# Patient Record
Sex: Male | Born: 1955
Health system: Southern US, Community
[De-identification: ages and names within clinical notes are randomized; demographics above are authoritative.]

## PROBLEM LIST (undated history)

## (undated) DIAGNOSIS — E119 Type 2 diabetes mellitus without complications: Secondary | ICD-10-CM

## (undated) DIAGNOSIS — I509 Heart failure, unspecified: Secondary | ICD-10-CM

## (undated) DIAGNOSIS — I10 Essential (primary) hypertension: Secondary | ICD-10-CM

## (undated) DIAGNOSIS — N200 Calculus of kidney: Secondary | ICD-10-CM

## (undated) HISTORY — DX: Essential (primary) hypertension: I10

## (undated) HISTORY — DX: Type 2 diabetes mellitus without complications: E11.9

## (undated) HISTORY — DX: Heart failure, unspecified: I50.9

---

## 2011-08-18 ENCOUNTER — Encounter: Payer: Self-pay | Admitting: Emergency Medicine

## 2011-08-18 ENCOUNTER — Emergency Department (HOSPITAL_COMMUNITY)
Admission: EM | Admit: 2011-08-18 | Discharge: 2011-08-18 | Disposition: A | Discharge status: Home / Self Care | Payer: Self-pay | Attending: Emergency Medicine | Admitting: Emergency Medicine

## 2011-08-18 DIAGNOSIS — Z87442 Personal history of urinary calculi: Secondary | ICD-10-CM | POA: Insufficient documentation

## 2011-08-18 DIAGNOSIS — R112 Nausea with vomiting, unspecified: Secondary | ICD-10-CM | POA: Insufficient documentation

## 2011-08-18 DIAGNOSIS — N23 Unspecified renal colic: Secondary | ICD-10-CM | POA: Insufficient documentation

## 2011-08-18 DIAGNOSIS — R10819 Abdominal tenderness, unspecified site: Secondary | ICD-10-CM | POA: Insufficient documentation

## 2011-08-18 DIAGNOSIS — R109 Unspecified abdominal pain: Secondary | ICD-10-CM | POA: Insufficient documentation

## 2011-08-18 HISTORY — DX: Calculus of kidney: N20.0

## 2011-08-18 LAB — URINALYSIS, ROUTINE W REFLEX MICROSCOPIC
Ketones, ur: 15 mg/dL — AB
Nitrite: NEGATIVE
Protein, ur: NEGATIVE mg/dL
Urobilinogen, UA: 0.2 mg/dL (ref 0.0–1.0)
pH: 5.5 (ref 5.0–8.0)

## 2011-08-18 MED ORDER — ONDANSETRON 8 MG PO TBDP: ORAL_TABLET | ORAL | Status: AC

## 2011-08-18 MED ORDER — KETOROLAC TROMETHAMINE 60 MG/2ML IM SOLN
60.0000 mg | Freq: Once | INTRAMUSCULAR | Status: AC
  Administered 2011-08-18: 60 mg via INTRAMUSCULAR
  Filled 2011-08-18: qty 2

## 2011-08-18 MED ORDER — HYDROMORPHONE HCL 4 MG PO TABS: 4.0000 mg | ORAL_TABLET | ORAL | Status: AC | PRN

## 2011-08-18 MED ORDER — MORPHINE SULFATE 10 MG/ML IJ SOLN
10.0000 mg | Freq: Once | INTRAMUSCULAR | Status: AC
  Administered 2011-08-18: 10 mg via INTRAMUSCULAR
  Filled 2011-08-18: qty 1

## 2011-08-18 MED ORDER — METOCLOPRAMIDE HCL 10 MG PO TABS: 10.0000 mg | ORAL_TABLET | Freq: Four times a day (QID) | ORAL | Status: AC | PRN

## 2011-08-18 MED ORDER — ONDANSETRON 4 MG PO TBDP
8.0000 mg | ORAL_TABLET | Freq: Once | ORAL | Status: AC
  Administered 2011-08-18: 8 mg via ORAL
  Filled 2011-08-18: qty 2

## 2011-08-18 NOTE — ED Notes (Signed)
Pt. Stated, I've had the worse pain on my lt. Side and I know its a kidney stone

## 2011-08-18 NOTE — ED Provider Notes (Signed)
History     CSN: [id number]  Arrival date & time 08/18/11  1312   First MD Initiated Contact with Patient 08/18/11 1622      Chief Complaint  Patient presents with  . Abdominal Pain    (Consider location/radiation/quality/duration/timing/severity/associated sxs/prior treatment) HPI This 56 year old male has about 12 hours of left flank and left lower quadrant abdominal pain with a colicky severe pain sensation just like prior kidney stones. He says nausea and vomiting but does not feel dehydrated. He is no fever chest pain cough or shortness of breath no testicular pain. He has no pain in his arms or legs no weakness or numbness. This feels just like his prior kidney stones. There is no treatment prior to arrival. Past Medical History  Diagnosis Date  . Kidney calculi     No past surgical history on file.  No family history on file.  History  Substance Use Topics  . Smoking status: Not on file  . Smokeless tobacco: Not on file  . Alcohol Use:       Review of Systems  Constitutional: Negative for fever.       10 Systems reviewed and are negative for acute change except as noted in the HPI.  HENT: Negative for congestion.   Eyes: Negative for discharge and redness.  Respiratory: Negative for cough and shortness of breath.   Cardiovascular: Negative for chest pain.  Gastrointestinal: Positive for nausea, vomiting and abdominal pain. Negative for diarrhea.  Genitourinary: Positive for flank pain. Negative for dysuria and hematuria.  Musculoskeletal: Negative for back pain.  Skin: Negative for rash.  Neurological: Negative for syncope, numbness and headaches.  Psychiatric/Behavioral:       No behavior change.    Allergies  Codeine and Erythromycin  Home Medications   Current Outpatient Rx  Name Route Sig Dispense Refill  . HYDROMORPHONE HCL 4 MG PO TABS Oral Take 1 tablet (4 mg total) by mouth every 4 (four) hours as needed for pain. 10 tablet 0  . METOCLOPRAMIDE  HCL 10 MG PO TABS Oral Take 1 tablet (10 mg total) by mouth every 6 (six) hours as needed (nausea/headache). 6 tablet 0  . ONDANSETRON 8 MG PO TBDP  8mg  ODT q4 hours prn nausea 4 tablet 0    BP 154/99  Pulse 76  Temp(Src) 97.9 F (36.6 C) (Oral)  Resp 18  SpO2 96%  Physical Exam  Nursing note and vitals reviewed. Constitutional:       Awake, alert, nontoxic appearance.  HENT:  Head: Atraumatic.  Eyes: Right eye exhibits no discharge. Left eye exhibits no discharge.  Neck: Neck supple.  Cardiovascular: Normal rate and regular rhythm.   No murmur heard. Pulmonary/Chest: Effort normal and breath sounds normal. No respiratory distress. He has no wheezes. He has no rales. He exhibits no tenderness.  Abdominal: Soft. Bowel sounds are normal. There is tenderness. There is no rebound.       Mild left lower quadrant tenderness without rebound and mild left CVA tenderness  Musculoskeletal: He exhibits no edema and no tenderness.       Baseline ROM, no obvious new focal weakness.  Neurological:       Mental status and motor strength appears baseline for patient and situation.  Skin: No rash noted.  Psychiatric: He has a normal mood and affect.    ED Course  Procedures (including critical care time)  Labs Reviewed  URINALYSIS, ROUTINE W REFLEX MICROSCOPIC - Abnormal; Notable for the following:  Specific Gravity, Urine 1.036 (*)    Glucose, UA 100 (*)    Hgb urine dipstick LARGE (*)    Bilirubin Urine SMALL (*)    Ketones, ur 15 (*)    Leukocytes, UA TRACE (*)    All other components within normal limits  URINE MICROSCOPIC-ADD ON   No results found.   1. Renal colic       MDM  Pt feels improved after observation and/or treatment in ED.Patient / Family / Caregiver informed of clinical course, understand medical decision-making process, and agree with plan.I doubt any other EMC precluding discharge at this time including, but not necessarily limited to the following:SBI,  AAA.        Hurman Horn, MD 08/18/11 [phone]

## 2011-08-19 ENCOUNTER — Encounter (HOSPITAL_COMMUNITY): Payer: Self-pay | Admitting: Emergency Medicine

## 2011-08-22 ENCOUNTER — Ambulatory Visit (HOSPITAL_COMMUNITY)
Admission: RE | Admit: 2011-08-22 | Discharge: 2011-08-22 | Disposition: A | Discharge status: Home / Self Care | Payer: Self-pay | Source: Ambulatory Visit | Attending: Urology | Admitting: Urology

## 2011-08-22 ENCOUNTER — Other Ambulatory Visit (HOSPITAL_COMMUNITY): Payer: Self-pay | Admitting: Urology

## 2011-08-22 DIAGNOSIS — N200 Calculus of kidney: Secondary | ICD-10-CM

## 2011-08-22 DIAGNOSIS — K8020 Calculus of gallbladder without cholecystitis without obstruction: Secondary | ICD-10-CM | POA: Insufficient documentation

## 2011-08-22 DIAGNOSIS — N1330 Unspecified hydronephrosis: Secondary | ICD-10-CM | POA: Insufficient documentation

## 2011-08-22 DIAGNOSIS — R109 Unspecified abdominal pain: Secondary | ICD-10-CM | POA: Insufficient documentation

## 2011-08-22 DIAGNOSIS — K5730 Diverticulosis of large intestine without perforation or abscess without bleeding: Secondary | ICD-10-CM | POA: Insufficient documentation

## 2011-08-22 DIAGNOSIS — R110 Nausea: Secondary | ICD-10-CM | POA: Insufficient documentation

## 2011-08-22 DIAGNOSIS — K449 Diaphragmatic hernia without obstruction or gangrene: Secondary | ICD-10-CM | POA: Insufficient documentation

## 2011-08-22 DIAGNOSIS — N201 Calculus of ureter: Secondary | ICD-10-CM | POA: Insufficient documentation

## 2011-08-22 DIAGNOSIS — R319 Hematuria, unspecified: Secondary | ICD-10-CM | POA: Insufficient documentation

## 2011-11-15 ENCOUNTER — Ambulatory Visit: Payer: Self-pay | Admitting: Emergency Medicine

## 2011-11-15 VITALS — BP 151/88 | HR 77 | Temp 98.2°F | Resp 18 | Ht 67.5 in | Wt 192.6 lb

## 2011-11-15 DIAGNOSIS — N200 Calculus of kidney: Secondary | ICD-10-CM

## 2011-11-15 DIAGNOSIS — K115 Sialolithiasis: Secondary | ICD-10-CM

## 2011-11-15 DIAGNOSIS — R300 Dysuria: Secondary | ICD-10-CM

## 2011-11-15 LAB — POCT URINALYSIS DIPSTICK
Bilirubin, UA: NEGATIVE
Glucose, UA: NEGATIVE
Ketones, UA: NEGATIVE
Spec Grav, UA: 1.02
pH, UA: 6

## 2011-11-15 LAB — POCT UA - MICROSCOPIC ONLY
Casts, Ur, LPF, POC: NEGATIVE
Crystals, Ur, HPF, POC: NEGATIVE
Mucus, UA: NEGATIVE

## 2011-11-15 LAB — COMPREHENSIVE METABOLIC PANEL
ALT: 23 U/L (ref 0–53)
Albumin: 4.4 g/dL (ref 3.5–5.2)
Alkaline Phosphatase: 80 U/L (ref 39–117)
Glucose, Bld: 118 mg/dL — ABNORMAL HIGH (ref 70–99)
Potassium: 4.1 mEq/L (ref 3.5–5.3)
Sodium: 137 mEq/L (ref 135–145)
Total Bilirubin: 1.5 mg/dL — ABNORMAL HIGH (ref 0.3–1.2)
Total Protein: 6.7 g/dL (ref 6.0–8.3)

## 2011-11-15 LAB — POCT CBC
Granulocyte percent: 61.5 %G (ref 37–80)
HCT, POC: 50.1 % (ref 43.5–53.7)
Hemoglobin: 17 g/dL (ref 14.1–18.1)
MCHC: 33.9 g/dL (ref 31.8–35.4)
MPV: 8.7 fL (ref 0–99.8)
POC Granulocyte: 4.2 (ref 2–6.9)
POC LYMPH PERCENT: 31.3 %L (ref 10–50)
POC MID %: 7.2 %M (ref 0–12)
RDW, POC: 13.5 %

## 2011-11-15 MED ORDER — DOXYCYCLINE HYCLATE 100 MG PO TABS: 100.0000 mg | ORAL_TABLET | Freq: Two times a day (BID) | ORAL | Status: AC

## 2011-11-15 NOTE — Progress Notes (Signed)
  Subjective:    Patient ID: Kyle Ramsey, male    DOB: January 12, 1956, 56 y.o.   MRN: [id number]  HPI patient presents with onset last night of severe swelling in the right side of his neck. He cerevisiae and then developed acute onset of severe swelling. This was associated with pain. It may be slightly less swollen today but still uncomfortable.    Review of Systems pertinent history reveals the patient has a history of kidney stones. He's been seen by the urologist for this.     Objective:   Physical Exam  Constitutional: He appears well-developed and well-nourished.  HENT:       The TMs are normal. The right salivary gland is significantly enlarged  and tender. Underneath the tongue appears normal. No stone was palpable  Neck: No JVD present. No tracheal deviation present. No thyromegaly present.  Cardiovascular: Normal rate and regular rhythm.   Pulmonary/Chest: No respiratory distress. He has no wheezes. He has no rales. He exhibits no tenderness.  Genitourinary: Rectum normal and prostate normal.  Lymphadenopathy:    He has no cervical adenopathy.          Assessment & Plan:   Patient here with urinary symptoms as well as a stone in the right submaxillary gland. We'll need to check a serum calcium because of the possibility of hyperparathyroidism.

## 2011-11-15 NOTE — Patient Instructions (Signed)
Salivary Stone  Your exam shows you have a stone in one of your saliva glands. These small stones form around a mucous plug in the ducts of the glands and cause the saliva in the gland to be blocked. This makes the gland swollen and painful, especially when you eat. If repeated episodes occur, the gland can become infected. Sometimes these stones can be seen on x-ray.  Treatment includes stimulating the production of saliva to push the stone out. You should suck on a lemon or sour candies several times daily. Antibiotic medicine may be needed if the gland is infected. Increasing fluids, applying warm compresses to the swollen area 3-4 times daily, and massaging the gland from back to front may encourage drainage and passage of the stone.  Surgical treatment to remove the stone is sometimes necessary, so proper medical follow up is very important. Call your doctor for an appointment as recommended. Call right away if you have a high fever, severe headache, vomiting, uncontrolled pain, or other serious symptoms.  Document Released: 09/06/2004 Document Revised: 07/19/2011 Document Reviewed: 07/30/2005  Exit[employer] Patient Information 2012 [employer].

## 2011-11-16 LAB — GC/CHLAMYDIA PROBE AMP, URINE
Chlamydia, Swab/Urine, PCR: NEGATIVE
GC Probe Amp, Urine: NEGATIVE

## 2011-11-16 LAB — PTH, INTACT AND CALCIUM: Calcium, Total (PTH): 9.3 mg/dL (ref 8.4–10.5)

## 2011-11-17 ENCOUNTER — Other Ambulatory Visit: Payer: Self-pay | Admitting: Emergency Medicine

## 2011-11-17 DIAGNOSIS — K115 Sialolithiasis: Secondary | ICD-10-CM

## 2011-11-18 ENCOUNTER — Telehealth: Payer: Self-pay

## 2011-11-18 NOTE — Telephone Encounter (Signed)
Dr.Daub: Pt would like to let you know that his salvia gland stone came out this yesterday.

## 2011-11-19 NOTE — Telephone Encounter (Signed)
Call that is great no further treatment necessary

## 2011-11-19 NOTE — Telephone Encounter (Signed)
LMOM for pt with Dr Ellis Parents message

## 2012-02-18 ENCOUNTER — Telehealth: Payer: Self-pay

## 2012-02-18 ENCOUNTER — Other Ambulatory Visit: Payer: Self-pay | Admitting: Emergency Medicine

## 2012-02-18 NOTE — Telephone Encounter (Signed)
The patient called to request another Rx for doxycycline for urinary tract infection.  The patient stated that he recently passed two kidney stones and is very uncomfortable and would like Rx sent to [place] [facility].  Please call patient at [phone].

## 2012-02-18 NOTE — Telephone Encounter (Signed)
Needs office visit.

## 2012-02-18 NOTE — Telephone Encounter (Signed)
Patient would need office visit first before meds can be given--correct?

## 2012-02-19 NOTE — Telephone Encounter (Signed)
PT STATES HE CANNOT AFFORD TO COME IN AND HE WILL HAVE TO FIGURE OUT ANOTHER OPTION.

## 2012-03-17 ENCOUNTER — Encounter: Payer: Self-pay | Admitting: Emergency Medicine

## 2019-06-14 ENCOUNTER — Ambulatory Visit (HOSPITAL_COMMUNITY)
Admission: EM | Admit: 2019-06-14 | Discharge: 2019-06-14 | Disposition: A | Discharge status: Home / Self Care | Payer: Self-pay | Attending: Internal Medicine | Admitting: Internal Medicine

## 2019-06-14 ENCOUNTER — Other Ambulatory Visit: Payer: Self-pay

## 2019-06-14 ENCOUNTER — Encounter (HOSPITAL_COMMUNITY): Payer: Self-pay

## 2019-06-14 DIAGNOSIS — J0100 Acute maxillary sinusitis, unspecified: Secondary | ICD-10-CM

## 2019-06-14 MED ORDER — FLUTICASONE PROPIONATE 50 MCG/ACT NA SUSP: 1.0000 | Freq: Every day | NASAL | 0 refills | Status: DC

## 2019-06-14 MED ORDER — KETOROLAC TROMETHAMINE 30 MG/ML IJ SOLN
30.0000 mg | Freq: Once | INTRAMUSCULAR | Status: AC
  Administered 2019-06-14: 30 mg via INTRAMUSCULAR

## 2019-06-14 MED ORDER — IBUPROFEN 600 MG PO TABS: 600.0000 mg | ORAL_TABLET | Freq: Four times a day (QID) | ORAL | 0 refills | Status: DC | PRN

## 2019-06-14 MED ORDER — KETOROLAC TROMETHAMINE 30 MG/ML IJ SOLN
30.0000 mg | Freq: Once | INTRAMUSCULAR | Status: DC
Start: 2019-06-14 — End: 2019-06-14

## 2019-06-14 MED ORDER — KETOROLAC TROMETHAMINE 30 MG/ML IJ SOLN
INTRAMUSCULAR | Status: AC
  Filled 2019-06-14: qty 1

## 2019-06-14 NOTE — ED Triage Notes (Signed)
Pt states he has sinus pain on the right side of his face. X 3 days.

## 2019-06-14 NOTE — ED Provider Notes (Addendum)
[place]    CSN: [id number] Arrival date & time: 06/14/19  1543      History   Chief Complaint Chief Complaint  Patient presents with  . Facial Pain    HPI Kyle Ramsey is a 63 y.o. male with a history of tobacco use comes to urgent care with 3-day history of right-sided facial pain.  Symptoms started 3 days ago and has been persistent.  Pain is constant.  It is associated with periorbital headache on the right side.  Relieved with Tylenol.  Patient denies any nasal discharge.  Denies cough or sputum production.  No ear pain or ringing.  Patient has seasonal allergies but currently not taking any allergy medications.  No nausea/vomiting/fever/chills.  HPI  Past Medical History:  Diagnosis Date  . Kidney calculi     There are no active problems to display for this patient.   History reviewed. No pertinent surgical history.     Home Medications    Prior to Admission medications   Medication Sig Start Date End Date Taking? Authorizing Provider  fluticasone (FLONASE) 50 MCG/ACT nasal spray Place 1 spray into both nostrils daily. 06/14/19   LampteyMyrene Galas, MD  ibuprofen (ADVIL) 600 MG tablet Take 1 tablet (600 mg total) by mouth every 6 (six) hours as needed. 06/14/19   LampteyMyrene Galas, MD    Family History No family history on file.  Social History Social History   Tobacco Use  . Smoking status: Current Every Day Smoker    Packs/day: 1.00  Substance Use Topics  . Alcohol use: Yes  . Drug use: Not on file     Allergies   Codeine and Erythromycin   Review of Systems Review of Systems  Constitutional: Negative for activity change, chills, fatigue and fever.  HENT: Positive for sinus pressure and sinus pain. Negative for congestion, drooling, ear discharge, ear pain, facial swelling, hearing loss, mouth sores, postnasal drip, sore throat and tinnitus.   Eyes: Negative.   Respiratory: Negative for apnea, cough, shortness of breath and  wheezing.   Cardiovascular: Negative.   Gastrointestinal: Negative.   Neurological: Negative.  Negative for dizziness, facial asymmetry, light-headedness and headaches.     Physical Exam Triage Vital Signs ED Triage Vitals  Enc Vitals Group     BP 06/14/19 1605 (!) 194/103     Pulse Rate 06/14/19 1605 (!) 103     Resp 06/14/19 1605 18     Temp 06/14/19 1605 98.4 F (36.9 C)     Temp Source 06/14/19 1605 Oral     SpO2 06/14/19 1605 99 %     Weight 06/14/19 1602 165 lb (74.8 kg)     Height --      Head Circumference --      Peak Flow --      Pain Score 06/14/19 1602 8     Pain Loc --      Pain Edu? --      Excl. in [place]? --    No data found.  Updated Vital Signs BP (!) 194/103 (BP Location: Left Arm)   Pulse (!) 103   Temp 98.4 F (36.9 C) (Oral)   Resp 18   Wt 74.8 kg   SpO2 99%   BMI 25.46 kg/m   Visual Acuity Right Eye Distance:   Left Eye Distance:   Bilateral Distance:    Right Eye Near:   Left Eye Near:    Bilateral Near:     Physical Exam  Constitutional:      General: He is not in acute distress.    Appearance: Normal appearance. He is not ill-appearing or toxic-appearing.  HENT:     Right Ear: Tympanic membrane normal.     Left Ear: Tympanic membrane normal.     Nose: Nose normal. No congestion or rhinorrhea.     Mouth/Throat:     Mouth: Mucous membranes are moist.     Pharynx: Oropharynx is clear. No oropharyngeal exudate or posterior oropharyngeal erythema.  Eyes:     General:        Right eye: No discharge.        Left eye: No discharge.     Conjunctiva/sclera: Conjunctivae normal.  Neck:     Musculoskeletal: Normal range of motion and neck supple.  Cardiovascular:     Rate and Rhythm: Normal rate and regular rhythm.     Pulses: Normal pulses.  Pulmonary:     Effort: Pulmonary effort is normal. No respiratory distress.     Breath sounds: Normal breath sounds. No rhonchi or rales.  Abdominal:     General: Bowel sounds are normal.      Palpations: Abdomen is soft.  Skin:    Capillary Refill: Capillary refill takes less than 2 seconds.  Neurological:     General: No focal deficit present.     Mental Status: He is alert and oriented to person, place, and time.      UC Treatments / Results  Labs (all labs ordered are listed, but only abnormal results are displayed) Labs Reviewed - No data to display  EKG   Radiology No results found.  Procedures Procedures (including critical care time)  Medications Ordered in UC Medications  ketorolac (TORADOL) 30 MG/ML injection 30 mg (30 mg Intramuscular Given 06/14/19 1621)  ketorolac (TORADOL) 30 MG/ML injection (has no administration in time range)    Initial Impression / Assessment and Plan / UC Course  I have reviewed the triage vital signs and the nursing notes.  Pertinent labs & imaging results that were available during my care of the patient were reviewed by me and considered in my medical decision making (see chart for details).     1.  Acute sinusitis: Patient is encouraged to resume over-the-counter allergy medications Fluticasone nasal spray Saline nasal spray as needed Toradol 30 mg IM x1 for severe pain-pain is currently 8 out of 10 Ibuprofen 600 mg every 6 hours as needed for pain/fever. Patient requested oral antibiotics.  I explained to the patient that his symptoms have been ongoing for the past few days.  Typically viral sinusitis improve by day 5-7.  If patient's symptoms worsen after a week, he may benefit from antibiotics.  At this time there is no indication for antibiotics.  Patient was not happy with the discussion but he was agreeable. Final Clinical Impressions(s) / UC Diagnoses   Final diagnoses:  Acute non-recurrent maxillary sinusitis   Discharge Instructions   None    ED Prescriptions    Medication Sig Dispense Auth. Provider   fluticasone (FLONASE) 50 MCG/ACT nasal spray Place 1 spray into both nostrils daily. 16 g Chase Picket, MD   ibuprofen (ADVIL) 600 MG tablet Take 1 tablet (600 mg total) by mouth every 6 (six) hours as needed. 30 tablet Jessilyn Catino, Myrene Galas, MD     PDMP not reviewed this encounter.   Chase Picket, MD 06/14/19 1629    Chase Picket, MD 06/14/19 [phone]

## 2019-06-15 ENCOUNTER — Encounter (HOSPITAL_COMMUNITY): Payer: Self-pay

## 2019-06-15 ENCOUNTER — Ambulatory Visit (HOSPITAL_COMMUNITY)
Admission: EM | Admit: 2019-06-15 | Discharge: 2019-06-15 | Disposition: A | Discharge status: Home / Self Care | Payer: Self-pay | Attending: Emergency Medicine | Admitting: Emergency Medicine

## 2019-06-15 DIAGNOSIS — J0100 Acute maxillary sinusitis, unspecified: Secondary | ICD-10-CM

## 2019-06-15 DIAGNOSIS — R030 Elevated blood-pressure reading, without diagnosis of hypertension: Secondary | ICD-10-CM

## 2019-06-15 MED ORDER — AMOXICILLIN-POT CLAVULANATE 875-125 MG PO TABS: 1.0000 | ORAL_TABLET | Freq: Two times a day (BID) | ORAL | 0 refills | Status: DC

## 2019-06-15 NOTE — Discharge Instructions (Addendum)
Continue Flonase, ibuprofen as needed.  The Flonase will help with the inflammation.  Start Mucinex to keep the mucous thin so that you can wash and blow it out.  Return to the ER if you get worse, have a fever >100.4, or for any concerns. You may take 600 mg of motrin with 1 gram of tylenol up to 3-4 times a day as needed for pain. This is an effective combination for pain.  Most sinus infections are viral and do not need antibiotics unless you have a high fever, have had this for 10 days, or you get better and then get sick again.  use a NeilMed sinus rinse with distilled water as often as you want to to reduce nasal congestion. Follow the directions on the box.   I would give Mucinex, Flonase, saline nasal irrigation a few days to work, however I am giving you prescription of Augmentin which works very well for sinus infections in case you do not respond to this.  Decrease your salt intake. diet and exercise will lower your blood pressure significantly. It is important to keep your blood pressure under good control, as having a elevated blood pressure for prolonged periods of time significantly increases your risk of stroke, heart attacks, kidney damage, eye damage, and other problems. Measure your blood pressure once a day, preferably at the same time every day. Keep a log of this and bring it to your next doctor's appointment.  Bring your blood pressure cuff as well.  Return here in a 2 weeks for blood pressure recheck if your blood pressure is consistently above 140/90 and you are unable to find a primary care physician.  Return immediately to the ER if you start having chest pain, headache, problems seeing, problems talking, problems walking, if you feel like you're about to pass out, if you do pass out, if you have a seizure, or for any other concerns.  Below is a list of primary care practices who are taking new patients for you to follow-up with.  [facility] at [facility] [address] [place], [place] [address] [phone]  [place], [place] [address] [phone]  [facility] [phone] [place] [address]  [facility]: [place]  [phone]  [place] and [facility]: [place]   [phone]  [facility]: [address] [place]  [phone]  [place] [facility] : [address] [phone]  [facility]: [address] [place] [phone]  [facility]: [place] [phone]  Dr. Blanchie Serve [place]  [phone]  Dr. Benito Mccreedy, [facility]. [place],  [address]  [phone]  Go to www.goodrx.com to look up your medications. This will give you a list of where you can find your prescriptions at the most affordable prices. Or ask the pharmacist what the cash price is, or if they have any other discount programs available to help make your medication more affordable. This can be less expensive than what you would pay with insurance.    Go to www.goodrx.com to look up  your medications. This will give you a list of where you can find your prescriptions at the most affordable prices. Or you can ask the pharmacist what the cash price is. This is frequently cheaper than going through insurance.

## 2019-06-15 NOTE — ED Triage Notes (Signed)
Patient presents to Urgent Care with complaints of continued sinus pressure since 4 days ago. Patient reports he was given ibuprofen yesterday and he is not satisfied. Pt is convinced he needs an oral antibiotic today, demands " I do not have a cold, it is not viral".

## 2019-06-15 NOTE — ED Provider Notes (Signed)
HPI  SUBJECTIVE:  Kyle Ramsey is a 63 y.o. male who presents with 5 days of right sided maxillary sinus pain and pressure, facial swelling, upper dental pain.  Reports eye pain, periorbital right-sided headache.  He reports clear rhinorrhea mixed with blood.  No fevers, allergies, purulent nasal congestion, antibiotics in the past month.  He took ibuprofen 600 mg prior to arrival.  He states this is identical to previous sinusitis which he gets frequently.   Denies sore throat, postnasal drip, cough.  He is not using any decongestions.  He tried Flonase, ibuprofen 600 mg.  The ibuprofen helps.  No aggravating factors. He is requesting a prescription of antibiotics.  He has a past medical history of frequent sinusitis, allergies, hypertension "when he gets sick" only.  No history of diabetes.  PMD: None.  Patient was seen here yesterday for right-sided facial pain, periorbital headache.  Thought to have a viral sinusitis given Toradol, sent home with ibuprofen and Flonase.  There were no clear indications for antibiotics.  Past Medical History:  Diagnosis Date  . Kidney calculi     History reviewed. No pertinent surgical history.  History reviewed. No pertinent family history.  Social History   Tobacco Use  . Smoking status: Current Every Day Smoker    Packs/day: 1.00  . Smokeless tobacco: Never Used  Substance Use Topics  . Alcohol use: Yes  . Drug use: Not on file    No current facility-administered medications for this encounter.   Current Outpatient Medications:  .  amoxicillin-clavulanate (AUGMENTIN) 875-125 MG tablet, Take 1 tablet by mouth 2 (two) times daily. X 7 days, Disp: 14 tablet, Rfl: 0 .  fluticasone (FLONASE) 50 MCG/ACT nasal spray, Place 1 spray into both nostrils daily., Disp: 16 g, Rfl: 0 .  ibuprofen (ADVIL) 600 MG tablet, Take 1 tablet (600 mg total) by mouth every 6 (six) hours as needed., Disp: 30 tablet, Rfl: 0  Allergies  Allergen Reactions  . Codeine  Nausea And Vomiting  . Erythromycin Other (See Comments)    Gall bladder problem     ROS  As noted in HPI.   Physical Exam  BP (!) 191/99 (BP Location: Right Arm)   Pulse 93   Temp 98.9 F (37.2 C) (Oral)   Resp 16   SpO2 97%   Constitutional: Well developed, well nourished, no acute distress Eyes:  EOMI, conjunctiva normal bilaterally HENT: Normocephalic, atraumatic,mucus membranes moist.  Purulent right-sided nasal congestion with erythematous, swollen turbinates.  Positive right-sided maxillary sinus tenderness.  No left maxillary sinus tenderness.  No frontal sinus tenderness.  No obvious postnasal drip. Respiratory: Normal inspiratory effort Cardiovascular: Normal rate GI: nondistended skin: No rash, skin intact Musculoskeletal: no deformities Neurologic: Alert & oriented x 3, no focal neuro deficits Psychiatric: Speech and behavior appropriate   ED Course   Medications - No data to display  No orders of the defined types were placed in this encounter.   No results found for this or any previous visit (from the past 24 hour(s)). No results found.  ED Clinical Impression  1. Acute non-recurrent maxillary sinusitis   2. Elevated blood pressure reading      ED Assessment/Plan  Previous records reviewed.  As noted in HPI.  1.  Right maxillary sinusitis.  discussed with patient that he has no clear indications for antibiotics, however will send home with a wait-and-see prescription of Augmentin.  Encouraged him to try saline nasal irrigation, Mucinex, continue Flonase and ibuprofen for several  days prior to starting the Augmentin.  He is agreeable with this.  2.  Elevated blood pressure.   Patient denies any symptoms. Pt denies any CNS type sx such as severe HA, visual changes, focal paresis, or new onset seizure activity. Pt denies any CV sx such as CP, dyspnea, palpitations, pedal edema, tearing pain radiating to back or abd. Pt denied any renal sx such as  anuria or hematuria. Pt denies recent use of OTC medications such as nasal decongestants.  will have patient buy a blood pressure cuff and keep a log of his blood pressure and follow-up with a primary care physician of his choice.  Providing primary care list.  Emphasized importance of having a primary care provider.  May come back here in 2 weeks if his blood pressure remains consistently elevated.  Consider starting him on medication at that time.  Discussed MDM, treatment plan, and plan for follow-up with patient. Discussed sn/sx that should prompt return to the ED. patient agrees with plan.   Meds ordered this encounter  Medications  . amoxicillin-clavulanate (AUGMENTIN) 875-125 MG tablet    Sig: Take 1 tablet by mouth 2 (two) times daily. X 7 days    Dispense:  14 tablet    Refill:  0    *This clinic note was created using [employer]. Therefore, there may be occasional mistakes despite careful proofreading.   ?    Melynda Ripple, MD 06/16/19 [phone]

## 2019-11-19 ENCOUNTER — Ambulatory Visit: Payer: Self-pay | Attending: Internal Medicine

## 2019-11-19 DIAGNOSIS — Z23 Encounter for immunization: Secondary | ICD-10-CM

## 2019-11-19 NOTE — Progress Notes (Signed)
   Covid-19 Vaccination Clinic  Name:  Kyle Ramsey    MRN: [id number] DOB: 1955/12/22  11/19/2019  Kyle Ramsey was observed post Covid-19 immunization for 15 minutes without incident. He was provided with Vaccine Information Sheet and instruction to access the V-Safe system.   Kyle Ramsey was instructed to call 911 with any severe reactions post vaccine: Marland Kitchen Difficulty breathing  . Swelling of face and throat  . A fast heartbeat  . A bad rash all over body  . Dizziness and weakness   Immunizations Administered    Name Date Dose VIS Date Route   Pfizer COVID-19 Vaccine 11/19/2019 10:55 AM 0.3 mL 07/24/2019 Intramuscular   Manufacturer: [place]   Lot: [id number]   [place]: [id number]

## 2019-12-14 ENCOUNTER — Ambulatory Visit: Payer: Self-pay

## 2019-12-16 ENCOUNTER — Ambulatory Visit: Payer: Self-pay

## 2019-12-23 ENCOUNTER — Ambulatory Visit: Payer: Self-pay

## 2019-12-26 ENCOUNTER — Ambulatory Visit: Payer: Self-pay | Attending: Internal Medicine

## 2019-12-26 DIAGNOSIS — Z23 Encounter for immunization: Secondary | ICD-10-CM

## 2019-12-26 NOTE — Progress Notes (Signed)
   Covid-19 Vaccination Clinic  Name:  Kyle Ramsey    MRN: [id number] DOB: [birth year]  12/26/2019  Mr. Kyle Ramsey was observed post Covid-19 immunization for 15 minutes without incident. He was provided with Vaccine Information Sheet and instruction to access the V-Safe system.   Mr. Kyle Ramsey was instructed to call 911 with any severe reactions post vaccine: Marland Kitchen Difficulty breathing  . Swelling of face and throat  . A fast heartbeat  . A bad rash all over body  . Dizziness and weakness   Immunizations Administered    Name Date Dose VIS Date Route   Pfizer COVID-19 Vaccine 12/26/2019 11:39 AM 0.3 mL 10/07/2018 Intramuscular   Manufacturer: [place]   Lot: [id number]   [place]: [id number]

## 2020-02-25 ENCOUNTER — Encounter: Payer: Medicare Other | Attending: Internal Medicine | Admitting: Dietician

## 2020-02-25 ENCOUNTER — Other Ambulatory Visit: Payer: Self-pay

## 2020-02-25 ENCOUNTER — Encounter: Payer: Self-pay | Admitting: Dietician

## 2020-02-25 DIAGNOSIS — E1142 Type 2 diabetes mellitus with diabetic polyneuropathy: Secondary | ICD-10-CM | POA: Insufficient documentation

## 2020-02-25 DIAGNOSIS — E119 Type 2 diabetes mellitus without complications: Secondary | ICD-10-CM | POA: Diagnosis not present

## 2020-02-25 DIAGNOSIS — E1169 Type 2 diabetes mellitus with other specified complication: Secondary | ICD-10-CM | POA: Insufficient documentation

## 2020-02-25 NOTE — Progress Notes (Signed)
Diabetes Self-Management Education  Visit Type: First/Initial  Appt. Start Time: 2:50pm   Appt. End Time: 3:30pm  02/25/2020  Mr. Kyle Ramsey, identified by name and date of birth, is a 64 y.o. male with a diagnosis of Diabetes: Type 2.   ASSESSMENT  Patient states he is having negative side effects from taking metformin, including not feeling well, leg pain, and low blood sugars. States he takes half the does or will skip doses if he does not feel well.   Typical meal pattern is 3 meals per day, usually does not snack. States he does not have much of an appetite other than in the morning, but eats to prevent from losing more weight (states he lost ~30 lbs over the past 4 months.) States he currently tries to avoid bread and potatoes, and started avoiding these even before finding out he had diabetes because he suspected he already had it. Does not drink sweetened beverages and has cut out beer. States he used to drink beer to avoid kidney stones, and that he currently drinks water or unsweetened tea with lemon juice to avoid kidney stones.    Diabetes Self-Management Education - 02/25/20 1456      Visit Information   Visit Type First/Initial      Initial Visit   Diabetes Type Type 2    Are you currently following a meal plan? Yes    Are you taking your medications as prescribed? No   pt states he has adverse side effects from taking Metformin and it makes his blood sugar drop too low, so he takes half or skips doses   Date Diagnosed 01/13/2020      Psychosocial Assessment   Self-care barriers Hard of hearing    Patient Concerns Glycemic Control    Special Needs None    Preferred Learning Style No preference indicated    Learning Readiness Ready    What is the last grade level you completed in school? 66AY      Complications   Last HgB A1C per patient/outside source 11.8 %   01/13/2020   How often do you check your blood sugar? 3-4 times/day    Fasting Blood glucose range (mg/dL)  130-179    Postprandial Blood glucose range (mg/dL) 130-179;180-200      Dietary Intake   Breakfast Raisin Bran + lactaid milk    Snack (morning) -    Lunch chicken salad   or beef brisket + green beans + applesauce   Snack (afternoon) -    Dinner fish + vegetables    Snack (evening) sugar-free cookies    Beverage(s) water with lemon, unsweet tea with lemon      Patient Education   Previous Diabetes Education No    Disease state  Definition of diabetes, type 1 and 2, and the diagnosis of diabetes;Factors that contribute to the development of diabetes    Nutrition management  Role of diet in the treatment of diabetes and the relationship between the three main macronutrients and blood glucose level;Reviewed blood glucose goals for pre and post meals and how to evaluate the patients' food intake on their blood glucose level.;Effects of alcohol on blood glucose and safety factors with consumption of alcohol.;Meal options for control of blood glucose level and chronic complications.    Monitoring Taught/evaluated SMBG meter.;Purpose and frequency of SMBG.;Identified appropriate SMBG and/or A1C goals.    Acute complications Taught treatment of hypoglycemia - the 15 rule.;Discussed and identified patients' treatment of hyperglycemia.  Psychosocial adjustment Role of stress on diabetes      Individualized Goals (developed by patient)   Nutrition General guidelines for healthy choices and portions discussed    Monitoring  test my blood glucose as discussed   fasting and 2 hours after meal     Outcomes   Expected Outcomes Demonstrated interest in learning. Expect positive outcomes    Future DMSE PRN           Individualized Plan for Diabetes Self-Management Training:  Learning Objective:  Patient will have a greater understanding of diabetes self-management. Patient education plan is to attend individual and/or group sessions per assessed needs and concerns.   Plan:  Patient  Instructions  It was nice meeting you today!   Remember the main goals discussed today:   Eat at least 3 times per day  For meals, try to include vegetables, carbohydrates (about 3 servings), and protein  For snacks, try to include a serving of carbohydrates and balance it with protein    Expected Outcomes:  Demonstrated interest in learning. Expect positive outcomes  Education material provided: ADA - How to Thrive: A Guide for Your Journey with Diabetes, MyPlate, Meal Ideas, Balanced Snacks  If problems or questions, patient to contact team via:  Phone and Email  Future DSME appointment: PRN

## 2020-02-25 NOTE — Patient Instructions (Signed)
It was nice meeting you today!   Remember the main goals discussed today:   Eat at least 3 times per day  For meals, try to include vegetables, carbohydrates (about 3 servings), and protein  For snacks, try to include a serving of carbohydrates and balance it with protein

## 2020-03-01 DIAGNOSIS — E1165 Type 2 diabetes mellitus with hyperglycemia: Secondary | ICD-10-CM | POA: Diagnosis not present

## 2020-03-01 DIAGNOSIS — G4700 Insomnia, unspecified: Secondary | ICD-10-CM | POA: Diagnosis not present

## 2020-03-01 DIAGNOSIS — R000 Tachycardia, unspecified: Secondary | ICD-10-CM | POA: Diagnosis not present

## 2020-03-01 DIAGNOSIS — I10 Essential (primary) hypertension: Secondary | ICD-10-CM | POA: Diagnosis not present

## 2020-03-01 DIAGNOSIS — R209 Unspecified disturbances of skin sensation: Secondary | ICD-10-CM | POA: Diagnosis not present

## 2020-03-01 DIAGNOSIS — R197 Diarrhea, unspecified: Secondary | ICD-10-CM | POA: Diagnosis not present

## 2020-04-05 ENCOUNTER — Other Ambulatory Visit: Payer: Self-pay | Admitting: Surgery

## 2020-04-06 ENCOUNTER — Other Ambulatory Visit (HOSPITAL_COMMUNITY): Payer: Self-pay | Admitting: Surgery

## 2020-04-06 ENCOUNTER — Other Ambulatory Visit: Payer: Self-pay | Admitting: Surgery

## 2020-04-06 DIAGNOSIS — K4020 Bilateral inguinal hernia, without obstruction or gangrene, not specified as recurrent: Secondary | ICD-10-CM

## 2020-04-08 ENCOUNTER — Ambulatory Visit (HOSPITAL_COMMUNITY)
Admission: RE | Admit: 2020-04-08 | Discharge: 2020-04-08 | Disposition: A | Discharge status: Home / Self Care | Payer: Medicare Other | Source: Ambulatory Visit | Attending: Surgery | Admitting: Surgery

## 2020-04-08 ENCOUNTER — Encounter (HOSPITAL_COMMUNITY): Payer: Self-pay

## 2020-04-08 ENCOUNTER — Other Ambulatory Visit: Payer: Self-pay

## 2020-04-08 DIAGNOSIS — K4020 Bilateral inguinal hernia, without obstruction or gangrene, not specified as recurrent: Secondary | ICD-10-CM | POA: Diagnosis present

## 2020-04-08 LAB — POCT I-STAT CREATININE: Creatinine, Ser: 1 mg/dL (ref 0.61–1.24)

## 2020-04-08 IMAGING — CT CT ABD-PELV W/ CM
2 of 5 series · 16 of 46 positions shown, 18 images · IV contrast (OMNIPAQUE)
Comparison: Noncontrast CT on [DATE]

CLINICAL DATA: 40 lb weight loss.  Anorexia.

EXAM:
CT ABDOMEN AND PELVIS WITH CONTRAST
TECHNIQUE: Multidetector CT imaging of the abdomen and pelvis was performed
using the standard protocol following bolus administration of
intravenous contrast.
CONTRAST:  100mL OMNIPAQUE IOHEXOL 300 MG/ML  SOLN

[Series 2: axial st · axial · 0.78mm/px · z∈[-520,-100]mm · 13 of 98 slices shown, 15 images]
[im 7/98  soft-tissue]
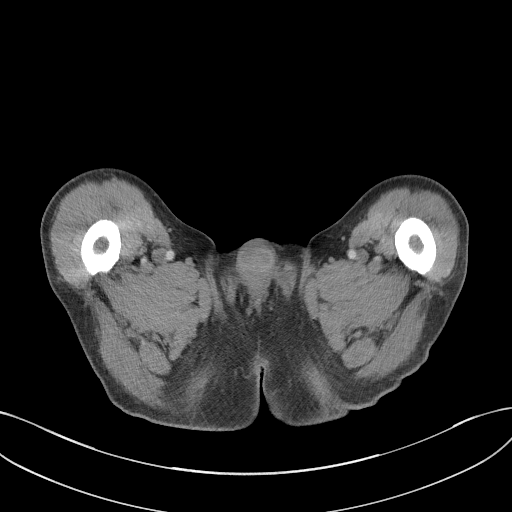
[im 7/98  bone]
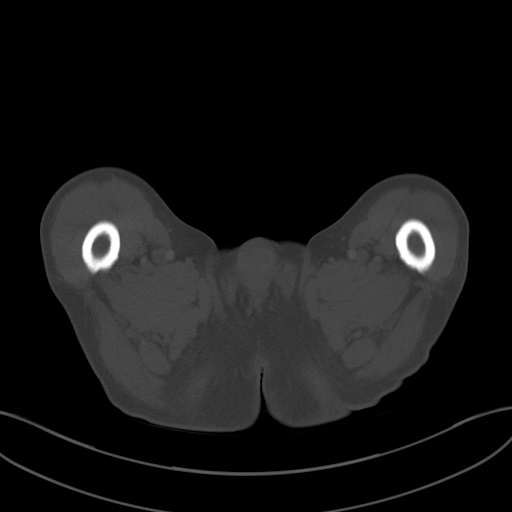
[im 13/98  soft-tissue]
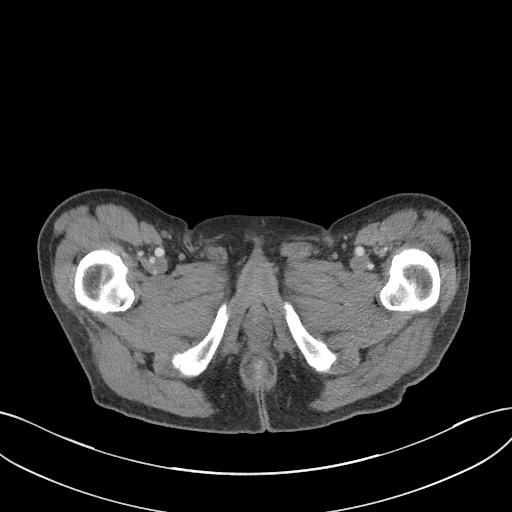
[im 20/98  soft-tissue]
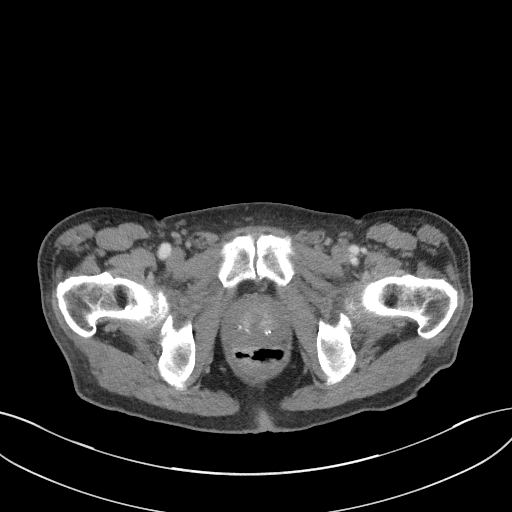
[im 26/98  soft-tissue]
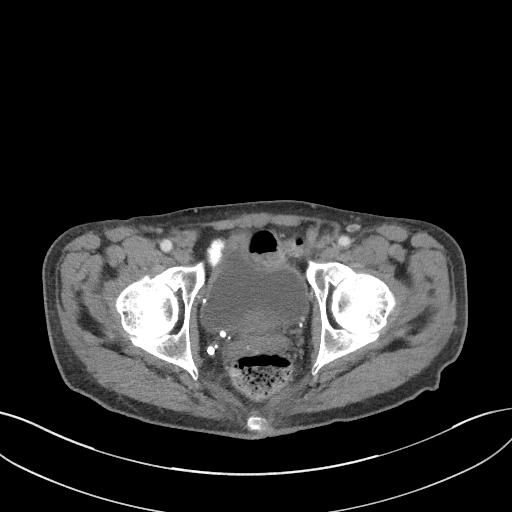
[im 33/98  soft-tissue]
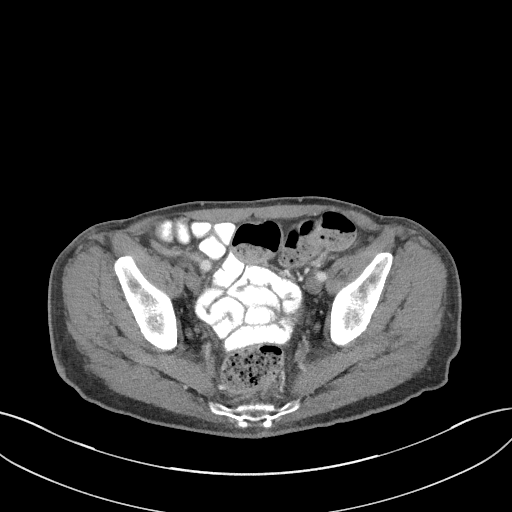
[im 39/98  soft-tissue]
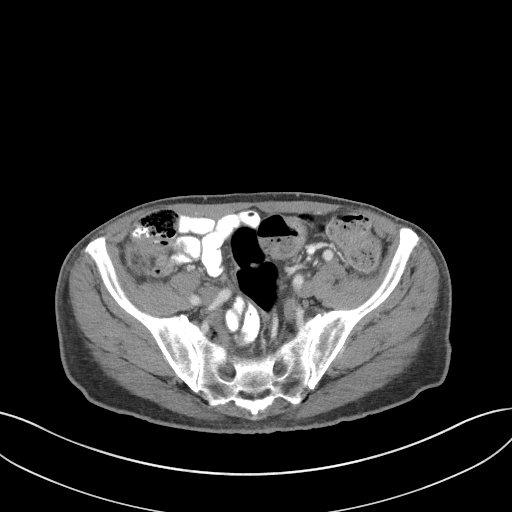
[im 52/98  soft-tissue]
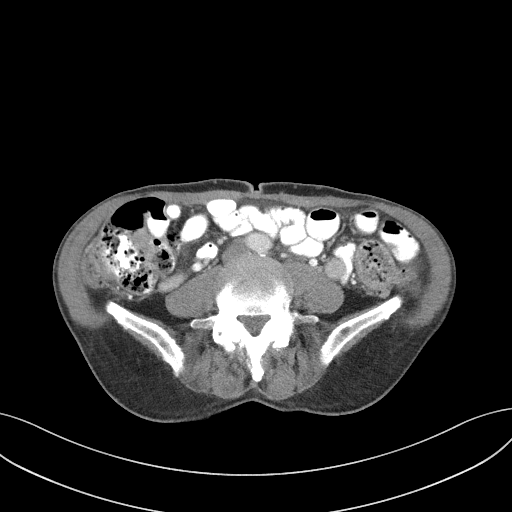
[im 59/98  soft-tissue]
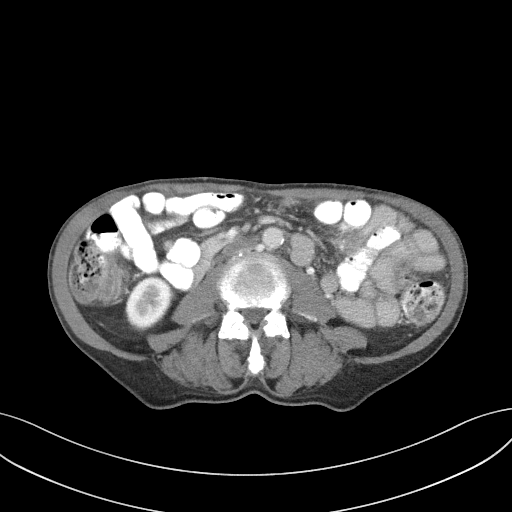
[im 65/98  soft-tissue]
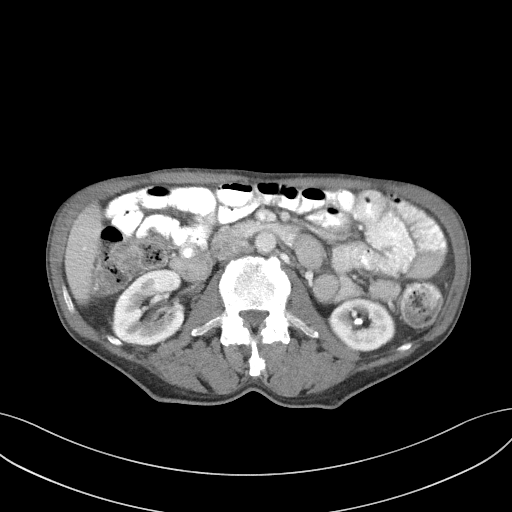
[im 65/98  bone]
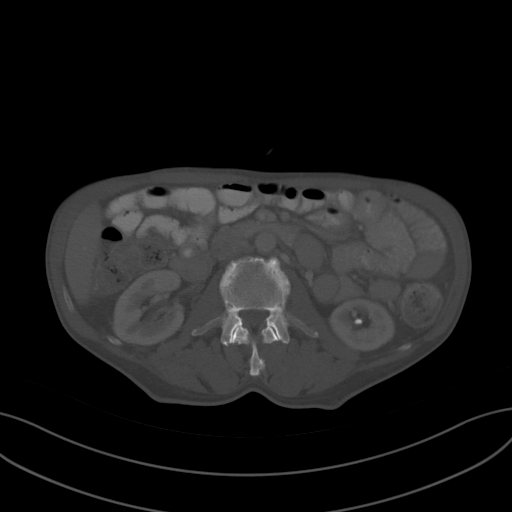
[im 72/98  soft-tissue]
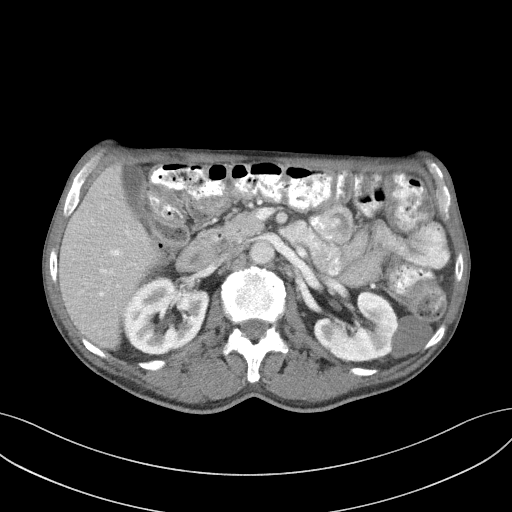
[im 78/98  soft-tissue]
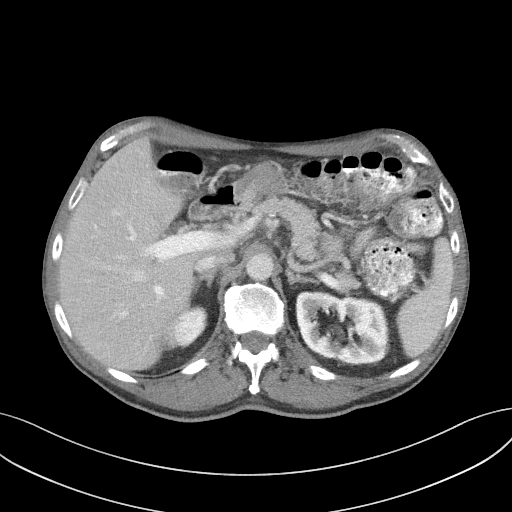
[im 85/98  soft-tissue]
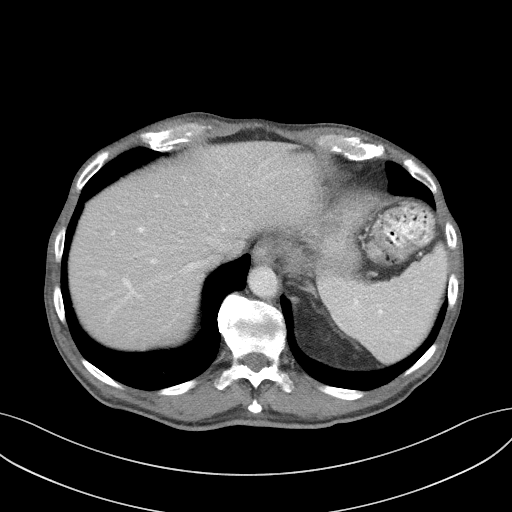
[im 91/98  soft-tissue]
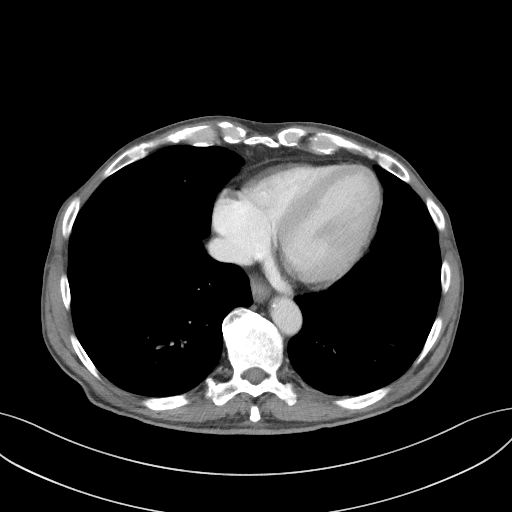

[Series 6: coronal st · coronal · 0.73mm/px · 3 of 102 slices shown]
[im 34/102  soft-tissue]
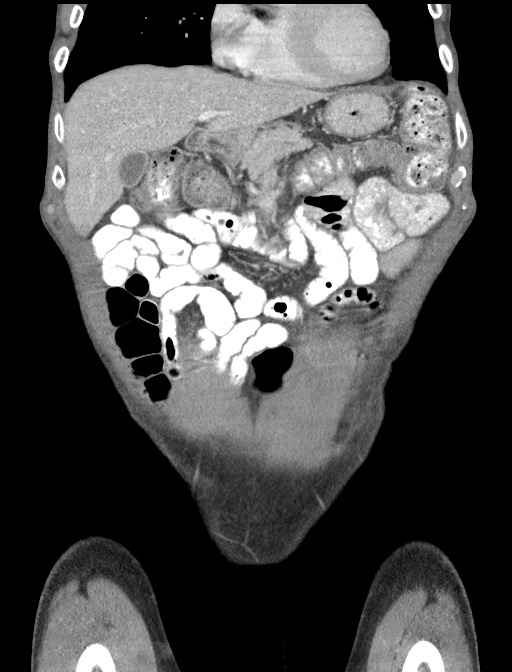
[im 45/102  soft-tissue]
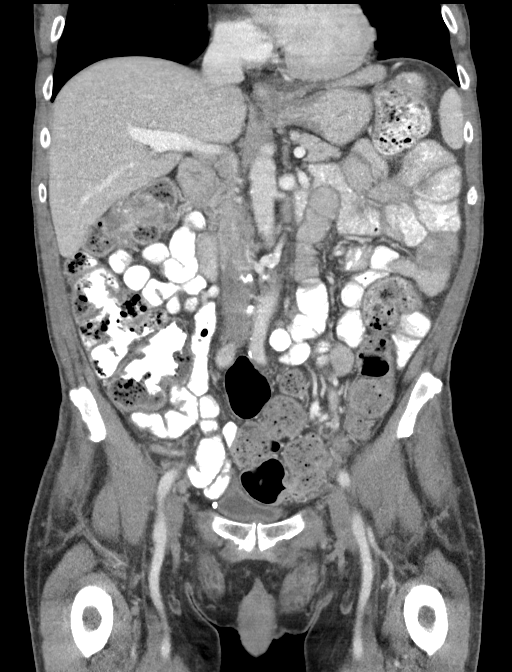
[im 57/102  soft-tissue]
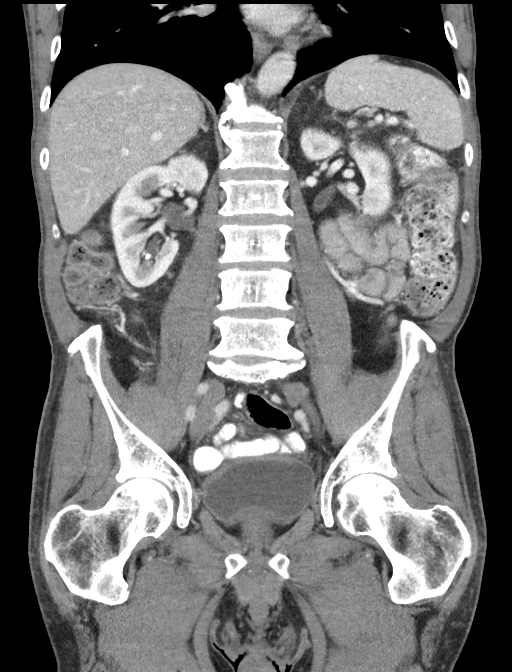

[16 of 46 positions shown; findings below may reference images not displayed]

FINDINGS: Lower Chest: No acute findings.

Hepatobiliary: No hepatic masses identified. Small gallstones are
again noted, however there is no evidence of cholecystitis or
biliary ductal dilatation.

Pancreas:  No mass or inflammatory changes.

Spleen: Within normal limits in size and appearance.

Adrenals/Urinary Tract: No masses identified. A few renal cysts are
again noted. Calculi are also seen in the lower poles of both
kidneys measuring 5-6 mm. No evidence of ureteral calculi or
hydronephrosis. Unremarkable unopacified urinary bladder.

Stomach/Bowel: A short segment small bowel intussusception is seen
involving jejunum in the left upper quadrant. No evidence of
obstruction, inflammatory process or abnormal fluid collections.
Normal appendix visualized.

Vascular/Lymphatic: No pathologically enlarged lymph nodes. No
abdominal aortic aneurysm. Aortic atherosclerosis noted.

Reproductive:  Mildly enlarged prostate.

Other:  None.

Musculoskeletal:  No suspicious bone lesions identified.
IMPRESSION: Short segment small bowel intussusception in left upper quadrant. No
evidence of obstruction or other complication. These are typically
idiopathic and self-limited.

Bilateral nephrolithiasis. No evidence of ureteral calculi or
hydronephrosis.

Cholelithiasis. No radiographic evidence of cholecystitis.

Mildly enlarged prostate.

Aortic Atherosclerosis ([8N]-[8N]).

## 2020-04-08 MED ORDER — IOHEXOL 300 MG/ML  SOLN
100.0000 mL | Freq: Once | INTRAMUSCULAR | Status: AC | PRN
  Administered 2020-04-08: 100 mL via INTRAVENOUS

## 2020-04-08 MED ORDER — SODIUM CHLORIDE (PF) 0.9 % IJ SOLN
INTRAMUSCULAR | Status: AC
  Filled 2020-04-08: qty 50

## 2020-04-12 ENCOUNTER — Encounter: Payer: Self-pay | Admitting: Neurology

## 2020-04-27 LAB — HM COLONOSCOPY

## 2020-05-23 DIAGNOSIS — R197 Diarrhea, unspecified: Secondary | ICD-10-CM | POA: Diagnosis not present

## 2020-05-23 DIAGNOSIS — R21 Rash and other nonspecific skin eruption: Secondary | ICD-10-CM | POA: Diagnosis not present

## 2020-05-23 DIAGNOSIS — D128 Benign neoplasm of rectum: Secondary | ICD-10-CM | POA: Diagnosis not present

## 2020-05-23 DIAGNOSIS — K2930 Chronic superficial gastritis without bleeding: Secondary | ICD-10-CM | POA: Diagnosis not present

## 2020-05-23 DIAGNOSIS — R209 Unspecified disturbances of skin sensation: Secondary | ICD-10-CM | POA: Diagnosis not present

## 2020-05-26 DIAGNOSIS — K5289 Other specified noninfective gastroenteritis and colitis: Secondary | ICD-10-CM | POA: Diagnosis not present

## 2020-06-08 DIAGNOSIS — Z23 Encounter for immunization: Secondary | ICD-10-CM | POA: Diagnosis not present

## 2020-06-22 DIAGNOSIS — E119 Type 2 diabetes mellitus without complications: Secondary | ICD-10-CM | POA: Diagnosis not present

## 2020-06-22 DIAGNOSIS — K4020 Bilateral inguinal hernia, without obstruction or gangrene, not specified as recurrent: Secondary | ICD-10-CM | POA: Diagnosis not present

## 2020-06-22 DIAGNOSIS — K4090 Unilateral inguinal hernia, without obstruction or gangrene, not specified as recurrent: Secondary | ICD-10-CM | POA: Diagnosis not present

## 2020-06-27 DIAGNOSIS — K4020 Bilateral inguinal hernia, without obstruction or gangrene, not specified as recurrent: Secondary | ICD-10-CM | POA: Insufficient documentation

## 2020-06-30 ENCOUNTER — Other Ambulatory Visit: Payer: Self-pay | Admitting: Neurology

## 2020-06-30 ENCOUNTER — Other Ambulatory Visit (INDEPENDENT_AMBULATORY_CARE_PROVIDER_SITE_OTHER): Payer: Medicare Other

## 2020-06-30 ENCOUNTER — Telehealth: Payer: Self-pay | Admitting: Neurology

## 2020-06-30 ENCOUNTER — Encounter: Payer: Self-pay | Admitting: Neurology

## 2020-06-30 ENCOUNTER — Other Ambulatory Visit: Payer: Self-pay

## 2020-06-30 ENCOUNTER — Other Ambulatory Visit: Payer: Medicare Other

## 2020-06-30 ENCOUNTER — Ambulatory Visit: Payer: Medicare Other | Admitting: Neurology

## 2020-06-30 VITALS — BP 163/97 | HR 94 | Ht 67.0 in | Wt 133.0 lb

## 2020-06-30 DIAGNOSIS — G959 Disease of spinal cord, unspecified: Secondary | ICD-10-CM | POA: Diagnosis not present

## 2020-06-30 DIAGNOSIS — Z77018 Contact with and (suspected) exposure to other hazardous metals: Secondary | ICD-10-CM

## 2020-06-30 LAB — FOLATE: Folate: 8.9 ng/mL (ref >5.9)

## 2020-06-30 MED ORDER — GABAPENTIN 300 MG PO CAPS: 300.0000 mg | ORAL_CAPSULE | Freq: Every day | ORAL | 3 refills | Status: DC

## 2020-06-30 NOTE — Progress Notes (Signed)
[facility] Clinic Note - Initial Visit   Date: 06/30/20  Kyle Ramsey MRN: [id number] DOB: 07/17/1956   Dear Dr. Inda Merlin:  Thank you for your kind referral of Kyle Ramsey for consultation of bilateral leg weakness Kyle Ramsey. Although his history is well known to you, please allow [place] to reiterate it for the purpose of our medical record. The patient was accompanied to the clinic by son who also provides collateral information.     History of Present Illness: Kyle Ramsey is a 64 y.o. male with diabetes Ramsey, Kyle Ramsey, Kyle Ramsey.  Patient established care with a PCP in May 2021 after turning 64 Kyle getting medicare.  Prior to this, he did not have any regular medical evaluation.  He was diagnosed with diabetes Ramsey with HbA1c 11, which has steadily improved with medication.  He tells me that after he was started on antiglycemic medication, his left leg became weak where he was unable to raise it, such as when squatting or climbing stairs.  This slowly started to also involve the right leg.  Around the same time, he began having burning, sharp Ramsey in thighs, lower legs, Kyle over the past month, it has extended into his abdomen.  He takes gabapentin 100-300mg  every 4 hours (day Kyle night), which he self-adjusted.  His primary complaint is bilateral leg Ramsey.  He also has 40lb weight loss.  EGD Kyle colonoscopy has been normal. He drinks 4-5 beers nightly x 50 years.  Out-side paper records, electronic medical record, Kyle images have been reviewed where available Kyle summarized as:  Labs 04/11/2020:  HbA1c 7.4, vitamin B12 359, TSh 2.15, CEA 9*  Past Medical History:  Diagnosis Date  . Kidney calculi     No past surgical history on file.   Medications:  Outpatient Encounter Medications as of 06/30/2020  Medication Sig  . fluticasone (FLONASE) 50 MCG/ACT nasal spray Place 1  spray into both nostrils daily.  Marland Kitchen gabapentin (NEURONTIN) 100 MG capsule Take 100 mg by mouth. Take 1 to 3 capsules by mouth at bedtime for nerve Ramsey  . gabapentin (NEURONTIN) 300 MG capsule Take 300 mg by mouth. Take 1 capsule by mouth 3-4 times a daily  . ibuprofen (ADVIL) 600 MG tablet Take 1 tablet (600 mg total) by mouth every 6 (six) hours as needed.  Marland Kitchen amoxicillin-clavulanate (AUGMENTIN) 875-125 MG tablet Take 1 tablet by mouth 2 (two) times daily. X 7 days (Patient not taking: Reported on 06/30/2020)  . gabapentin (NEURONTIN) 300 MG capsule Take 1 capsule (300 mg total) by mouth 5 (five) times daily.  Marland Kitchen glimepiride (AMARYL) 2 MG tablet Take 2 mg by mouth every morning. (Patient not taking: Reported on 06/30/2020)   No facility-administered encounter medications on file as of 06/30/2020.    Allergies:  Allergies  Allergen Reactions  . Codeine Nausea Kyle Vomiting  . Erythromycin Other (See Comments)    Gall bladder problem    Family History: No family history on file.  Social History: Social History   Tobacco Ramsey  . Smoking status: Current Every Day Smoker    Packs/day: 1.00  . Smokeless tobacco: Never Used  Vaping Ramsey  . Vaping Ramsey: Never used  Substance Ramsey Topics  . Kyle Ramsey: Yes  . Drug Ramsey: Not on file   Social History   Social History Narrative  . Not on file    Vital Signs:  BP (!) 163/97  Pulse 94   Ht 5\' 7"  (1.702 m)   Wt 133 lb (60.3 kg)   SpO2 95%   BMI 20.83 kg/m   Neurological Exam: MENTAL STATUS including orientation to time, place, person, recent Kyle remote memory, attention span Kyle concentration, language, Kyle fund of knowledge is normal.  Speech is not dysarthric.  CRANIAL NERVES: II:  No visual field defects.  Unremarkable fundi.   III-IV-VI: Pupils equal round Kyle reactive to light.  Normal conjugate, extra-ocular eye movements in all directions of gaze, except restricted upgaze bilaterally.  No nystagmus.  No ptosis.   V:  Normal  facial sensation.    VII:  Normal facial symmetry Kyle movements.   VIII:  Normal hearing Kyle vestibular function.   IX-X:  Normal palatal movement.   XI:  Normal shoulder shrug Kyle head rotation.   XII:  Normal tongue strength Kyle range of motion, no deviation or fasciculation.  MOTOR:  Marked generalized loss of muscle bulk throughout. No fasciculations or abnormal movements.  No pronator drift.   Upper Extremity:  Right  Left  Deltoid  5/5   5/5   Biceps  5/5   5/5   Triceps  5/5   5/5   Infraspinatus 5/5  5/5  Medial pectoralis 5/5  5/5  Wrist extensors  5/5   5/5   Wrist flexors  5/5   5/5   Finger extensors  5/5   5/5   Finger flexors  5/5   5/5   Dorsal interossei  5/5   5/5   Abductor pollicis  5/5   5/5   Tone (Ashworth scale)  0  0   Lower Extremity:  Right  Left  Hip flexors  5/5   5/5   Hip extensors  5/5   5/5   Adductor 5/5  5/5  Abductor 5/5  5/5  Knee flexors  5/5   5/5   Knee extensors  5/5   5/5   Dorsiflexors  5/5   5/5   Plantarflexors  5/5   5/5   Toe extensors  5/5   5/5   Toe flexors  5/5   5/5   Tone (Ashworth scale)  0+  0+   MSRs:  Right        Left                  brachioradialis 2+  2+  biceps 2+  2+  triceps 2+  2+  patellar 3+  3+  ankle jerk 0  0  Hoffman no  no  plantar response down  down  Crossed adductors bilaterally  SENSORY:  Vibration reduced at the knee ~30%, absent at the ankles.  Temperature Kyle pin prick reduced in the lower legs bilaterally from the knee down. There is no sensory level.  Romberg's sign present.   COORDINATION/GAIT: Normal finger-to- nose-finger.  Intact rapid alternating movements bilaterally.  Gait mildly wide-based, slow, short steps, stooped.    IMPRESSION: 1. Myelopathy causing bilateral leg weakness Kyle Ramsey.  Need to evaluation for structural cord pathology involving the thoracic Kyle lumbar spine.  With his unintentional weigh loss, malignancy needs to be excluded.   2. Peripheral neuropathy  affecting the feet secondary to diabetes Ramsey Kyle Kyle  PLAN/RECOMMENDATIONS:  Check MRI thoracic Kyle lumbar spine wo contrast Check vitamin B1, copper, folate, MMA Lengthy discussion regarding Ramsey management options.  I do not think it is reasonable to take gabapentin 300mg  every 4 hours, including at nighttime, which  he has self-adjusted.  Therefore, I recommend he start with gabapentin 300mg  at 7am, 11am, 3pm, Kyle 400mg  at bedtime.  He may titrate bedtime dose slowly to 600mg , as long as it is tolerable.  Schedule provided.  Consider nortriptyline going forward  Further recommendations pending results.  Total time spent reviewing records, interview, history/exam, counseling documentation, Kyle coordination of care on day of encounter:  60 min      Thank you for allowing me to participate in patient's care.  If I can answer any additional questions, I would be pleased to do so.    Sincerely,    Lovelace Cerveny K. Posey Pronto, DO

## 2020-06-30 NOTE — Patient Instructions (Signed)
MRI thoracic spine and lumbar spine  Check labs  Start gabapentin as follows:  Day 1-2:  300mg  at 7am, 11am, 3pm, and 400mg  at bedtime Day 3-4:  300mg  at 7am, 11am, 3pm, and 500mg  at bedtime Thereafter:  300mg  at 7am, 11am, 3pm, and 600mg  bedtime

## 2020-06-30 NOTE — Telephone Encounter (Signed)
Please call pharmacy and let them know I am changing his dose of gabapentin, therefore new Rx was sent.

## 2020-06-30 NOTE — Telephone Encounter (Signed)
Patient  called in stating the pharmacy called him and let him know he couldn't get anymore gabapentin until 07/08/20. He was not sure why and what he should do?

## 2020-07-01 NOTE — Telephone Encounter (Signed)
Called [facility] in [place] and informed Pharmacist that new Gabapentin rx was sent in with new dose. Pharmacist aware and took out old orders of Gabapentin so there is no confusion.

## 2020-07-01 NOTE — Telephone Encounter (Signed)
Called patient and informed him that new rx was sent and pharmacist is aware. Before hanging up patient asked how long it takes for lab results to get back to [place]? I informed patient that it depends on how busy the lab is and that it could take a day, or two, or longer it just all depends. Informed patient that he would get a call back once we receive his results.

## 2020-07-03 LAB — COPPER, SERUM: Copper: 85 ug/dL (ref 70–175)

## 2020-07-03 LAB — VITAMIN B1: Vitamin B1 (Thiamine): 8 nmol/L (ref 8–30)

## 2020-07-03 LAB — METHYLMALONIC ACID, SERUM: Methylmalonic Acid, Quant: 306 nmol/L (ref 87–318)

## 2020-07-04 ENCOUNTER — Telehealth: Payer: Self-pay

## 2020-07-04 NOTE — Telephone Encounter (Signed)
Called patient and informed him of normal labs.

## 2020-07-04 NOTE — Telephone Encounter (Signed)
-----   Message from Alda Berthold, DO sent at 07/04/2020 12:22 PM EST ----- Please notify patient lab are within normal limits.  Thank you.

## 2020-07-12 ENCOUNTER — Telehealth: Payer: Self-pay | Admitting: Neurology

## 2020-07-12 MED ORDER — GABAPENTIN 100 MG PO CAPS
ORAL_CAPSULE | ORAL | 1 refills | Status: DC
Start: 2020-07-12 — End: 2020-07-15

## 2020-07-12 NOTE — Telephone Encounter (Signed)
Prescription for gabapentin 100mg  sent as requested.

## 2020-07-13 ENCOUNTER — Other Ambulatory Visit: Payer: Self-pay | Admitting: Neurology

## 2020-07-14 ENCOUNTER — Other Ambulatory Visit: Payer: Self-pay

## 2020-07-14 ENCOUNTER — Telehealth: Payer: Self-pay | Admitting: Neurology

## 2020-07-14 MED ORDER — GABAPENTIN 300 MG PO CAPS
300.0000 mg | ORAL_CAPSULE | Freq: Every day | ORAL | 1 refills | Status: DC
Start: 2020-07-14 — End: 2020-08-29

## 2020-07-14 NOTE — Telephone Encounter (Signed)
[facility] [place]  Script at  [facility] was DC

## 2020-07-14 NOTE — Telephone Encounter (Signed)
Please explain we are looking at his spinal cord - from mid-back to where it ends in the lower lumbar region.

## 2020-07-14 NOTE — Telephone Encounter (Signed)
Pt called no answer left a voice mail letting him know that that [facility] [place]

## 2020-07-14 NOTE — Telephone Encounter (Signed)
1. Patient's pharmacy wants all his prescriptions going forward [facility] [place].  2. Gabapentin 100 MG: patient needs new [facility] [place]. The previous one cannot be retrieved [facility].

## 2020-07-14 NOTE — Telephone Encounter (Signed)
Patient left message with accessnurse stating he has MRI scheduled this week and wants to make sure the order can include his very low back to buttocks.

## 2020-07-14 NOTE — Telephone Encounter (Signed)
Patient called in and wanted to make sure we get the [facility] [place].

## 2020-07-15 ENCOUNTER — Other Ambulatory Visit: Payer: Self-pay | Admitting: Neurology

## 2020-07-15 ENCOUNTER — Other Ambulatory Visit: Payer: Self-pay

## 2020-07-15 ENCOUNTER — Ambulatory Visit
Admission: RE | Admit: 2020-07-15 | Discharge: 2020-07-15 | Disposition: A | Discharge status: Home / Self Care | Payer: Medicare Other | Source: Ambulatory Visit | Attending: Neurology | Admitting: Neurology

## 2020-07-15 DIAGNOSIS — Z135 Encounter for screening for eye and ear disorders: Secondary | ICD-10-CM | POA: Diagnosis not present

## 2020-07-15 DIAGNOSIS — Z01818 Encounter for other preprocedural examination: Secondary | ICD-10-CM | POA: Diagnosis not present

## 2020-07-15 DIAGNOSIS — Z77018 Contact with and (suspected) exposure to other hazardous metals: Secondary | ICD-10-CM

## 2020-07-15 IMAGING — CR DG ORBITS FOR FOREIGN BODY
2 series · 2 of 2 positions shown · non-contrast
Comparison: None.

CLINICAL DATA: Metal working/exposure; clearance prior to MRI

EXAM:
ORBITS FOR FOREIGN BODY - 2 VIEW

[w orbit pa (1 of 2)]
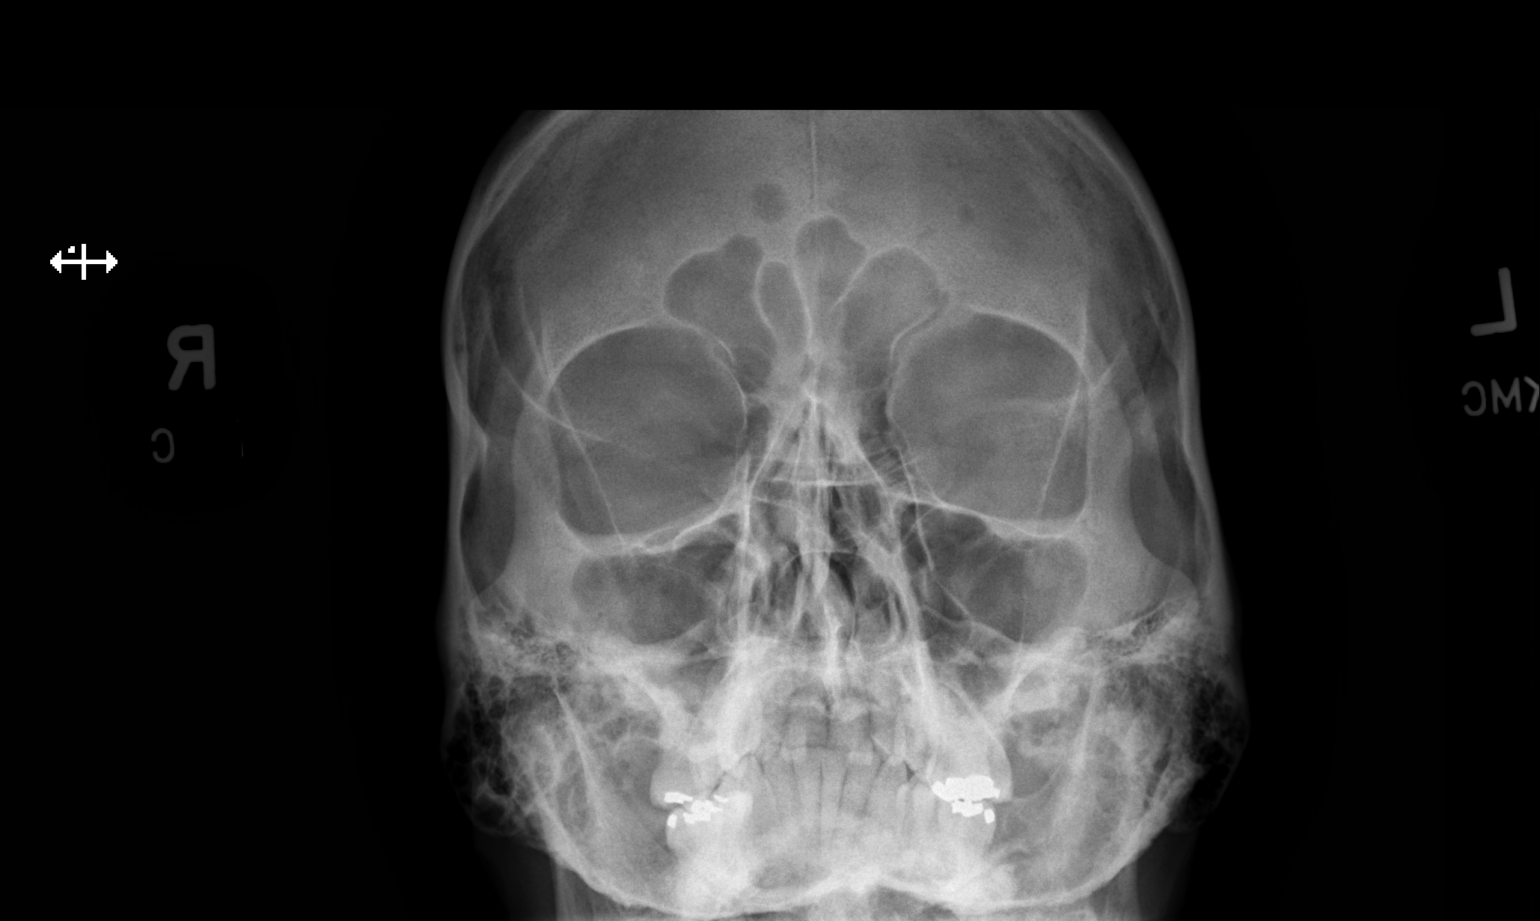

[w orbit pa (2 of 2)]
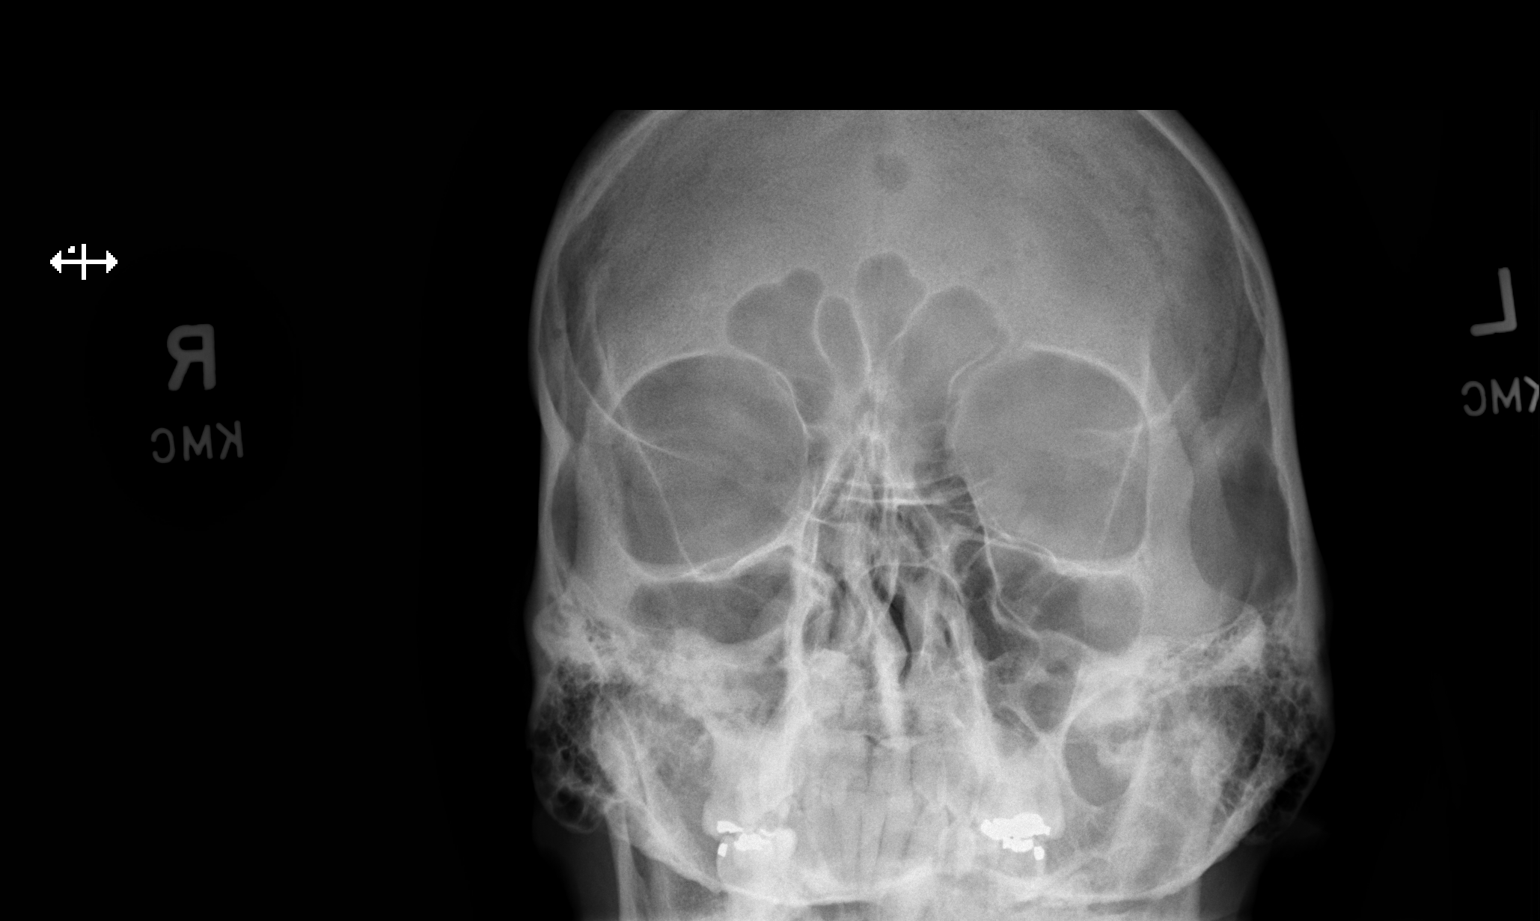

[2 of 2 positions shown; findings below may reference images not displayed]

FINDINGS: There is no evidence of metallic foreign body within the orbits. No
significant bone abnormality identified.
IMPRESSION: No evidence of metallic foreign body within the orbits.

## 2020-07-15 MED ORDER — GABAPENTIN 100 MG PO CAPS
ORAL_CAPSULE | ORAL | 1 refills | Status: DC
Start: 2020-07-15 — End: 2021-02-20

## 2020-07-15 NOTE — Telephone Encounter (Signed)
Pt called and informed that Dr Posey Pronto is looking at his spinal cord - from mid-back to where it ends in the lower lumbar region, pt verbalized understanding,

## 2020-07-15 NOTE — Telephone Encounter (Signed)
Pt states that he needs the Gabapentin 100mg  [facility] [place]. He states that we called in gabapentin 300mg    Please call patient about this

## 2020-07-15 NOTE — Telephone Encounter (Signed)
Script called in

## 2020-07-17 ENCOUNTER — Other Ambulatory Visit: Payer: Medicare Other

## 2020-07-17 ENCOUNTER — Ambulatory Visit
Admission: RE | Admit: 2020-07-17 | Discharge: 2020-07-17 | Disposition: A | Discharge status: Home / Self Care | Payer: Medicare Other | Source: Ambulatory Visit | Attending: Neurology | Admitting: Neurology

## 2020-07-17 DIAGNOSIS — G959 Disease of spinal cord, unspecified: Secondary | ICD-10-CM

## 2020-07-17 DIAGNOSIS — M5137 Other intervertebral disc degeneration, lumbosacral region: Secondary | ICD-10-CM | POA: Diagnosis not present

## 2020-07-17 DIAGNOSIS — M5134 Other intervertebral disc degeneration, thoracic region: Secondary | ICD-10-CM | POA: Diagnosis not present

## 2020-07-17 DIAGNOSIS — M5127 Other intervertebral disc displacement, lumbosacral region: Secondary | ICD-10-CM | POA: Diagnosis not present

## 2020-07-17 DIAGNOSIS — M47817 Spondylosis without myelopathy or radiculopathy, lumbosacral region: Secondary | ICD-10-CM | POA: Diagnosis not present

## 2020-07-17 IMAGING — MR MR LUMBAR SPINE W/O CM
4 of 5 series · 22 of 48 positions shown · non-contrast
Comparison: None available.

CLINICAL DATA: Initial evaluation for bilateral leg pain for 6
months, myelopathy.

EXAM:
MRI THORACIC AND LUMBAR SPINE WITHOUT CONTRAST
TECHNIQUE: Multiplanar and multiecho pulse sequences of the thoracic and lumbar
spine were obtained without intravenous contrast.

[Series 3: T2 post-contrast · sagittal · 4.0mm · 0.53mm/px · 6 of 16 slices shown]
[im 1/16]
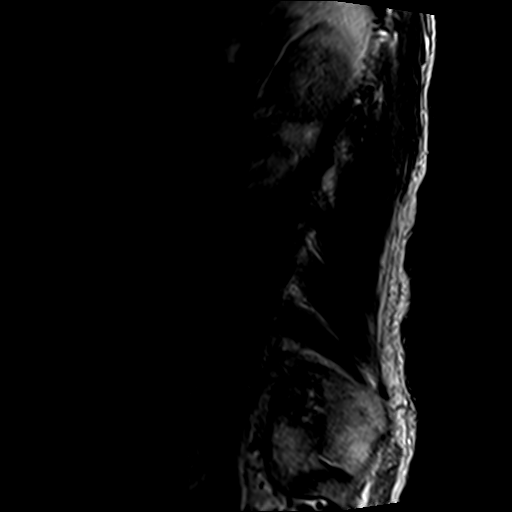
[im 4/16]
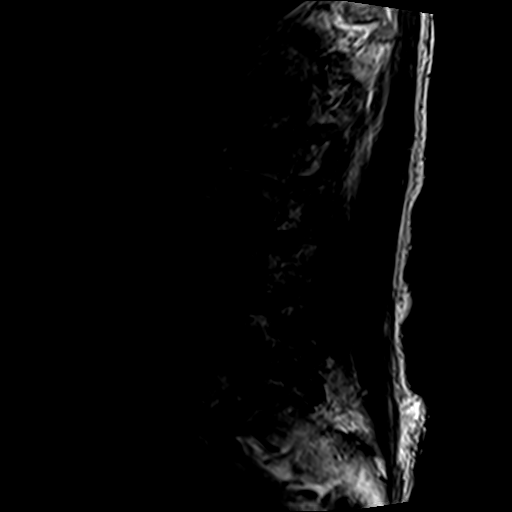
[im 7/16]
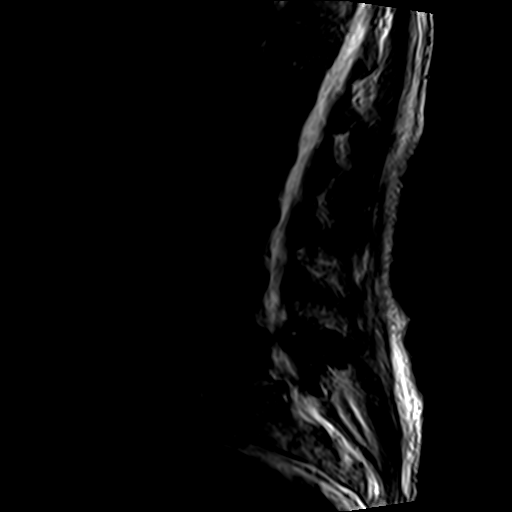
[im 10/16]
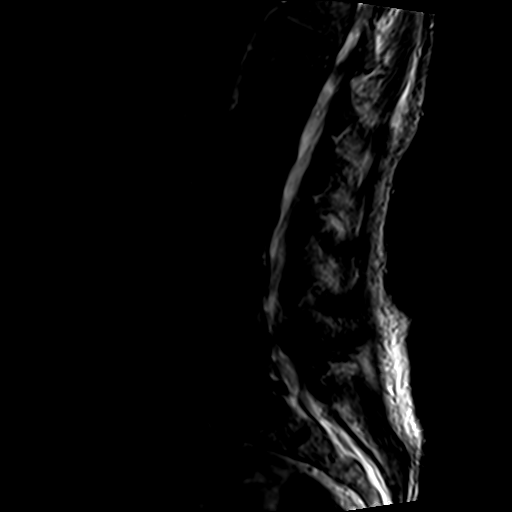
[im 13/16]
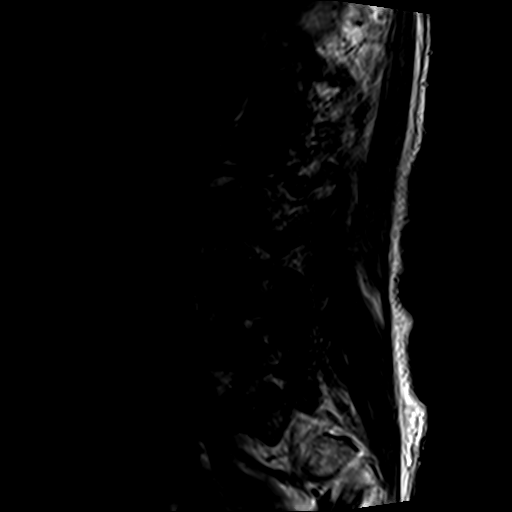
[im 16/16]
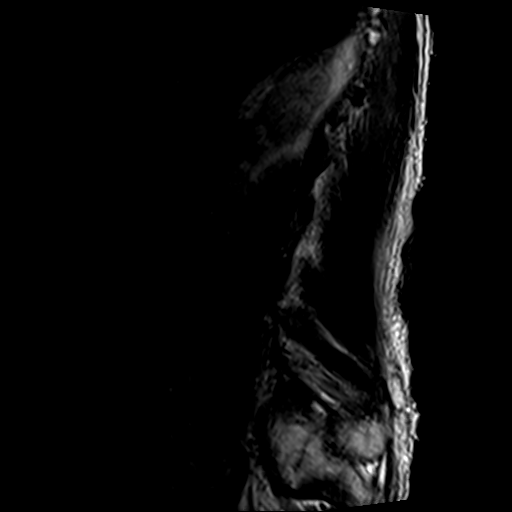

[Series 5: T1 · sagittal · 4.0mm · 0.53mm/px · 4 of 16 slices shown (1 of 2)]
[im 1/16]
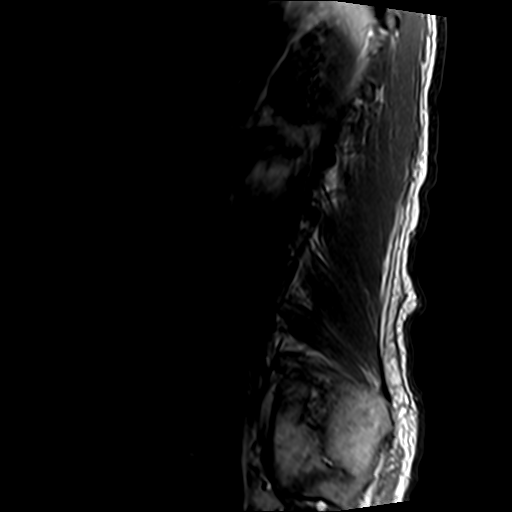
[im 4/16]
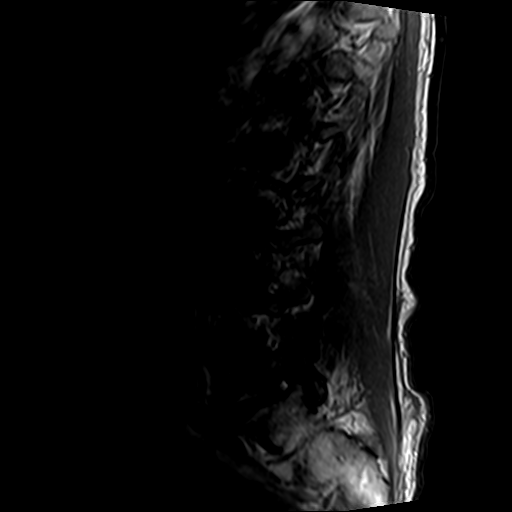
[im 10/16]
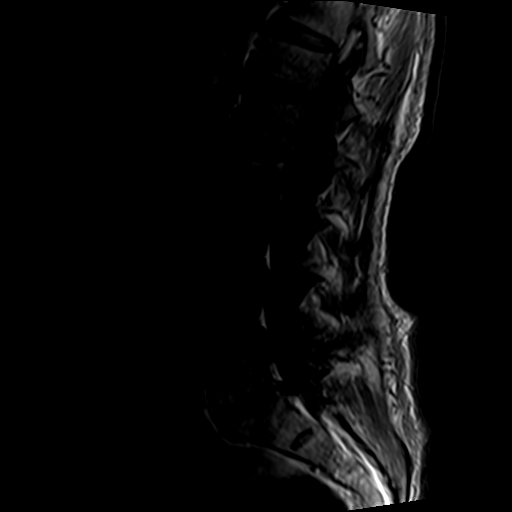
[im 16/16]
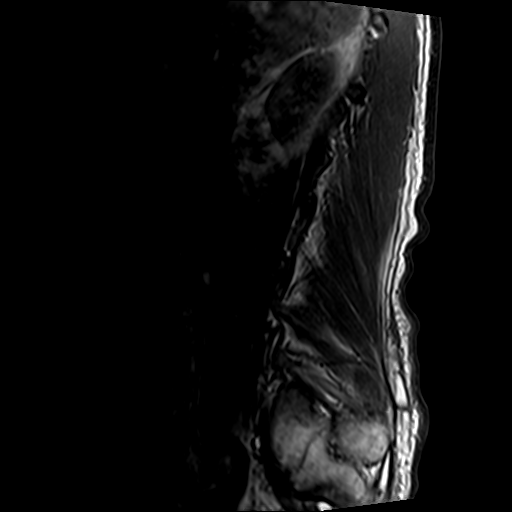

[Series 6: T2 · axial · 4.0mm · 0.70mm/px · z∈[-65,+144]mm · 9 of 37 slices shown]
[im 1/37]
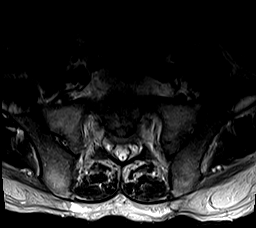
[im 6/37]
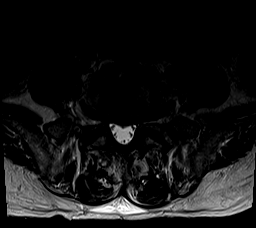
[im 11/37]
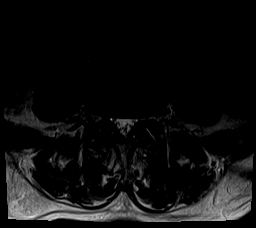
[im 16/37]
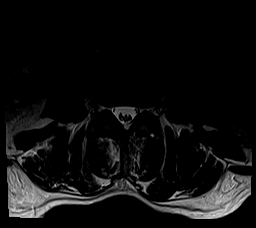
[im 19/37]
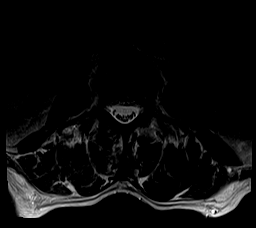
[im 21/37]
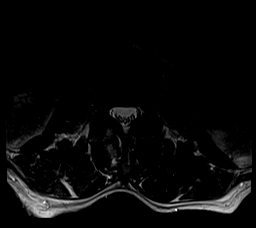
[im 26/37]
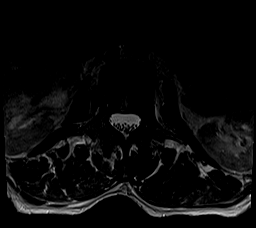
[im 31/37]
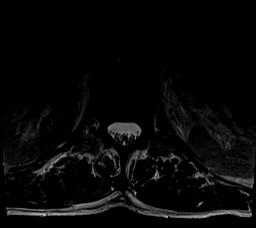
[im 37/37]
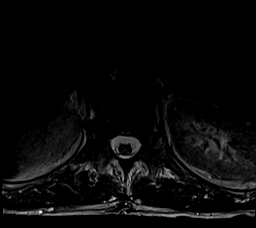

[Series 7: T1 · axial · 4.0mm · 0.35mm/px · z∈[-40,+113]mm · 3 of 37 slices shown (2 of 2)]
[im 6/37]
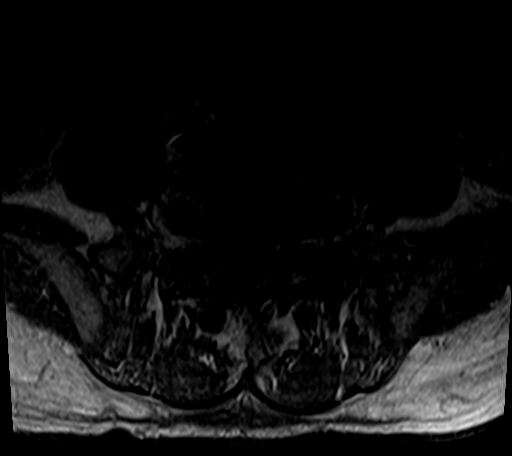
[im 19/37]
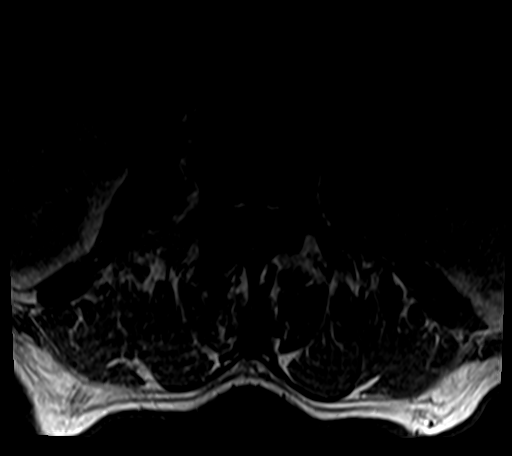
[im 31/37]
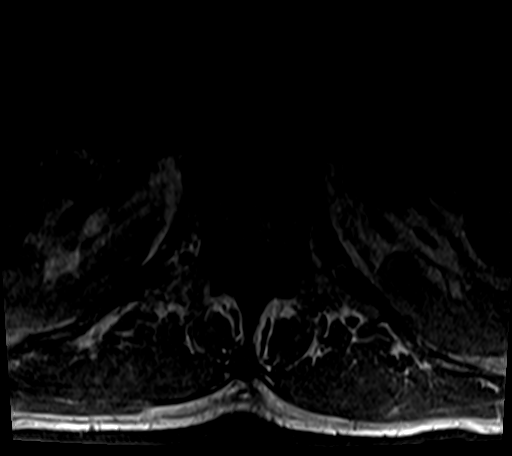

[22 of 48 positions shown; findings below may reference images not displayed]

FINDINGS: MRI THORACIC SPINE FINDINGS

Alignment: Exaggeration of the normal thoracic kyphosis. No
listhesis or static subluxation.

Vertebrae: Vertebral body height maintained without acute or chronic
fracture. Bone marrow signal intensity within normal limits. 13 mm
T2/stir hyperintense lesion within the T6 vertebral body favored to
reflect an atypical hemangioma. No other discrete or worrisome
osseous lesions. Minimal reactive endplate change noted about the
C5-6 and C6-7 interspaces. No other abnormal marrow edema. Partial
ankylosis of the T7 through T9 vertebral bodies noted anteriorly due
to underlying thoracic kyphosis.

Cord: Normal signal and morphology. No cord signal changes to
suggest myelopathy identified.

Paraspinal and other soft tissues: Paraspinous soft tissues within
normal limits. Few small simple cyst partially visualize within the
left kidney. Visualized visceral structures otherwise unremarkable.

Disc levels:

T1-2:  Unremarkable.

T2-3: Unremarkable.

T3-4:  Unremarkable.

T4-5:  Unremarkable.

T5-6: Disc desiccation with mild intervertebral disc space
narrowing. Associated minor reactive endplate change. No canal or
foraminal stenosis.

T6-7: Degenerative intervertebral disc space narrowing with disc
desiccation and mild reactive endplate change. No spinal stenosis.
Foramina remain patent.

T7-8: Degenerative intervertebral disc space narrowing with disc
desiccation with mild reactive endplate change. No spinal stenosis.
Foramina remain patent.

T8-9: Degenerative intervertebral disc space narrowing with disc
desiccation and mild reactive endplate change. No spinal stenosis.
Foramina remain patent.

T9-10: Degenerative intervertebral disc space narrowing with disc
desiccation and mild reactive endplate change. No spinal stenosis.
Foramina remain patent.

T10-11: Disc desiccation without significant disc bulge. No canal or
foraminal stenosis.

T11-12: Disc desiccation without significant disc bulge. No canal or
foraminal stenosis.

T12-L1: Disc desiccation without significant disc bulge. No canal or
foraminal stenosis.

MRI LUMBAR SPINE FINDINGS

Segmentation:  Examination degraded by motion artifact.

Standard segmentation. Lowest well-formed disc space labeled the
L5-S1 level.

Alignment: Trace 2 mm anterolisthesis of L4 on L5, chronic and facet
mediated. Alignment otherwise normal with preservation of the normal
lumbar lordosis.

Vertebrae: Vertebral body height maintained without acute or chronic
fracture. Bone marrow signal intensity within normal limits. 9 mm
T2/stir hyperintense lesion within the L1 vertebral body likely
reflects an atypical hemangioma. No other discrete or worrisome
osseous lesions. Mild reactive endplate changes about the L5-S1
interspace. Mild reactive marrow edema about the L4-5 and L5-S1
facets due to facet arthritis, which could contribute to underlying
back pain.

Conus medullaris and cauda equina: Conus extends to the L1 level.
Conus and cauda equina appear normal.

Paraspinal and other soft tissues: Paraspinous soft tissues grossly
within normal limits. Few scattered small simple cyst noted within
the kidneys bilaterally. Visualized visceral structures otherwise
unremarkable.

Disc levels:

L1-2: Minimal annular disc bulge with facet hypertrophy. No canal or
foraminal stenosis.

L2-3: Disc desiccation with mild annular disc bulge. Mild bilateral
facet hypertrophy. No spinal stenosis. Foramina remain patent.

L3-4: Disc desiccation with mild annular disc bulge. Mild bilateral
facet hypertrophy. No spinal stenosis. Mild bilateral L3 foraminal
narrowing without frank impingement.

L4-5: Trace anterolisthesis. Mild disc bulge with disc desiccation.
Moderate to advanced bilateral facet degeneration with associated
small joint effusions. Resultant mild canal with bilateral
subarticular stenosis, with mild to moderate bilateral L4 foraminal
narrowing.

L5-S1: Degenerative intervertebral disc space narrowing with diffuse
disc bulge and disc desiccation. Associated discogenic reactive
endplate change with marginal endplate osteophytic spurring. Broad
posterior disc osteophyte mildly flattens and indents the ventral
thecal sac. Slight caudad angulation of disc material noted. Mild
bilateral facet hypertrophy. No significant canal or lateral recess
stenosis. Moderate left worse than right L5 foraminal stenosis.
IMPRESSION: MR THORACIC SPINE IMPRESSION:

1. Normal MRI appearance of the thoracic spinal cord. No cord signal
changes to suggest myelopathy.
2. Exaggeration of the normal thoracic kyphosis with associated mild
multilevel degenerative disc desiccation and reactive endplate
changes, most pronounced at T5-6 through T9-10. No significant canal
or foraminal stenosis or evidence for neural impingement.

MR LUMBAR SPINE IMPRESSION:

1. Normal MRI appearance of the conus medullaris and nerve roots of
the cauda equina.
2. Degenerative disc disease with reactive endplate change at L5-S1
with resultant moderate left worse than right L5 foraminal stenosis.
3. Disc bulge with facet hypertrophy at L4-5 with resultant mild
canal and bilateral subarticular stenosis, with mild to moderate
bilateral L4 foraminal narrowing.
4. Mild reactive marrow edema about the L4-5 and L5-S1 facets due to
facet arthritis, which could contribute to underlying back pain.

## 2020-07-17 IMAGING — MR MR THORACIC SPINE W/O CM
4 of 5 series · 14 of 48 positions shown · non-contrast
Comparison: None available.

CLINICAL DATA: Initial evaluation for bilateral leg pain for 6
months, myelopathy.

EXAM:
MRI THORACIC AND LUMBAR SPINE WITHOUT CONTRAST
TECHNIQUE: Multiplanar and multiecho pulse sequences of the thoracic and lumbar
spine were obtained without intravenous contrast.

[Series 4: STIR · sagittal · 3.0mm · 1.09mm/px · 3 of 19 slices shown]
[im 3/19]
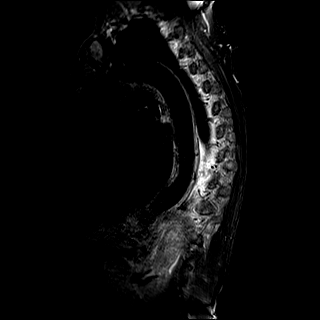
[im 11/19]
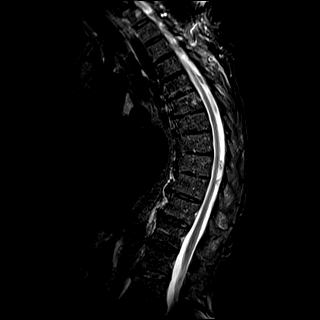
[im 16/19]
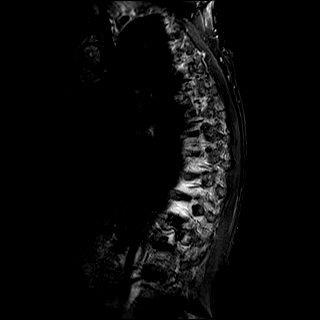

[Series 5: T2 post-contrast · sagittal · 3.0mm · 0.55mm/px · 5 of 19 slices shown]
[im 1/19]
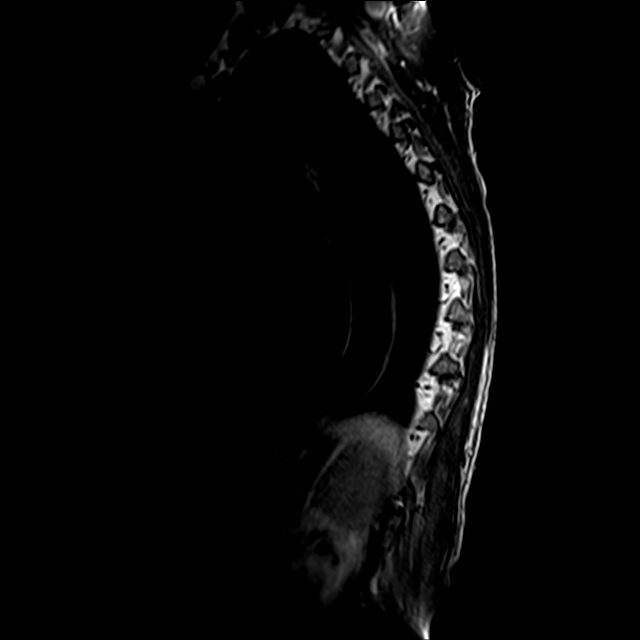
[im 4/19]
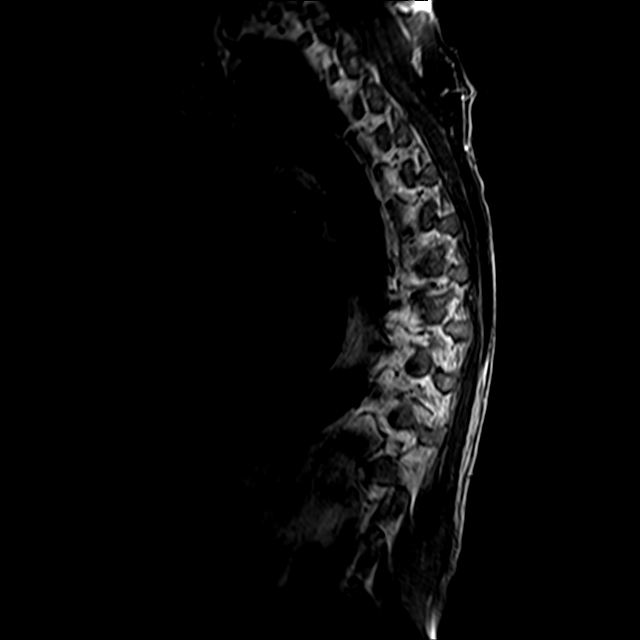
[im 7/19]
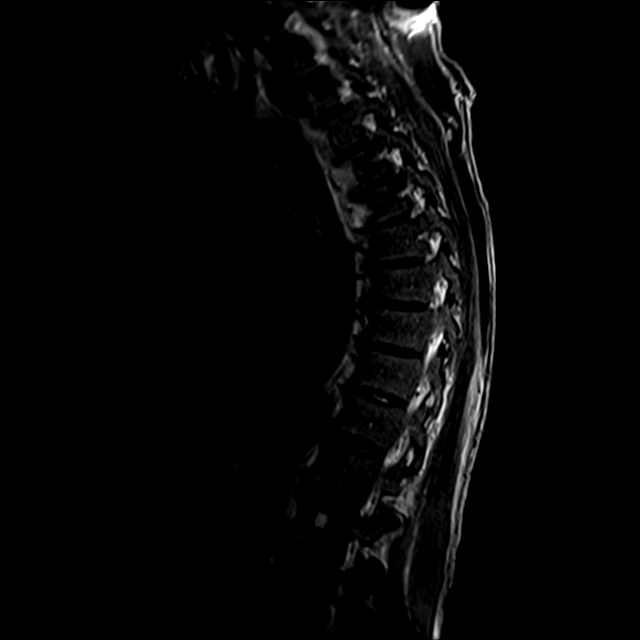
[im 10/19]
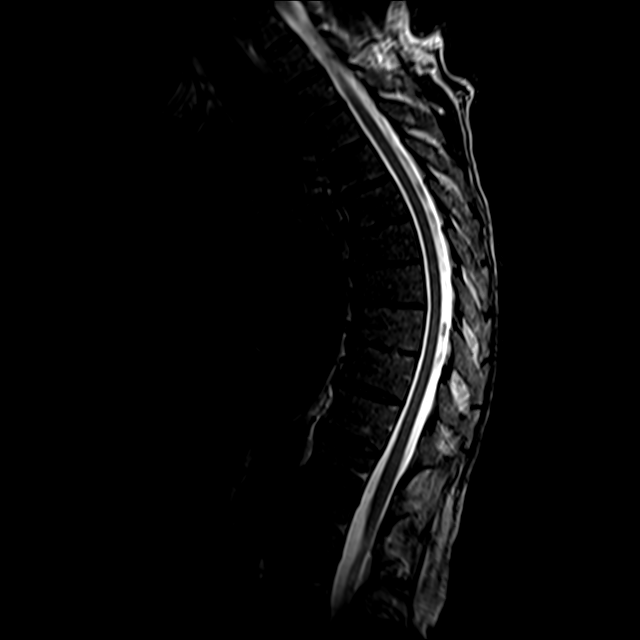
[im 16/19]
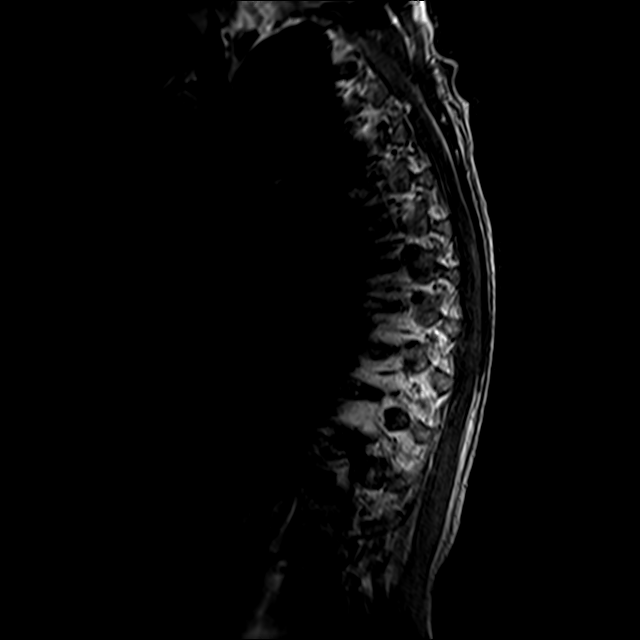

[Series 6: T1 · sagittal · 3.0mm · 0.55mm/px · 3 of 19 slices shown]
[im 4/19]
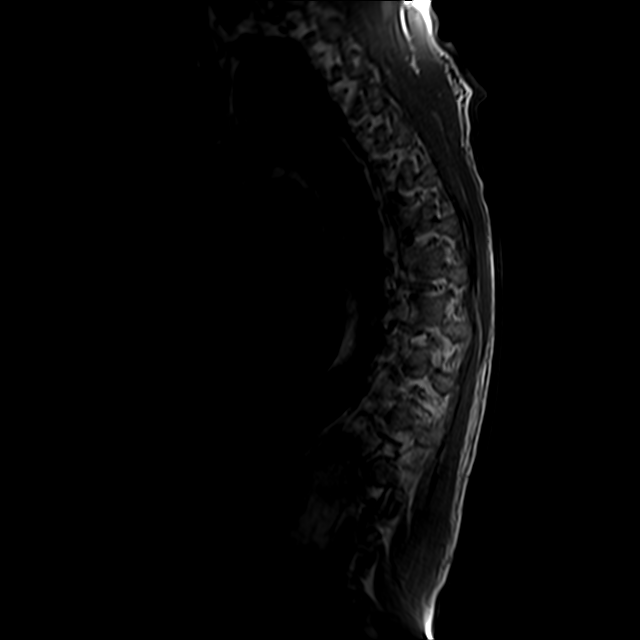
[im 10/19]
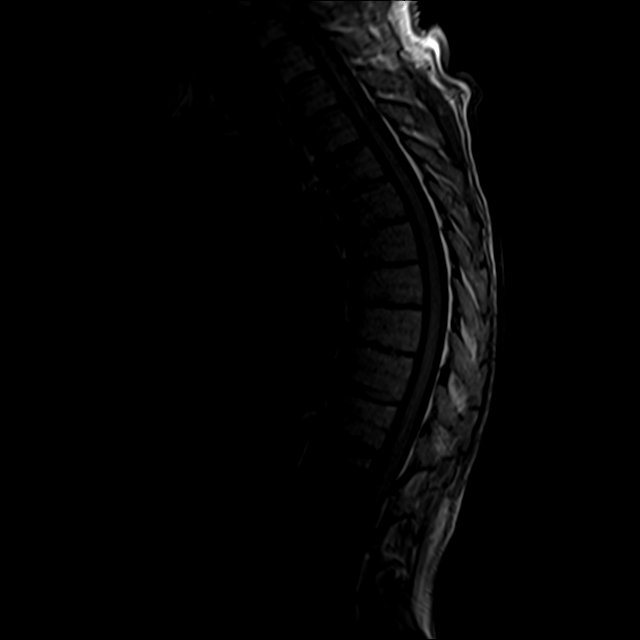
[im 16/19]
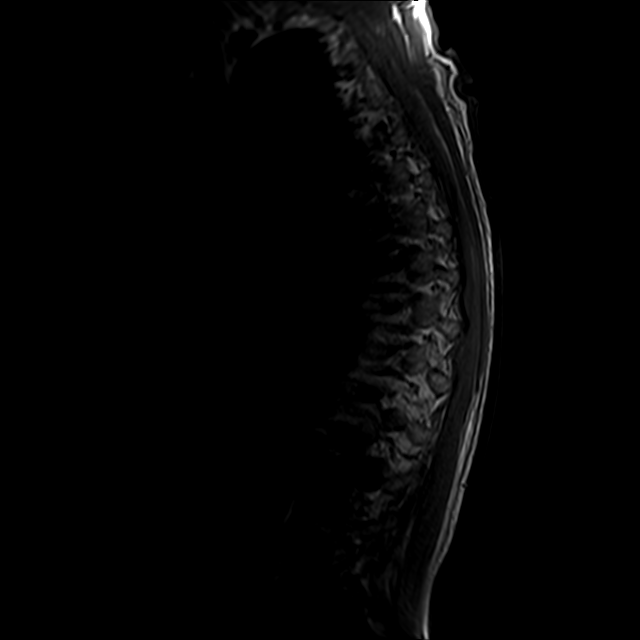

[Series 7: T2 · axial · 4.0mm · 0.39mm/px · z∈[-202,-87]mm · 3 of 36 slices shown]
[im 6/36]
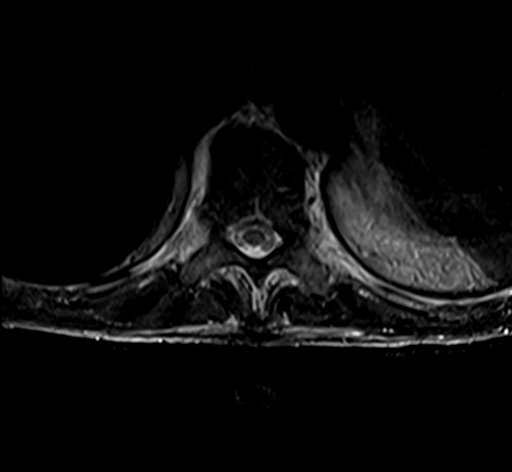
[im 18/36]
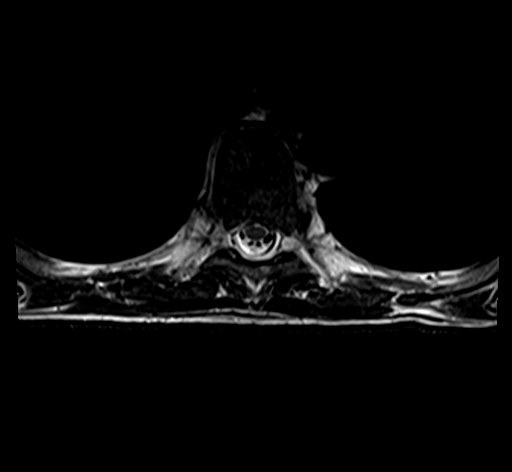
[im 30/36]
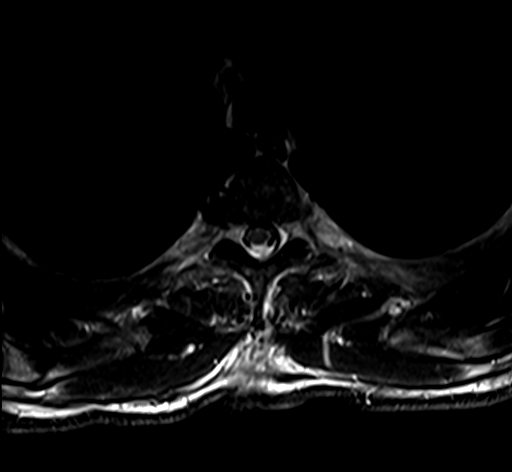

[14 of 48 positions shown; findings below may reference images not displayed]

FINDINGS: MRI THORACIC SPINE FINDINGS

Alignment: Exaggeration of the normal thoracic kyphosis. No
listhesis or static subluxation.

Vertebrae: Vertebral body height maintained without acute or chronic
fracture. Bone marrow signal intensity within normal limits. 13 mm
T2/stir hyperintense lesion within the T6 vertebral body favored to
reflect an atypical hemangioma. No other discrete or worrisome
osseous lesions. Minimal reactive endplate change noted about the
C5-6 and C6-7 interspaces. No other abnormal marrow edema. Partial
ankylosis of the T7 through T9 vertebral bodies noted anteriorly due
to underlying thoracic kyphosis.

Cord: Normal signal and morphology. No cord signal changes to
suggest myelopathy identified.

Paraspinal and other soft tissues: Paraspinous soft tissues within
normal limits. Few small simple cyst partially visualize within the
left kidney. Visualized visceral structures otherwise unremarkable.

Disc levels:

T1-2:  Unremarkable.

T2-3: Unremarkable.

T3-4:  Unremarkable.

T4-5:  Unremarkable.

T5-6: Disc desiccation with mild intervertebral disc space
narrowing. Associated minor reactive endplate change. No canal or
foraminal stenosis.

T6-7: Degenerative intervertebral disc space narrowing with disc
desiccation and mild reactive endplate change. No spinal stenosis.
Foramina remain patent.

T7-8: Degenerative intervertebral disc space narrowing with disc
desiccation with mild reactive endplate change. No spinal stenosis.
Foramina remain patent.

T8-9: Degenerative intervertebral disc space narrowing with disc
desiccation and mild reactive endplate change. No spinal stenosis.
Foramina remain patent.

T9-10: Degenerative intervertebral disc space narrowing with disc
desiccation and mild reactive endplate change. No spinal stenosis.
Foramina remain patent.

T10-11: Disc desiccation without significant disc bulge. No canal or
foraminal stenosis.

T11-12: Disc desiccation without significant disc bulge. No canal or
foraminal stenosis.

T12-L1: Disc desiccation without significant disc bulge. No canal or
foraminal stenosis.

MRI LUMBAR SPINE FINDINGS

Segmentation:  Examination degraded by motion artifact.

Standard segmentation. Lowest well-formed disc space labeled the
L5-S1 level.

Alignment: Trace 2 mm anterolisthesis of L4 on L5, chronic and facet
mediated. Alignment otherwise normal with preservation of the normal
lumbar lordosis.

Vertebrae: Vertebral body height maintained without acute or chronic
fracture. Bone marrow signal intensity within normal limits. 9 mm
T2/stir hyperintense lesion within the L1 vertebral body likely
reflects an atypical hemangioma. No other discrete or worrisome
osseous lesions. Mild reactive endplate changes about the L5-S1
interspace. Mild reactive marrow edema about the L4-5 and L5-S1
facets due to facet arthritis, which could contribute to underlying
back pain.

Conus medullaris and cauda equina: Conus extends to the L1 level.
Conus and cauda equina appear normal.

Paraspinal and other soft tissues: Paraspinous soft tissues grossly
within normal limits. Few scattered small simple cyst noted within
the kidneys bilaterally. Visualized visceral structures otherwise
unremarkable.

Disc levels:

L1-2: Minimal annular disc bulge with facet hypertrophy. No canal or
foraminal stenosis.

L2-3: Disc desiccation with mild annular disc bulge. Mild bilateral
facet hypertrophy. No spinal stenosis. Foramina remain patent.

L3-4: Disc desiccation with mild annular disc bulge. Mild bilateral
facet hypertrophy. No spinal stenosis. Mild bilateral L3 foraminal
narrowing without frank impingement.

L4-5: Trace anterolisthesis. Mild disc bulge with disc desiccation.
Moderate to advanced bilateral facet degeneration with associated
small joint effusions. Resultant mild canal with bilateral
subarticular stenosis, with mild to moderate bilateral L4 foraminal
narrowing.

L5-S1: Degenerative intervertebral disc space narrowing with diffuse
disc bulge and disc desiccation. Associated discogenic reactive
endplate change with marginal endplate osteophytic spurring. Broad
posterior disc osteophyte mildly flattens and indents the ventral
thecal sac. Slight caudad angulation of disc material noted. Mild
bilateral facet hypertrophy. No significant canal or lateral recess
stenosis. Moderate left worse than right L5 foraminal stenosis.
IMPRESSION: MR THORACIC SPINE IMPRESSION:

1. Normal MRI appearance of the thoracic spinal cord. No cord signal
changes to suggest myelopathy.
2. Exaggeration of the normal thoracic kyphosis with associated mild
multilevel degenerative disc desiccation and reactive endplate
changes, most pronounced at T5-6 through T9-10. No significant canal
or foraminal stenosis or evidence for neural impingement.

MR LUMBAR SPINE IMPRESSION:

1. Normal MRI appearance of the conus medullaris and nerve roots of
the cauda equina.
2. Degenerative disc disease with reactive endplate change at L5-S1
with resultant moderate left worse than right L5 foraminal stenosis.
3. Disc bulge with facet hypertrophy at L4-5 with resultant mild
canal and bilateral subarticular stenosis, with mild to moderate
bilateral L4 foraminal narrowing.
4. Mild reactive marrow edema about the L4-5 and L5-S1 facets due to
facet arthritis, which could contribute to underlying back pain.

## 2020-07-18 ENCOUNTER — Ambulatory Visit: Payer: Medicare Other | Admitting: Neurology

## 2020-07-19 ENCOUNTER — Telehealth: Payer: Self-pay

## 2020-07-19 DIAGNOSIS — G959 Disease of spinal cord, unspecified: Secondary | ICD-10-CM

## 2020-07-19 DIAGNOSIS — G1220 Motor neuron disease, unspecified: Secondary | ICD-10-CM

## 2020-07-19 NOTE — Telephone Encounter (Signed)
Called patient and informed him of results. Patient had a few questions and concerns before proceeding with the Cervical MRI.   Patient states that in-between his shoulder blades has been hurting a lot and is "hurting a lot after laying on that hard mri table for one hour." Patient wanted to know if the cervical MRI will cover in-between his shoulder blades? I advised patient that the cervical spine will look at his neck region. Informed patient that his thoracic MRI would have shown images between his shoulder blades. Patient informed me "I know I have neck damage and I have a vertebrae that pops out in between my shoulder blades." Patient states he knows he has issues with this vertebrae because he used to see a chiropractor that would always pop it back into place for him.  Patient would like to know if there was anything seen in between his shoulder blades. Advised patient I would send Dr. Posey Pronto a message and call him once I hear back.

## 2020-07-19 NOTE — Telephone Encounter (Signed)
-----   Message from Alda Berthold, DO sent at 07/19/2020  9:31 AM EST ----- Please let pt know that his MRI from his mid-back to the lumbar region surprisingly looks good - mild age-related changes, but no area of nerve compression to explain his pain and gait difficulty.  We need to check his upper neck region next, if agreeable, please order MRI cervical spine. Thanks.

## 2020-07-19 NOTE — Telephone Encounter (Signed)
I did not see anything causing pain in between his shoulder blades.  Since he has known problems with his neck, it is best to check MRI cervical spine wo contrast.

## 2020-07-20 ENCOUNTER — Telehealth: Payer: Self-pay | Admitting: Family Medicine

## 2020-07-20 NOTE — Telephone Encounter (Signed)
Patient scheduled new patient appointment on 09/08/20 with Dr.Cody.

## 2020-07-20 NOTE — Telephone Encounter (Signed)
I would like to take on new patients but I am closed at this point given my current patient volume.

## 2020-07-20 NOTE — Telephone Encounter (Signed)
Called patient and informed him of Dr. Serita Grit recommendations. Patient said to move forward with Cervical MRI. Patient is aware that PA will be done and once done [facility] will contact for scheduling.

## 2020-07-20 NOTE — Telephone Encounter (Signed)
Patient is requesting to see Dr.Duncan as a new patient.  I let patient know several times that Dr.Duncan isn't seeing new patients. Patient asked for me to send a message to Dr.Duncan to see if he would see him. Patient is a friend of [employer].  Patient said Dr.Duncan comes highly recommended. Please advise.

## 2020-07-26 ENCOUNTER — Other Ambulatory Visit: Payer: Self-pay | Admitting: Physician Assistant

## 2020-07-26 DIAGNOSIS — R1084 Generalized abdominal pain: Secondary | ICD-10-CM

## 2020-07-26 DIAGNOSIS — R634 Abnormal weight loss: Secondary | ICD-10-CM

## 2020-07-26 DIAGNOSIS — R194 Change in bowel habit: Secondary | ICD-10-CM | POA: Diagnosis not present

## 2020-08-01 DIAGNOSIS — L298 Other pruritus: Secondary | ICD-10-CM | POA: Diagnosis not present

## 2020-08-03 ENCOUNTER — Telehealth: Payer: Self-pay

## 2020-08-03 MED ORDER — GABAPENTIN 300 MG PO CAPS
300.0000 mg | ORAL_CAPSULE | Freq: Four times a day (QID) | ORAL | 1 refills | Status: DC
Start: 2020-08-03 — End: 2020-10-14

## 2020-08-03 NOTE — Telephone Encounter (Signed)
Please find out exactly how much gabapentin he is taking.  My last note says gabapentin 300mg  7am, 11am, 3pm, and 400mg  at bedtime. If this is what he is taking and he is tolerating it, we can increase to 400mg  four times daily (7a, 11a, 3p, and bedtime).

## 2020-08-03 NOTE — Telephone Encounter (Signed)
Called and spoke to patient and asked how he is taking his Gabapentin. Patient stated that he takes up to 600mg  4 times a day depending how he is feeling. Patient said he is experiencing a lot of pain. Put patient on hold and spoke with Dr. Posey Pronto informing her on patients pain and gabapentin dosage he has been taking. Per Dr. Posey Pronto she has informed me that patient can take his gabapentin 600 mg at 7 am, 600 mg at 11 am, 600 mg at 3 pm and 900 mg at bedtime.   Informed patient of new dosage per Dr. Posey Pronto. Patient was advised that another prescription would be sent to Pharmacy so he does not run out. Patient had no questions or concerns.   Called patients son Gwyndolyn Saxon at 2:38pm and informed him of patients new Gabapentin dosage and instructions per Dr. Posey Pronto. Patients son advised me that patient has an appointment on 08/08/20 with a new PCP to address his blood sugar concerns.

## 2020-08-03 NOTE — Telephone Encounter (Signed)
Patients son called the access nurse with concerns about his fathers pain. Spoke to Access nurse who connected me to patients son to be informed of concerns.  Patients son stated that his father has been complaining of pain that has been worsening and traveling up from his legs to his back, torso, across his shoulder blades, and stomach. Patients son stated that his father describes the pain as Stinging and burning. Patient stated to son that pain has been going on for a month and a half.   Patients son would like advice on this and also wants to know if his father can increase his gabapentin. Advised patients son that I would send Dr. Posey Pronto a message and give him a call back.

## 2020-08-11 ENCOUNTER — Other Ambulatory Visit: Payer: Self-pay

## 2020-08-11 ENCOUNTER — Ambulatory Visit
Admission: RE | Admit: 2020-08-11 | Discharge: 2020-08-11 | Disposition: A | Discharge status: Home / Self Care | Payer: Medicare Other | Source: Ambulatory Visit | Attending: Neurology | Admitting: Neurology

## 2020-08-11 DIAGNOSIS — G1220 Motor neuron disease, unspecified: Secondary | ICD-10-CM

## 2020-08-11 DIAGNOSIS — M4802 Spinal stenosis, cervical region: Secondary | ICD-10-CM | POA: Diagnosis not present

## 2020-08-11 DIAGNOSIS — M50221 Other cervical disc displacement at C4-C5 level: Secondary | ICD-10-CM | POA: Diagnosis not present

## 2020-08-11 DIAGNOSIS — M4803 Spinal stenosis, cervicothoracic region: Secondary | ICD-10-CM | POA: Diagnosis not present

## 2020-08-11 DIAGNOSIS — G959 Disease of spinal cord, unspecified: Secondary | ICD-10-CM

## 2020-08-11 IMAGING — MR MR CERVICAL SPINE W/O CM
4 of 6 series · 26 of 48 positions shown · non-contrast
Comparison: No pertinent prior exams available for comparison.

CLINICAL DATA: Myelopathy. Motor neuron disease. Disease of spinal
cord. Motor neuron disease; neck pain. Additional history provided
by scanning technologist: Patient reports history of "nerve damage,"
bilateral leg pain, neuropathy, upper back pain and neck pain for 8
months.

EXAM:
MRI CERVICAL SPINE WITHOUT CONTRAST
TECHNIQUE: Multiplanar, multisequence MR imaging of the cervical spine was
performed. No intravenous contrast was administered.

[Series 2: T2 · sagittal · 3.0mm · 0.82mm/px · 4 of 13 slices shown (1 of 2)]
[im 1/13]
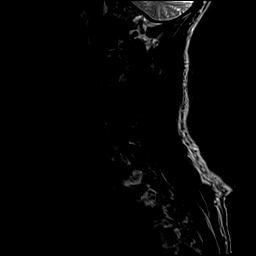
[im 5/13]
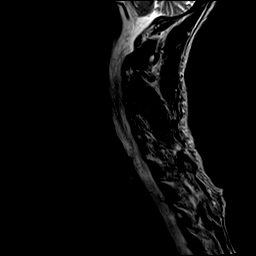
[im 9/13]
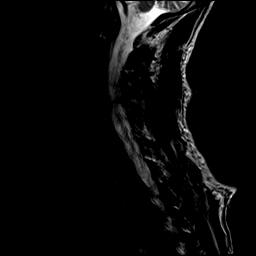
[im 13/13]
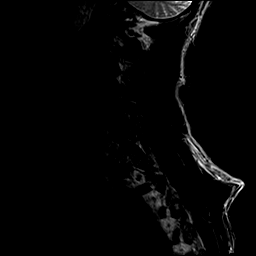

[Series 3: T1 · sagittal · 3.0mm · 0.41mm/px · 4 of 13 slices shown (1 of 2)]
[im 1/13]
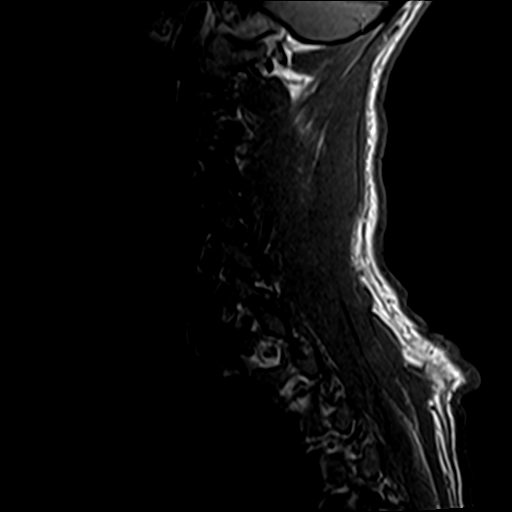
[im 5/13]
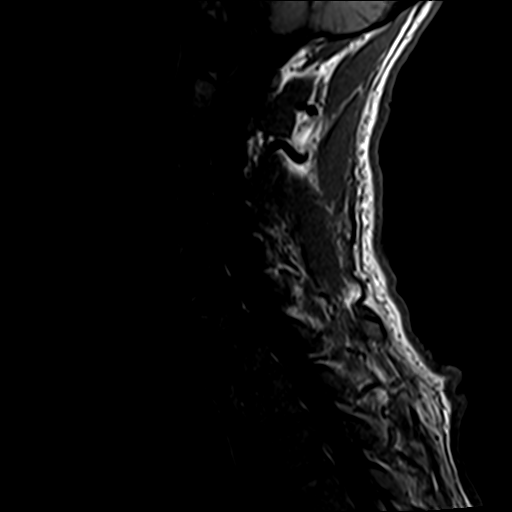
[im 9/13]
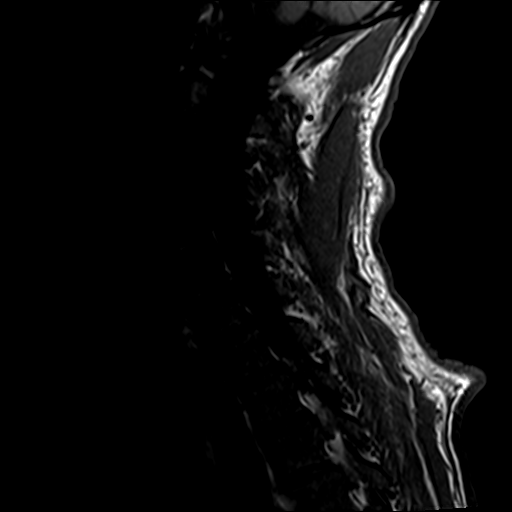
[im 13/13]
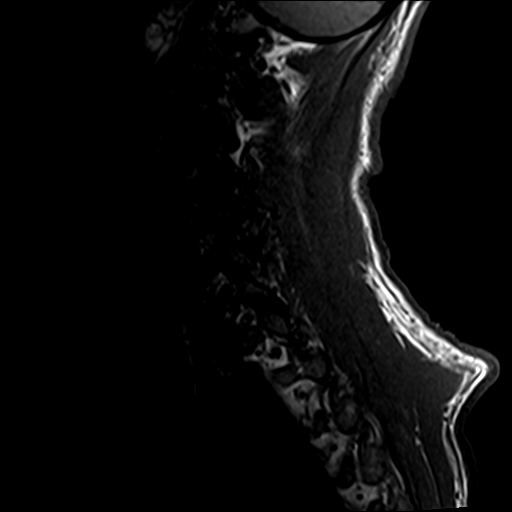

[Series 5: T2 · axial · 3.0mm · 0.70mm/px · z∈[-19,+115]mm · 9 of 37 slices shown (2 of 2)]
[im 1/37]
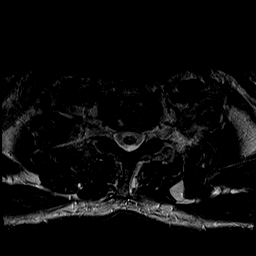
[im 7/37]
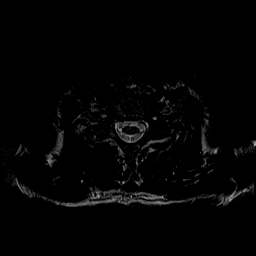
[im 10/37]
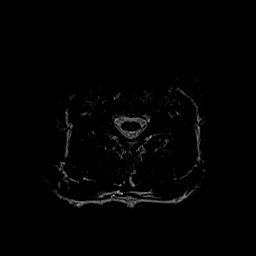
[im 17/37]
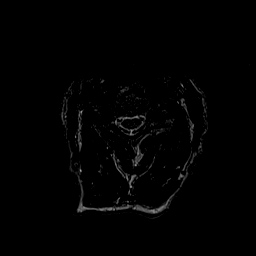
[im 20/37]
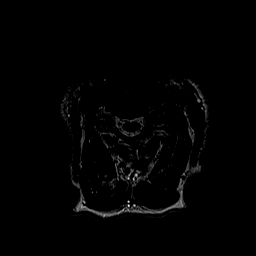
[im 27/37]
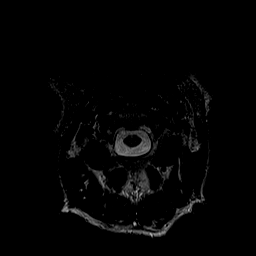
[im 30/37]
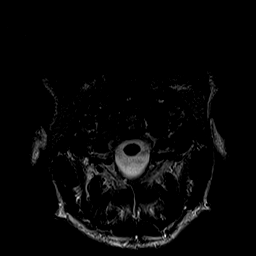
[im 33/37]
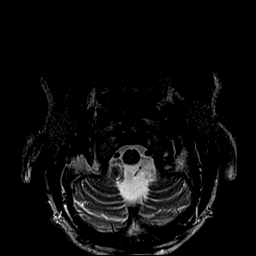
[im 37/37]
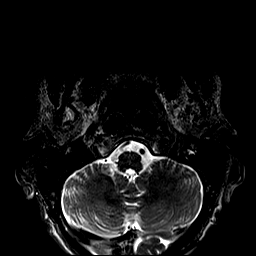

[Series 6: T1 · axial · 3.0mm · 0.35mm/px · z∈[-19,+115]mm · 9 of 38 slices shown (2 of 2)]
[im 1/38]
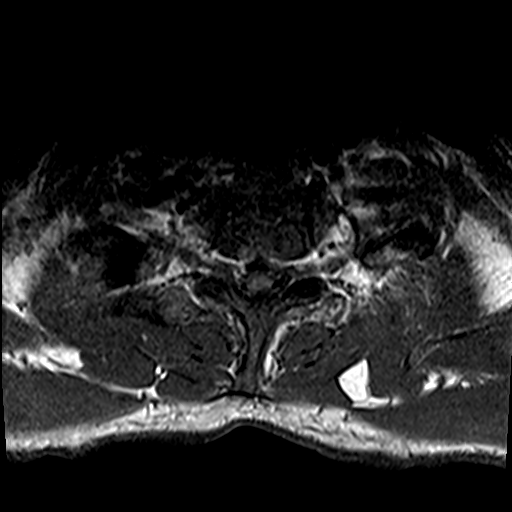
[im 7/38]
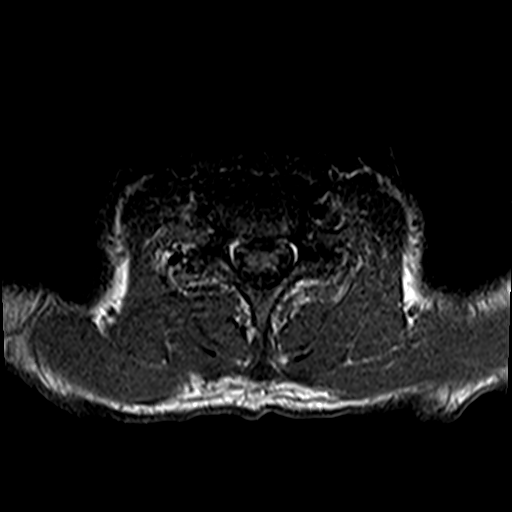
[im 11/38]
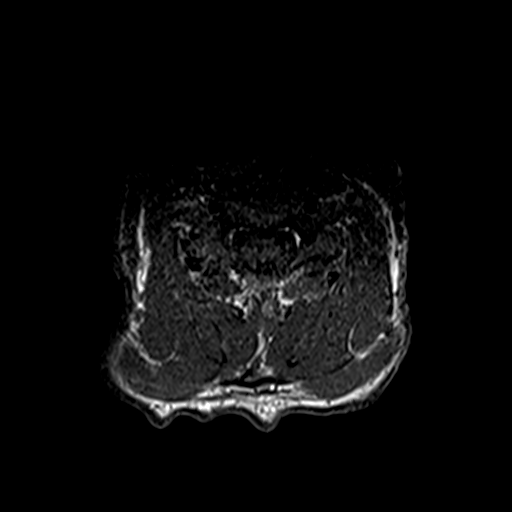
[im 17/38]
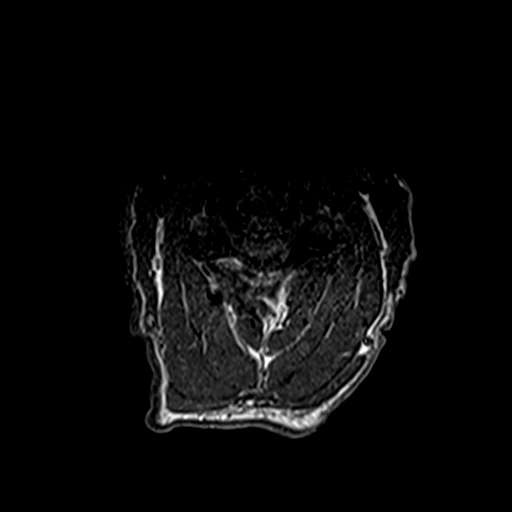
[im 21/38]
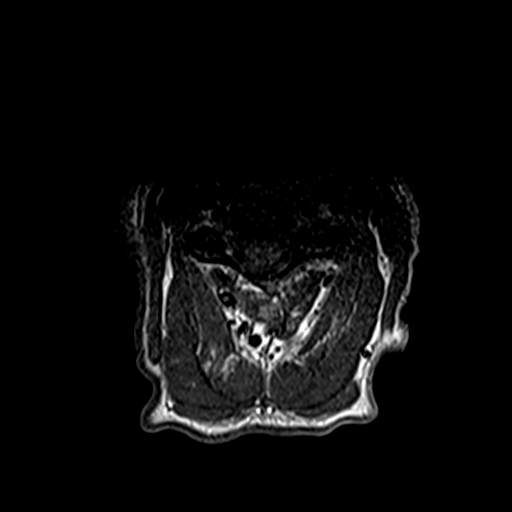
[im 27/38]
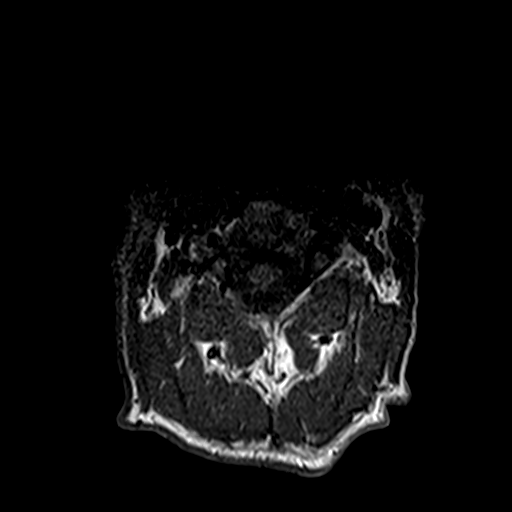
[im 31/38]
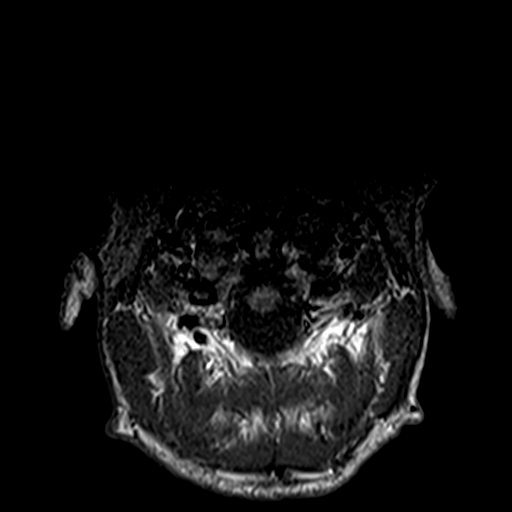
[im 34/38]
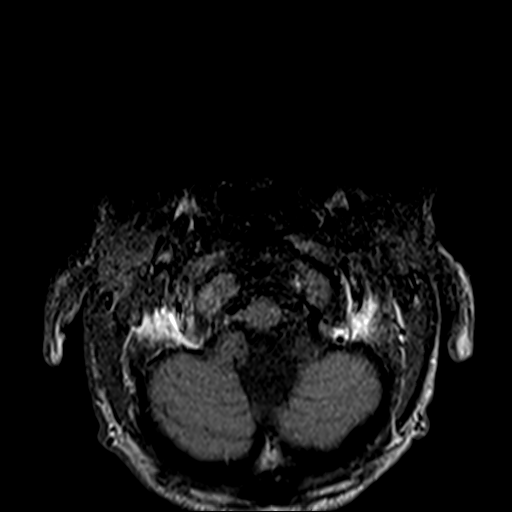
[im 38/38]
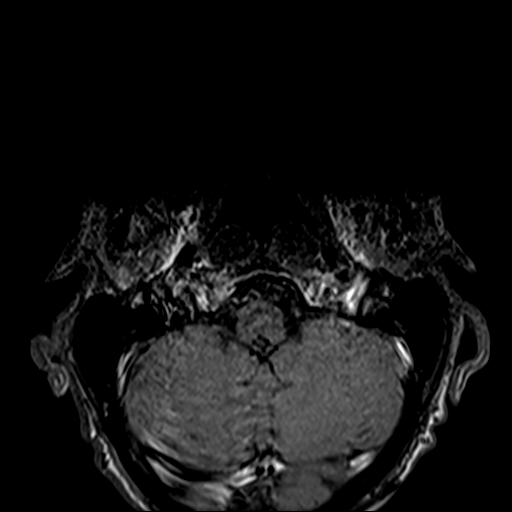

[26 of 48 positions shown; findings below may reference images not displayed]

FINDINGS: Multiple sequences are motion degraded, limiting evaluation. Most
notably, there is moderate motion degradation of the sagittal T2
weighted sequence and moderate motion degradation of the axial T1
weighted sequence.

Alignment: No significant spondylolisthesis.

Vertebrae: Vertebral body height is maintained. Mild degenerative
endplate marrow edema at C4-C5, C5-C6, C6-C7 and C7-T1.

Cord: Within the limitations of motion degradation, no spinal cord
signal abnormality is identified.

Posterior Fossa, vertebral arteries, paraspinal tissues: No
abnormality identified within included portions of the posterior
fossa. Flow voids preserved within the imaged cervical vertebral
arteries. There is nonspecific edema signal within the interspinous
spaces spanning the C4-C5 through T3-T4 levels.

Disc levels:

Multilevel disc degeneration. Most notably, there is moderate to
moderately advanced disc degeneration at C6-C7 and moderate disc
degeneration at C7-T1.

C2-C3: No significant disc herniation or spinal canal stenosis.
Facet hypertrophy (predominantly on the left). Mild left neural
foraminal narrowing.

C3-C4: No significant disc herniation or spinal canal stenosis.
Facet hypertrophy (greater on the left). Bilateral neural foraminal
narrowing (mild right, moderate left).

C4-C5: Shallow disc bulge. Right greater than left disc osteophyte
ridge/uncinate hypertrophy. Facet hypertrophy. Minimal partial
effacement of the ventral thecal sac with mild relative spinal canal
narrowing. Bilateral neural foraminal narrowing (severe right,
moderate left).

C5-C6: No significant disc herniation or spinal canal stenosis.
Facet hypertrophy (greater on the left). Mild/moderate left neural
foraminal narrowing.

C6-C7: Shallow disc bulge. Uncovertebral and facet hypertrophy. No
significant spinal canal stenosis. Bilateral neural foraminal
narrowing (mild/moderate right, moderate left).

C7-T1: Disc bulge. Uncovertebral and facet hypertrophy. No
significant spinal canal stenosis. Bilateral neural foraminal
narrowing (moderate right, moderate/severe left).
IMPRESSION: Examination limited by motion degradation.

Cervical spondylosis as described with findings most notably as
follows.

At C4-C5, a shallow disc bulge contributes to mild relative spinal
canal narrowing. No significant spinal canal stenosis at the
remaining levels.

Multilevel neural foraminal narrowing greatest on the left at C3-C4
(moderate), bilaterally at C4-C5 (severe right, moderate left), on
the left at C6-C7 (moderate) and bilaterally at C7-T1 (moderate
right, moderate/severe left).

Disc degeneration is greatest at C6-C7 (moderate to moderately
severe) and C7-T1 (moderate). Mild multilevel degenerative endplate
edema.

Nonspecific edema signal within the interspinous spaces at the C4-C5
through T3-T4 levels.

## 2020-08-18 ENCOUNTER — Ambulatory Visit
Admission: RE | Admit: 2020-08-18 | Discharge: 2020-08-18 | Disposition: A | Discharge status: Home / Self Care | Payer: Medicare Other | Source: Ambulatory Visit | Attending: Physician Assistant | Admitting: Physician Assistant

## 2020-08-18 ENCOUNTER — Other Ambulatory Visit: Payer: Self-pay

## 2020-08-18 DIAGNOSIS — R634 Abnormal weight loss: Secondary | ICD-10-CM | POA: Diagnosis not present

## 2020-08-18 DIAGNOSIS — R1084 Generalized abdominal pain: Secondary | ICD-10-CM

## 2020-08-18 DIAGNOSIS — K4020 Bilateral inguinal hernia, without obstruction or gangrene, not specified as recurrent: Secondary | ICD-10-CM | POA: Diagnosis not present

## 2020-08-18 DIAGNOSIS — N200 Calculus of kidney: Secondary | ICD-10-CM | POA: Diagnosis not present

## 2020-08-18 IMAGING — CT CT ABD-PELV W/ CM
1 of 4 series · 11 of 32 positions shown, 17 images · IV contrast (APPLIED)
Comparison: [DATE] and [DATE] CTs

CLINICAL DATA: 64-year-old male with unexplained weight loss and
abdominal pain.

EXAM:
CT ABDOMEN AND PELVIS WITH CONTRAST
TECHNIQUE: Multidetector CT imaging of the abdomen and pelvis was performed
using the standard protocol following bolus administration of
intravenous contrast.
CONTRAST:  100mL [D5] IOPAMIDOL ([D5]) INJECTION 61%

[Series 2: abd/pelvis w/cm · axial · 0.72mm/px · z∈[-418,-54]mm · 11 of 89 slices shown, 17 images]
[im 8/89  soft-tissue]
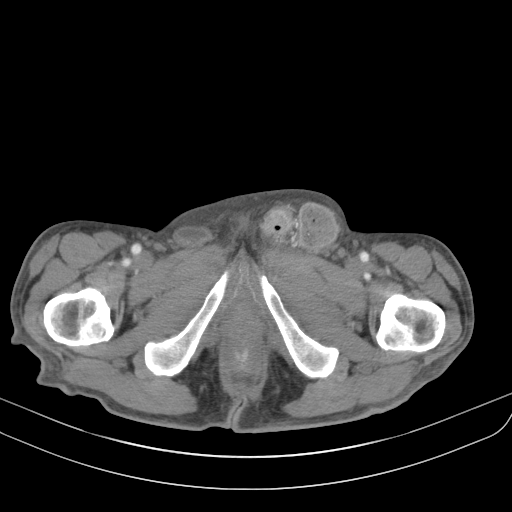
[im 8/89  bone]
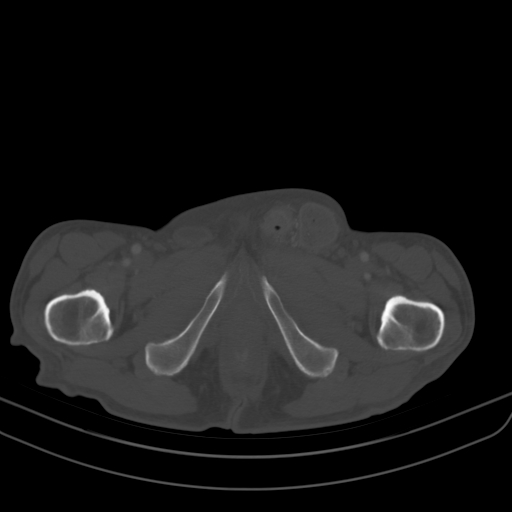
[im 15/89  soft-tissue]
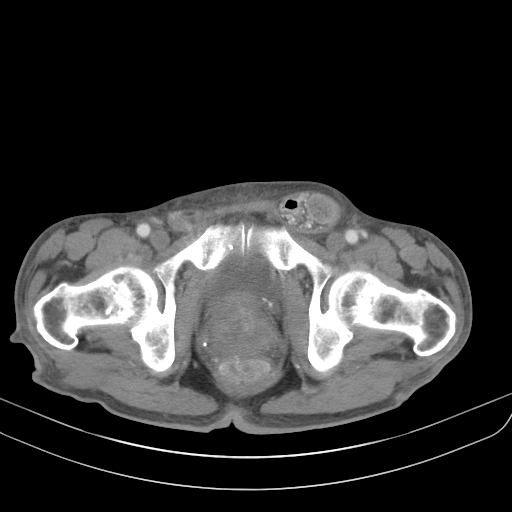
[im 23/89  soft-tissue]
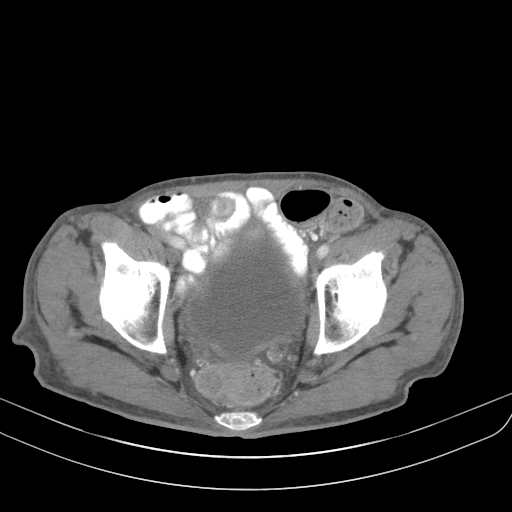
[im 30/89  soft-tissue]
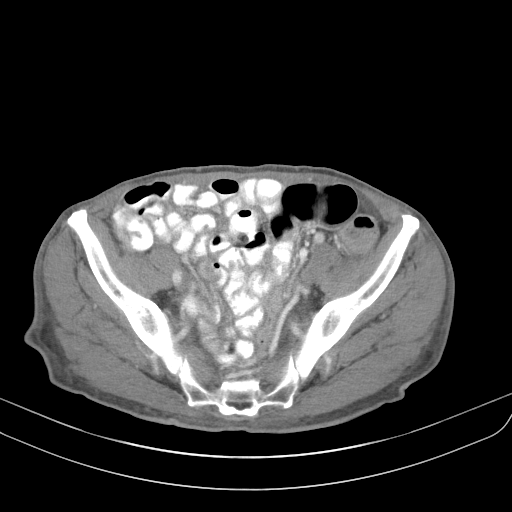
[im 37/89  soft-tissue]
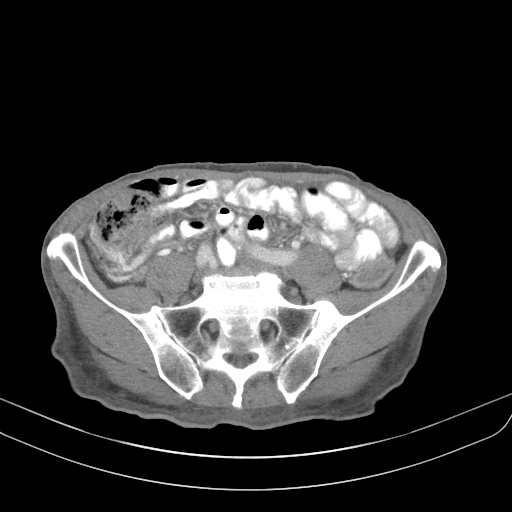
[im 45/89  soft-tissue]
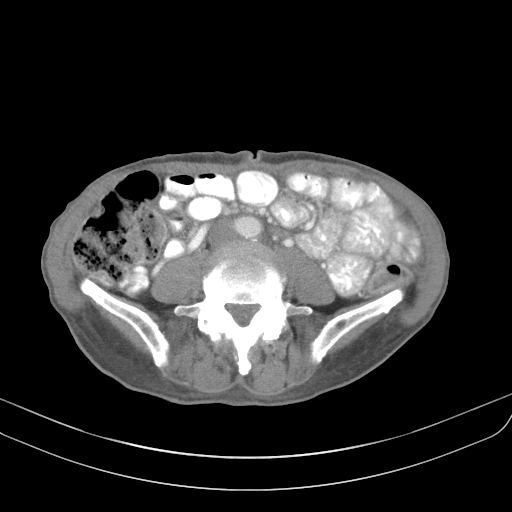
[im 52/89  soft-tissue]
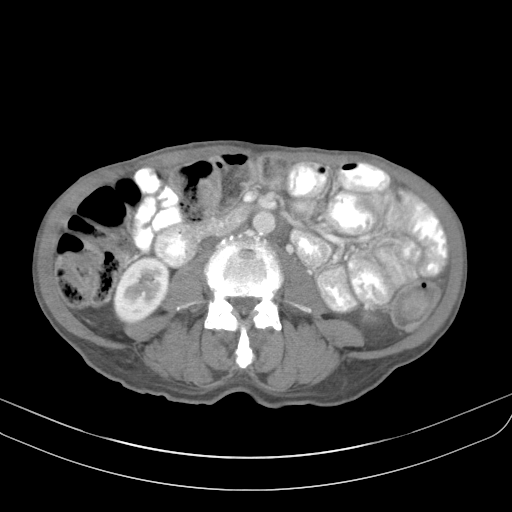
[im 59/89  soft-tissue]
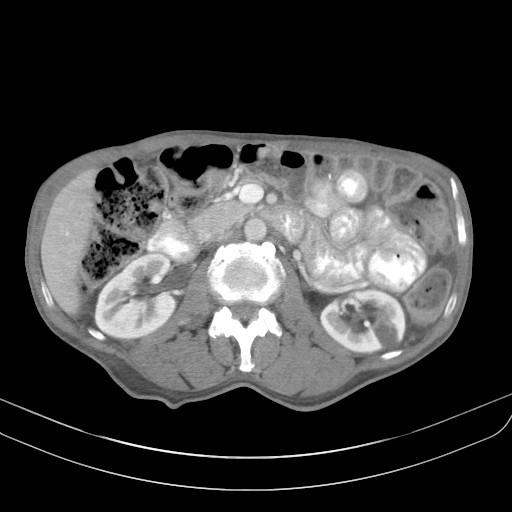
[im 59/89  lung]
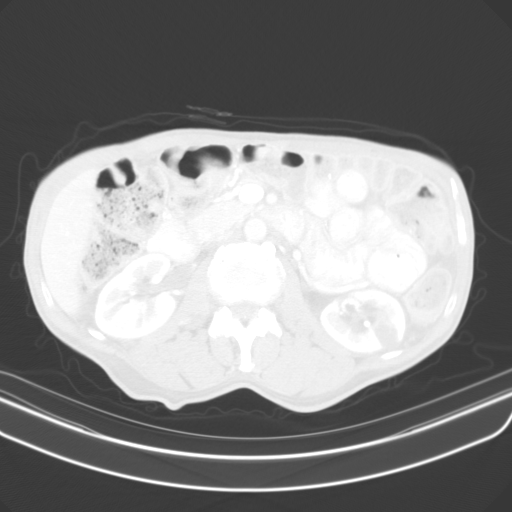
[im 67/89  soft-tissue]
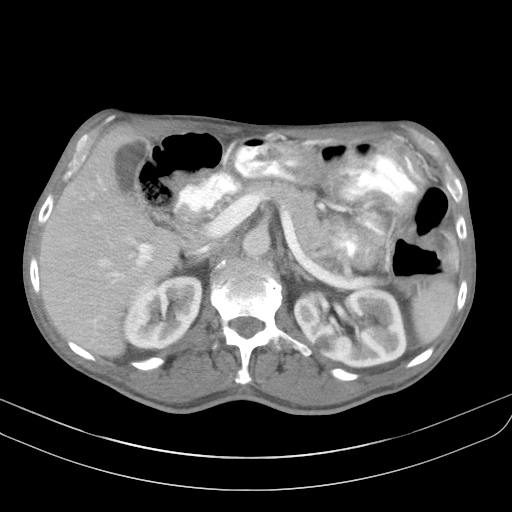
[im 67/89  lung]
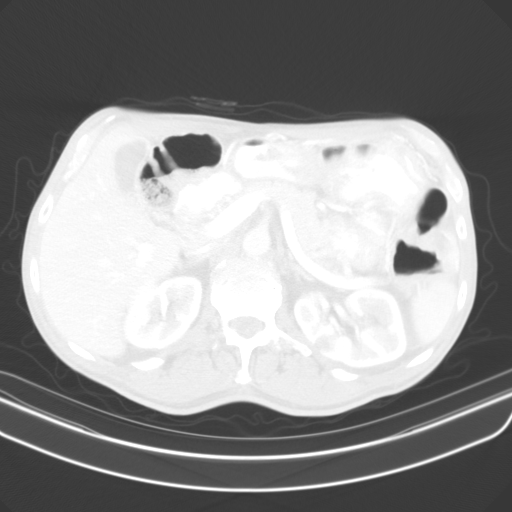
[im 67/89  bone]
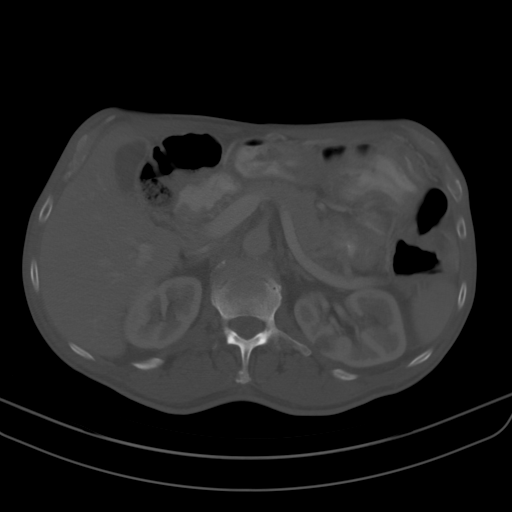
[im 74/89  soft-tissue]
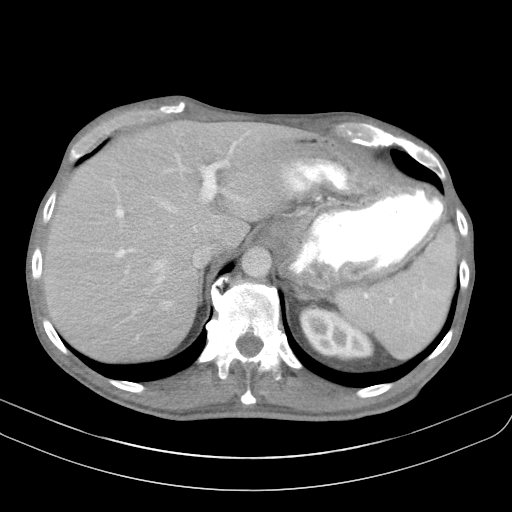
[im 74/89  lung]
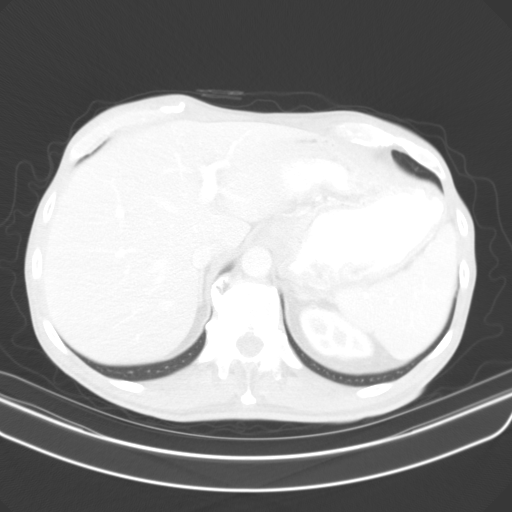
[im 81/89  soft-tissue]
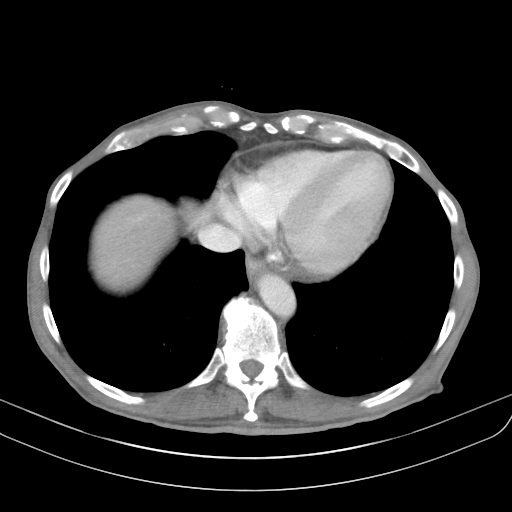
[im 81/89  lung]
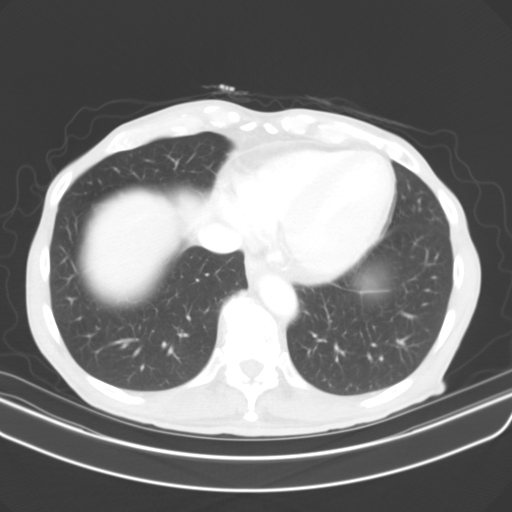

[11 of 32 positions shown; findings below may reference images not displayed]

FINDINGS: Lower chest: No acute abnormality.

Hepatobiliary: The liver is unremarkable. Cholelithiasis identified
without definite CT evidence of acute cholecystitis. No biliary
dilatation identified.

Pancreas: Unremarkable

Spleen: Unremarkable

Adrenals/Urinary Tract: Nonobstructing renal calculi are identified
within both LOWER kidneys, a 6 mm calculus on the RIGHT and a 5 mm
calculus on the LEFT. Bilateral renal cysts are present. There is no
evidence of hydronephrosis or obstructing urinary calculi. The
adrenal glands and bladder are unremarkable.

Stomach/Bowel: There is equivocal mild circumferential wall
thickening of the duodenum and jejunum. There is no evidence of
bowel obstruction. A large LEFT inguinal hernia containing colon is
identified. There is a moderate amount of stool within the colon and
rectum

The appendix is normal.

Vascular/Lymphatic: Aortic atherosclerosis. No enlarged abdominal or
pelvic lymph nodes.

Reproductive: Prostate enlargement is again noted.

Other: Bilateral inguinal hernias are present with the LEFT inguinal
hernia containing colon. No ascites, focal collection or
pneumoperitoneum identified.

Musculoskeletal: No acute or suspicious bony abnormalities are
identified. Degenerative changes within the lumbar spine are again
identified.
IMPRESSION: 1. Equivocal mild circumferential wall thickening of the duodenum
and jejunum - possibly representing an enteritis or small bowel
process. No evidence of bowel obstruction or pneumoperitoneum.
2. Bilateral inguinal hernias, large on the LEFT which contains a
portion colon. No evidence of bowel obstruction.
3. Nonobstructing bilateral renal calculi.
4. Cholelithiasis without definite CT evidence of acute
cholecystitis.
5. Prostate enlargement.
6. Aortic Atherosclerosis ([D5]-[D5]).

## 2020-08-18 MED ORDER — IOPAMIDOL (ISOVUE-300) INJECTION 61%
100.0000 mL | Freq: Once | INTRAVENOUS | Status: AC | PRN
  Administered 2020-08-18: 100 mL via INTRAVENOUS

## 2020-08-24 DIAGNOSIS — K4020 Bilateral inguinal hernia, without obstruction or gangrene, not specified as recurrent: Secondary | ICD-10-CM | POA: Diagnosis not present

## 2020-08-24 DIAGNOSIS — R198 Other specified symptoms and signs involving the digestive system and abdomen: Secondary | ICD-10-CM | POA: Diagnosis not present

## 2020-08-24 DIAGNOSIS — Z7984 Long term (current) use of oral hypoglycemic drugs: Secondary | ICD-10-CM | POA: Diagnosis not present

## 2020-08-24 DIAGNOSIS — R933 Abnormal findings on diagnostic imaging of other parts of digestive tract: Secondary | ICD-10-CM | POA: Diagnosis not present

## 2020-08-24 DIAGNOSIS — E119 Type 2 diabetes mellitus without complications: Secondary | ICD-10-CM | POA: Diagnosis not present

## 2020-08-24 DIAGNOSIS — R1012 Left upper quadrant pain: Secondary | ICD-10-CM | POA: Diagnosis not present

## 2020-08-24 DIAGNOSIS — K8020 Calculus of gallbladder without cholecystitis without obstruction: Secondary | ICD-10-CM | POA: Diagnosis not present

## 2020-08-24 DIAGNOSIS — R634 Abnormal weight loss: Secondary | ICD-10-CM | POA: Diagnosis not present

## 2020-08-24 DIAGNOSIS — K52832 Lymphocytic colitis: Secondary | ICD-10-CM | POA: Diagnosis not present

## 2020-08-24 DIAGNOSIS — K561 Intussusception: Secondary | ICD-10-CM | POA: Diagnosis not present

## 2020-08-24 DIAGNOSIS — N200 Calculus of kidney: Secondary | ICD-10-CM | POA: Diagnosis not present

## 2020-08-26 ENCOUNTER — Telehealth: Payer: Self-pay | Admitting: Neurology

## 2020-08-26 DIAGNOSIS — R292 Abnormal reflex: Secondary | ICD-10-CM

## 2020-08-26 DIAGNOSIS — R29898 Other symptoms and signs involving the musculoskeletal system: Secondary | ICD-10-CM

## 2020-08-26 DIAGNOSIS — R202 Paresthesia of skin: Secondary | ICD-10-CM

## 2020-08-26 DIAGNOSIS — G959 Disease of spinal cord, unspecified: Secondary | ICD-10-CM

## 2020-08-26 NOTE — Telephone Encounter (Signed)
Patient called to ask if Dr Posey Pronto has gotten the results from the imaging he had done on 08/18/20? He hasn't heard from the office yet. Please call.

## 2020-08-26 NOTE — Telephone Encounter (Signed)
I called and discussed results of imaging with patient.  MRI cervical spine shows degenerative changes with multilevel foraminal stenosis, but no severe canal stenosis to explain his myelopathic findings. MRI cervical, thoracic, and lumbar spine does not demonstrate significant spinal canal stenosis at any of these levels.  Therefore, I will look higher and get MRI brain wwo contrast.  If this is negative, next step is EMG.

## 2020-08-29 ENCOUNTER — Other Ambulatory Visit: Payer: Self-pay | Admitting: Neurology

## 2020-08-29 ENCOUNTER — Other Ambulatory Visit: Payer: Self-pay

## 2020-08-29 DIAGNOSIS — G1220 Motor neuron disease, unspecified: Secondary | ICD-10-CM

## 2020-08-29 DIAGNOSIS — G959 Disease of spinal cord, unspecified: Secondary | ICD-10-CM

## 2020-08-29 DIAGNOSIS — R29898 Other symptoms and signs involving the musculoskeletal system: Secondary | ICD-10-CM

## 2020-08-29 DIAGNOSIS — R202 Paresthesia of skin: Secondary | ICD-10-CM

## 2020-09-08 ENCOUNTER — Encounter: Payer: Self-pay | Admitting: Family Medicine

## 2020-09-08 ENCOUNTER — Ambulatory Visit (INDEPENDENT_AMBULATORY_CARE_PROVIDER_SITE_OTHER): Payer: Medicare Other | Admitting: Family Medicine

## 2020-09-08 ENCOUNTER — Other Ambulatory Visit: Payer: Self-pay

## 2020-09-08 VITALS — BP 156/80 | HR 81 | Temp 98.7°F | Ht 67.0 in | Wt 131.8 lb

## 2020-09-08 DIAGNOSIS — E119 Type 2 diabetes mellitus without complications: Secondary | ICD-10-CM | POA: Diagnosis not present

## 2020-09-08 DIAGNOSIS — I10 Essential (primary) hypertension: Secondary | ICD-10-CM

## 2020-09-08 DIAGNOSIS — I700 Atherosclerosis of aorta: Secondary | ICD-10-CM | POA: Diagnosis not present

## 2020-09-08 DIAGNOSIS — G629 Polyneuropathy, unspecified: Secondary | ICD-10-CM | POA: Diagnosis not present

## 2020-09-08 DIAGNOSIS — Z720 Tobacco use: Secondary | ICD-10-CM

## 2020-09-08 DIAGNOSIS — I498 Other specified cardiac arrhythmias: Secondary | ICD-10-CM | POA: Insufficient documentation

## 2020-09-08 DIAGNOSIS — E1169 Type 2 diabetes mellitus with other specified complication: Secondary | ICD-10-CM | POA: Diagnosis not present

## 2020-09-08 DIAGNOSIS — K52832 Lymphocytic colitis: Secondary | ICD-10-CM | POA: Diagnosis not present

## 2020-09-08 DIAGNOSIS — H269 Unspecified cataract: Secondary | ICD-10-CM | POA: Insufficient documentation

## 2020-09-08 DIAGNOSIS — G729 Myopathy, unspecified: Secondary | ICD-10-CM

## 2020-09-08 LAB — COMPREHENSIVE METABOLIC PANEL
ALT: 10 U/L (ref 0–53)
AST: 12 U/L (ref 0–37)
Albumin: 4 g/dL (ref 3.5–5.2)
Alkaline Phosphatase: 47 U/L (ref 39–117)
BUN: 14 mg/dL (ref 6–23)
CO2: 39 mEq/L — ABNORMAL HIGH (ref 19–32)
Calcium: 9.3 mg/dL (ref 8.4–10.5)
Chloride: 101 mEq/L (ref 96–112)
Creatinine, Ser: 0.9 mg/dL (ref 0.40–1.50)
GFR: 89.52 mL/min (ref >60.00)
Glucose, Bld: 100 mg/dL — ABNORMAL HIGH (ref 70–99)
Potassium: 3.1 mEq/L — ABNORMAL LOW (ref 3.5–5.1)
Sodium: 143 mEq/L (ref 135–145)
Total Bilirubin: 1.2 mg/dL (ref 0.2–1.2)
Total Protein: 6 g/dL (ref 6.0–8.3)

## 2020-09-08 LAB — CBC
HCT: 38.2 % — ABNORMAL LOW (ref 39.0–52.0)
Hemoglobin: 13.5 g/dL (ref 13.0–17.0)
MCHC: 35.3 g/dL (ref 30.0–36.0)
MCV: 93.7 fl (ref 78.0–100.0)
Platelets: 200 10*3/uL (ref 150.0–400.0)
RBC: 4.08 Mil/uL — ABNORMAL LOW (ref 4.22–5.81)
RDW: 13.2 % (ref 11.5–15.5)
WBC: 8.3 10*3/uL (ref 4.0–10.5)

## 2020-09-08 LAB — LIPID PANEL
Cholesterol: 171 mg/dL (ref 0–200)
HDL: 45.5 mg/dL (ref >39.00)
LDL Cholesterol: 111 mg/dL — ABNORMAL HIGH (ref 0–99)
NonHDL: 125.24
Total CHOL/HDL Ratio: 4
Triglycerides: 73 mg/dL (ref 0.0–149.0)
VLDL: 14.6 mg/dL (ref 0.0–40.0)

## 2020-09-08 LAB — POCT GLYCOSYLATED HEMOGLOBIN (HGB A1C): Hemoglobin A1C: 7.3 % — AB (ref 4.0–5.6)

## 2020-09-08 NOTE — Progress Notes (Signed)
Subjective:     Kyle Ramsey is a 65 y.o. male presenting for Establish Care     HPI  #Diabetes - failed: metformin (GI upset), glimepiride, glipizide (low sugar) - not sure if he tried Januvia - was having some balance issues prior to going to the doctor - did cut out basically all food initially with drastic weight loss - after starting medication and getting chronic diarrhea  Weight loss has stabilized  Typical day Breakfast: 2 eggs, small amount of hashbrowns, tomatoes, whole wheat toast [employer] steak biscuit (1/2 side) No sugar, sweets, soda, sweet tea  Lunch: occasional cheese burger with 1/2 bun  Typically 2 meals a day - low carb tv dinner  Checking sugar at home 150-180 Sugar 1 hour after 250-300    #HTN - not willing to try medication  Review of Systems   Social History   Tobacco Use  Smoking Status Current Every Day Smoker  . Packs/day: 0.50  . Years: 40.00  . Pack years: 20.00  . Types: Cigarettes  Smokeless Tobacco Never Used  Tobacco Comment   1 ppd 40 years, cut back to 0.5 ppd        Objective:    BP Readings from Last 3 Encounters:  09/08/20 (!) 156/80  06/30/20 (!) 163/97  06/15/19 (!) 191/99   Wt Readings from Last 3 Encounters:  09/08/20 131 lb 12 oz (59.8 kg)  06/30/20 133 lb (60.3 kg)  06/14/19 165 lb (74.8 kg)    BP (!) 156/80   Pulse 81   Temp 98.7 F (37.1 C) (Temporal)   Ht 5\' 7"  (1.702 m)   Wt 131 lb 12 oz (59.8 kg)   SpO2 100%   BMI 20.63 kg/m    Physical Exam Constitutional:      Appearance: Normal appearance. He is not ill-appearing or diaphoretic.  HENT:     Right Ear: External ear normal.     Left Ear: External ear normal.  Eyes:     General: No scleral icterus.    Extraocular Movements: Extraocular movements intact.     Conjunctiva/sclera: Conjunctivae normal.  Cardiovascular:     Rate and Rhythm: Normal rate and regular rhythm.     Heart sounds: No murmur heard.   Pulmonary:      Effort: Pulmonary effort is normal. No respiratory distress.     Breath sounds: Normal breath sounds. No wheezing.  Musculoskeletal:     Cervical back: Neck supple.  Skin:    General: Skin is warm and dry.  Neurological:     Mental Status: He is alert. Mental status is at baseline.  Psychiatric:        Mood and Affect: Mood normal.           Assessment & Plan:   Problem List Items Addressed This Visit      Cardiovascular and Mediastinum   Essential hypertension    Declined treatment at this time. Understands increased risk for MI/stroke. Will readdress in the future      Relevant Orders   Comprehensive metabolic panel   CBC   Aortic atherosclerosis ([place])    Noted after visit. Will discuss statin at next OV. Labs today        Digestive   Lymphocytic colitis    Continues to have diarrhea. Will go back and forth due to treatment with lomotil. Request records. Cont GI support      Relevant Orders   CBC     Endocrine   Type  2 diabetes mellitus with other specified complication ([place]) - Primary    Overall improved - seems it was 11 at diagnosis but he has had significant weight loss. Discussed Januvia but got an alert this would be >$200 for 3 month supply. Given hx of intolerance referral to CCM to help medication selection and samples if able to decrease cost before finding the right medication. Cont diet.       Relevant Orders   AMB Referral to [facility] Coordinaton     Nervous and Auditory   Neuropathy    On gabapentin which is helping. Following with neurology        Other   Cataract    Following with Dr. Katy Fitch      Myopathy    Following with Dr. Posey Pronto. Gabapentin being titrated. MRI spine with DDD but otherwise no source. Is planning brain MRI. Appreciate neurology support      Tobacco abuse    Encouraged continuing to cut back. Candidate for CT screening.           Return in about 3 months (around 12/07/2020) for Diabetes.  Lesleigh Noe, MD  This visit occurred during the SARS-CoV-2 public health emergency.  Safety protocols were in place, including screening questions prior to the visit, additional usage of staff PPE, and extensive cleaning of exam room while observing appropriate contact time as indicated for disinfecting solutions.

## 2020-09-08 NOTE — Assessment & Plan Note (Signed)
Following with Dr. Posey Pronto. Gabapentin being titrated. MRI spine with DDD but otherwise no source. Is planning brain MRI. Appreciate neurology support

## 2020-09-08 NOTE — Assessment & Plan Note (Addendum)
Continues to have diarrhea. Will go back and forth due to treatment with lomotil. Request records. Cont GI support

## 2020-09-08 NOTE — Assessment & Plan Note (Signed)
On gabapentin which is helping. Following with neurology

## 2020-09-08 NOTE — Assessment & Plan Note (Signed)
Following with Dr. Katy Fitch

## 2020-09-08 NOTE — Patient Instructions (Addendum)
Lab Results  Component Value Date   HGBA1C 7.3 (A) 09/08/2020   Referral to our pharmacist

## 2020-09-08 NOTE — Assessment & Plan Note (Signed)
Declined treatment at this time. Understands increased risk for MI/stroke. Will readdress in the future

## 2020-09-08 NOTE — Assessment & Plan Note (Signed)
Encouraged continuing to cut back. Candidate for CT screening.

## 2020-09-08 NOTE — Assessment & Plan Note (Signed)
Noted after visit. Will discuss statin at next OV. Labs today

## 2020-09-08 NOTE — Assessment & Plan Note (Signed)
Overall improved - seems it was 11 at diagnosis but he has had significant weight loss. Discussed Januvia but got an alert this would be >$200 for 3 month supply. Given hx of intolerance referral to CCM to help medication selection and samples if able to decrease cost before finding the right medication. Cont diet.

## 2020-09-09 ENCOUNTER — Telehealth: Payer: Self-pay | Admitting: Family Medicine

## 2020-09-09 NOTE — Chronic Care Management (AMB) (Signed)
  Chronic Care Management   Note  09/09/2020 Name: Kyle Ramsey MRN: [id number] DOB: Dec 13, 1955  Kyle Ramsey is a 66 y.o. year old male who is a primary care patient of Lesleigh Noe, MD. I reached out to [name] by phone today in response to a referral sent by [name] PCP, Lesleigh Noe, MD.   [name] was given information about Chronic Care Management services today including:  1. CCM service includes personalized support from designated clinical staff supervised by his physician, including individualized plan of care and coordination with other care providers 2. 24/7 contact phone numbers for assistance for urgent and routine care needs. 3. Service will only be billed when office clinical staff spend 20 minutes or more in a month to coordinate care. 4. Only one practitioner may furnish and bill the service in a calendar month. 5. The patient may stop CCM services at any time (effective at the end of the month) by phone call to the office staff.   Gwyndolyn Saxon verbally agreed to assistance and services provided by embedded care coordination/care management team today.  Follow up plan:   [place]

## 2020-09-13 DIAGNOSIS — R634 Abnormal weight loss: Secondary | ICD-10-CM | POA: Diagnosis not present

## 2020-09-13 DIAGNOSIS — K4020 Bilateral inguinal hernia, without obstruction or gangrene, not specified as recurrent: Secondary | ICD-10-CM | POA: Diagnosis not present

## 2020-09-13 HISTORY — PX: HERNIA REPAIR: SHX51

## 2020-09-14 ENCOUNTER — Ambulatory Visit
Admission: RE | Admit: 2020-09-14 | Discharge: 2020-09-14 | Disposition: A | Discharge status: Home / Self Care | Payer: Medicare Other | Source: Ambulatory Visit | Attending: Neurology | Admitting: Neurology

## 2020-09-14 ENCOUNTER — Telehealth: Payer: Self-pay

## 2020-09-14 ENCOUNTER — Other Ambulatory Visit: Payer: Self-pay

## 2020-09-14 DIAGNOSIS — H748X2 Other specified disorders of left middle ear and mastoid: Secondary | ICD-10-CM | POA: Diagnosis not present

## 2020-09-14 DIAGNOSIS — R202 Paresthesia of skin: Secondary | ICD-10-CM | POA: Diagnosis not present

## 2020-09-14 DIAGNOSIS — G1220 Motor neuron disease, unspecified: Secondary | ICD-10-CM

## 2020-09-14 DIAGNOSIS — G959 Disease of spinal cord, unspecified: Secondary | ICD-10-CM

## 2020-09-14 DIAGNOSIS — J3489 Other specified disorders of nose and nasal sinuses: Secondary | ICD-10-CM | POA: Diagnosis not present

## 2020-09-14 DIAGNOSIS — R29898 Other symptoms and signs involving the musculoskeletal system: Secondary | ICD-10-CM

## 2020-09-14 DIAGNOSIS — R531 Weakness: Secondary | ICD-10-CM | POA: Diagnosis not present

## 2020-09-14 IMAGING — MR MR HEAD WO/W CM
12 series · 48 of 48 positions shown · IV contrast (multihance)
Comparison: None.

CLINICAL DATA: Moderate neuron disease. Leg weakness, bilateral.
Paresthesia. Myelopathy.

EXAM:
MRI HEAD WITHOUT AND WITH CONTRAST
TECHNIQUE: Multiplanar, multiecho pulse sequences of the brain and surrounding
structures were obtained without and with intravenous contrast.
CONTRAST:  15mL MULTIHANCE GADOBENATE DIMEGLUMINE 529 MG/ML IV SOLN

[Series 2: T1 · sagittal · 5.0mm · 0.45mm/px · 2 of 22 slices shown]
[im 1/22]
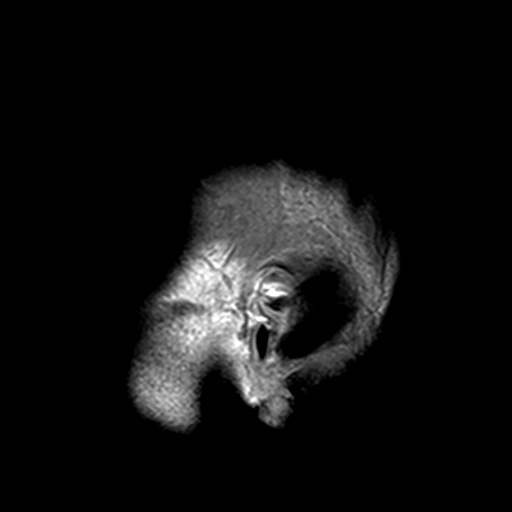
[im 22/22]
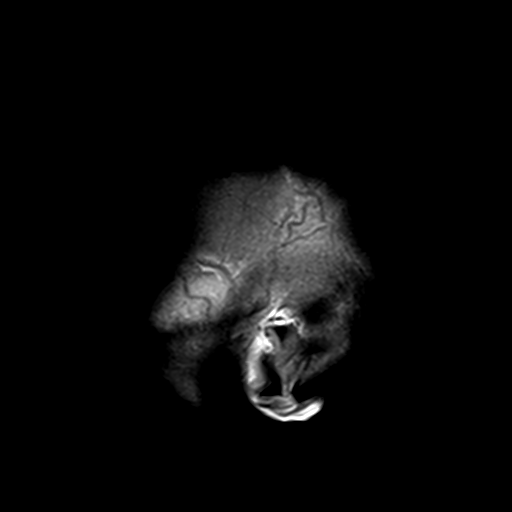

[Series 3: DWI · axial · 3.0mm · 1.80mm/px · z∈[-55,+91]mm · 7 of 100 slices shown (1 of 4)]
[im 1/100]
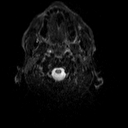
[im 17/100]
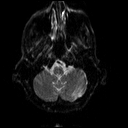
[im 34/100]
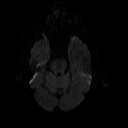
[im 50/100]
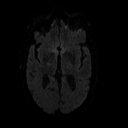
[im 67/100]
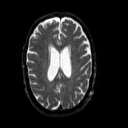
[im 83/100]
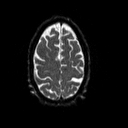
[im 100/100]
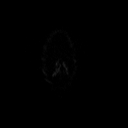

[Series 4: DWI · axial · 3.0mm · 1.80mm/px · z∈[-55,+91]mm · 3 of 48 slices shown (2 of 4)]
[im 1/48]
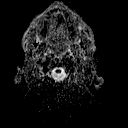
[im 24/48]
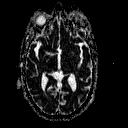
[im 48/48]
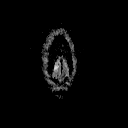

[Series 5: DWI · coronal · 5.0mm · 1.80mm/px · 5 of 72 slices shown (3 of 4)]
[im 1/72]
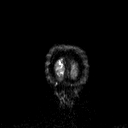
[im 18/72]
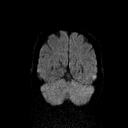
[im 36/72]
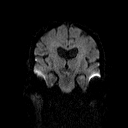
[im 54/72]
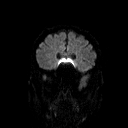
[im 72/72]
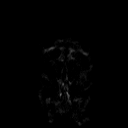

[Series 6: DWI · coronal · 5.0mm · 1.80mm/px · 2 of 36 slices shown (4 of 4)]
[im 1/36]
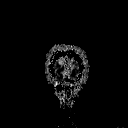
[im 36/36]
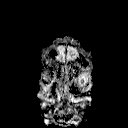

[Series 7: T2 · axial · 5.0mm · 0.90mm/px · 1 of 22 slices shown (1 of 2)]
[im 1/22]
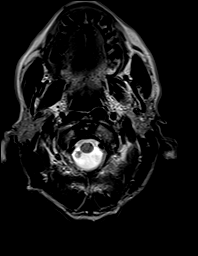

[Series 8: FLAIR · axial · 3.0mm · 0.45mm/px · z∈[-49,+85]mm · 2 of 30 slices shown]
[im 1/30]
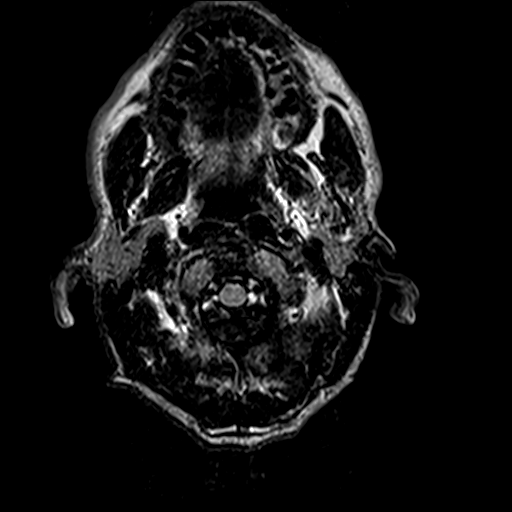
[im 30/30]
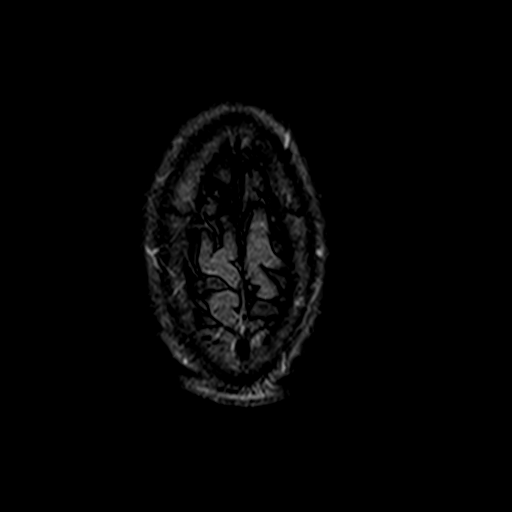

[Series 10: swi_images · axial · 4.0mm · 0.90mm/px · z∈[-52,+88]mm · 2 of 36 slices shown]
[im 1/36]
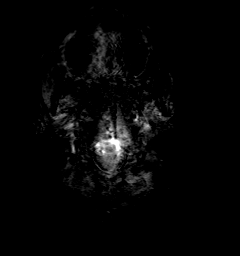
[im 36/36]
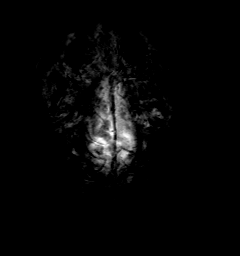

[Series 11: t1_mpr_tra · axial · 1.0mm · 0.75mm/px · z∈[-44,+98]mm · 10 of 144 slices shown]
[im 1/144]
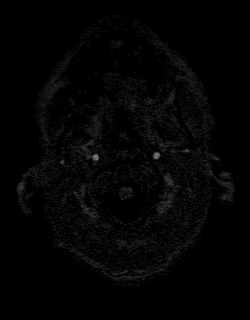
[im 16/144]
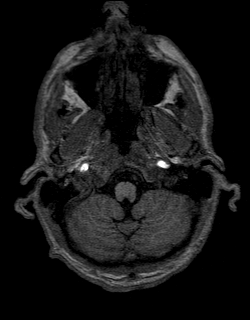
[im 32/144]
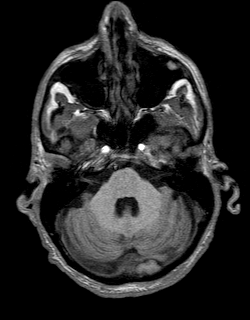
[im 48/144]
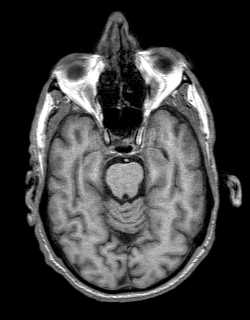
[im 64/144]
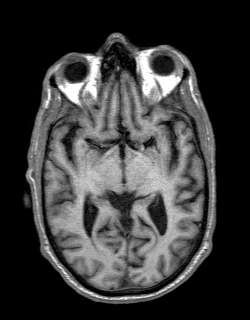
[im 80/144]
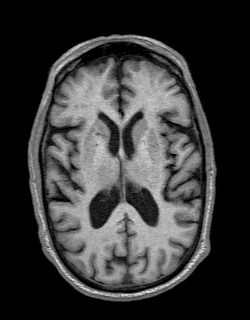
[im 96/144]
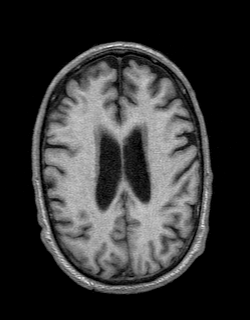
[im 112/144]
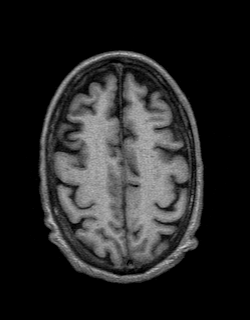
[im 128/144]
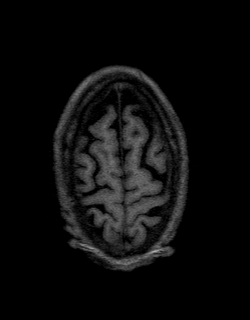
[im 144/144]
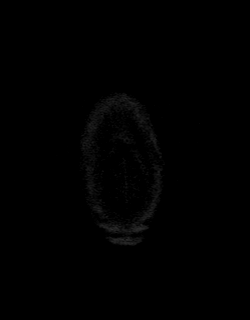

[Series 12: T2 · coronal · 5.0mm · 0.45mm/px · 2 of 28 slices shown (2 of 2)]
[im 1/28]
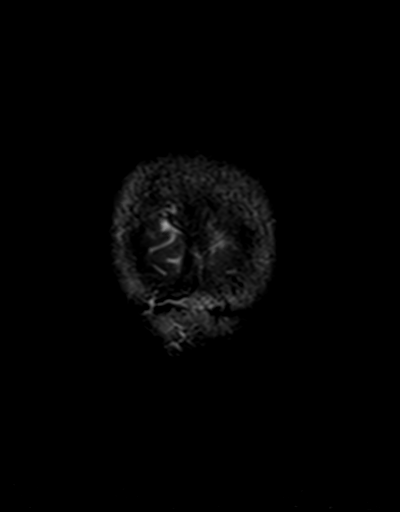
[im 28/28]
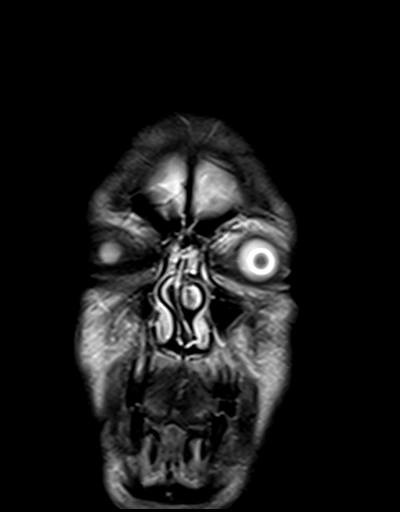

[Series 13: t1_mpr_tra post · axial · 1.0mm · 0.75mm/px · z∈[-44,+98]mm · 10 of 144 slices shown]
[im 1/144]
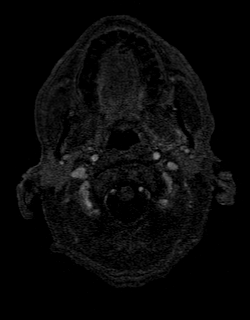
[im 16/144]
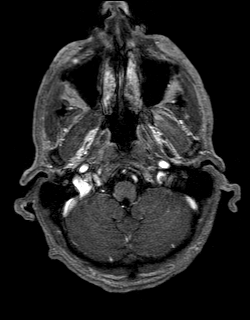
[im 32/144]
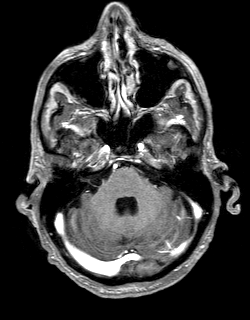
[im 48/144]
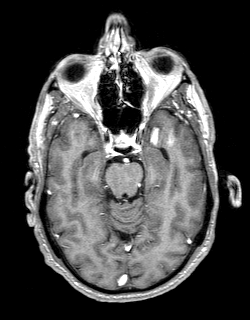
[im 64/144]
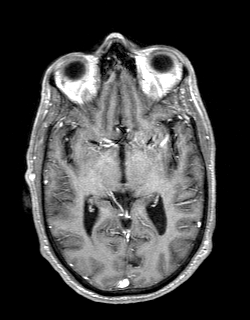
[im 80/144]
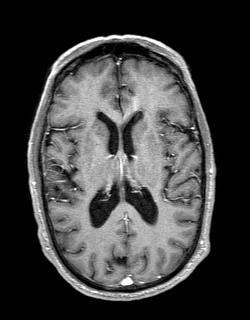
[im 96/144]
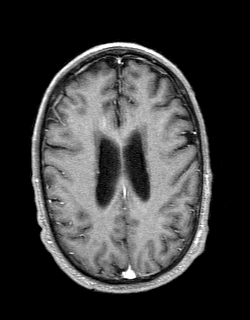
[im 112/144]
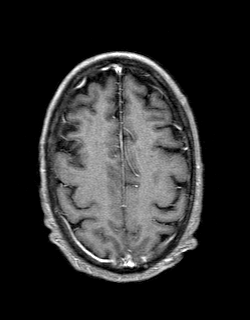
[im 128/144]
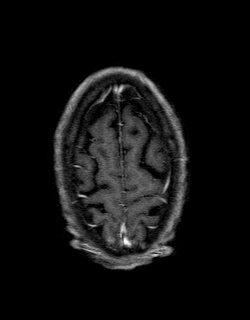
[im 144/144]
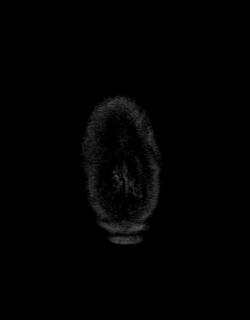

[Series 14: post cor · coronal · 5.0mm · 0.45mm/px · 2 of 28 slices shown]
[im 1/28]
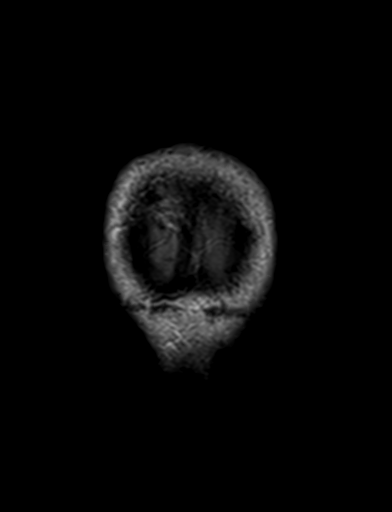
[im 28/28]
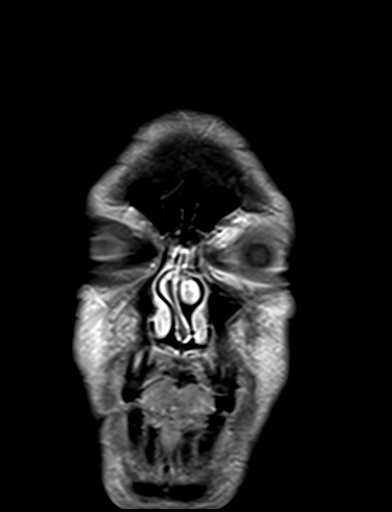

[48 of 48 positions shown; findings below may reference images not displayed]

FINDINGS: Brain: No acute infarction, hemorrhage, hydrocephalus, extra-axial
collection or mass lesion.

Scattered foci of T2 hyperintensity are seen within the white matter
of the cerebral hemispheres and within the pons, nonspecific.
Incidental left cerebellar hemisphere DVA. No focus of abnormal
contrast enhancement.

Vascular: Normal flow voids.

Skull and upper cervical spine: Normal marrow signal.

Sinuses/Orbits: Mucosal thickening of the ethmoid cells and left
maxillary sinus. The orbits are maintained.

Other: Minimal left mastoid effusion.
IMPRESSION: Small amount of T2 hyperintense lesions of the white matter,
nonspecific, may be related to chronic microvascular ischemic
changes.

## 2020-09-14 MED ORDER — GADOBENATE DIMEGLUMINE 529 MG/ML IV SOLN
15.0000 mL | Freq: Once | INTRAVENOUS | Status: AC | PRN
  Administered 2020-09-14: 15 mL via INTRAVENOUS

## 2020-09-14 NOTE — Telephone Encounter (Signed)
Called patient and left a message for a call back.  

## 2020-09-14 NOTE — Telephone Encounter (Signed)
-----   Message from Alda Berthold, DO sent at 09/14/2020  3:55 PM EST ----- Please inform pt that his MRI brain show age-appropriate findings, nothing which would explain his leg weakness or pain.  The next step is NCS/EMG of the legs.  Please order, if pt agreeable. Thanks.

## 2020-09-15 ENCOUNTER — Telehealth: Payer: Self-pay | Admitting: Family Medicine

## 2020-09-15 DIAGNOSIS — E782 Mixed hyperlipidemia: Secondary | ICD-10-CM

## 2020-09-15 NOTE — Telephone Encounter (Signed)
Called patient and left a message for a call back.  

## 2020-09-15 NOTE — Telephone Encounter (Signed)
Pt called in wanted to know about his results from his labs

## 2020-09-16 ENCOUNTER — Telehealth: Payer: Self-pay

## 2020-09-16 MED ORDER — ATORVASTATIN CALCIUM 10 MG PO TABS: 10.0000 mg | ORAL_TABLET | Freq: Every day | ORAL | 3 refills | Status: DC

## 2020-09-16 NOTE — Telephone Encounter (Signed)
Spoke to pt's son, Gwyndolyn Saxon [facility]), and relayed lab results, per Dr. Einar Pheasant. Son says he would like a cholesterol medication sent in. He states that pt is having hernia surgery on 09/26/20 so he will start the new rx after that.

## 2020-09-16 NOTE — Telephone Encounter (Signed)
-----   Message from Donika K Patel, DO sent at 09/14/2020  3:55 PM EST ----- Please inform pt that his MRI brain show age-appropriate findings, nothing which would explain his leg weakness or pain.  The next step is NCS/EMG of the legs.  Please order, if pt agreeable. Thanks.  

## 2020-09-16 NOTE — Telephone Encounter (Signed)
Medication sent to pharmacy  

## 2020-09-16 NOTE — Telephone Encounter (Signed)
Called patient and informed him of MRI results and Dr. Serita Grit recommendations. Patient stated that he is having hernia surgery soon and would like to hold off on the EMG until after his surgery. Patient stated that he will give our office a call when he is ready to proceed with the EMG.

## 2020-09-20 NOTE — Telephone Encounter (Signed)
na

## 2020-09-22 DIAGNOSIS — Z01812 Encounter for preprocedural laboratory examination: Secondary | ICD-10-CM | POA: Diagnosis not present

## 2020-09-22 DIAGNOSIS — K4020 Bilateral inguinal hernia, without obstruction or gangrene, not specified as recurrent: Secondary | ICD-10-CM | POA: Diagnosis not present

## 2020-09-26 DIAGNOSIS — K4020 Bilateral inguinal hernia, without obstruction or gangrene, not specified as recurrent: Secondary | ICD-10-CM | POA: Diagnosis not present

## 2020-09-26 DIAGNOSIS — E1165 Type 2 diabetes mellitus with hyperglycemia: Secondary | ICD-10-CM | POA: Diagnosis not present

## 2020-09-26 DIAGNOSIS — F17210 Nicotine dependence, cigarettes, uncomplicated: Secondary | ICD-10-CM | POA: Diagnosis not present

## 2020-09-30 ENCOUNTER — Encounter: Payer: Self-pay | Admitting: Family Medicine

## 2020-10-06 ENCOUNTER — Telehealth: Payer: Self-pay

## 2020-10-06 NOTE — Chronic Care Management (AMB) (Addendum)
Chronic Care Management Pharmacy Assistant   Name: Sequoyah Counterman  MRN: [id number] DOB: 1956/03/20  Reason for Encounter: Initial questions for CCM visit scheduled 10/11/20  PCP : Lesleigh Noe, MD  Allergies:   Allergies  Allergen Reactions   Azithromycin Other (See Comments)   Codeine Nausea And Vomiting   Erythromycin Other (See Comments)    Gall bladder problem   Glipizide Other (See Comments)   Metformin Hcl Er Other (See Comments)   Onion Other (See Comments)   Valsartan Other (See Comments)    Medications: Outpatient Encounter Medications as of 10/06/2020  Medication Sig Note   atorvastatin (LIPITOR) 10 MG tablet Take 1 tablet (10 mg total) by mouth daily.    diphenoxylate-atropine (LOMOTIL) 2.5-0.025 MG tablet Take 1 tablet by mouth 4 (four) times daily.    gabapentin (NEURONTIN) 100 MG capsule Take 1 tablet four times daily, along with 300mg  tablet = 400mg  four times daily.    gabapentin (NEURONTIN) 300 MG capsule Take 1 capsule (300 mg total) by mouth QID. Take 600 mg at 7 am, 600 mg at 11 am, 600 mg at 3 pm, and 900 mg at bedtime.    glimepiride (AMARYL) 2 MG tablet Take 2 mg by mouth every morning. (Patient not taking: No sig reported) 09/08/2020: Pt states that this causes diarrhea   hyoscyamine (ANASPAZ) 0.125 MG TBDP disintergrating tablet Take by mouth as needed.    ibuprofen (ADVIL) 600 MG tablet Take 1 tablet (600 mg total) by mouth every 6 (six) hours as needed.    nystatin ointment (MYCOSTATIN) Apply topically as needed.    No facility-administered encounter medications on file as of 10/06/2020.    Current Diagnosis: Patient Active Problem List   Diagnosis Date Noted   Atrial bigeminy 09/08/2020   Lymphocytic colitis 09/08/2020   Essential hypertension 09/08/2020   Neuropathy 09/08/2020   Cataract 09/08/2020   Myopathy 09/08/2020   Tobacco abuse 09/08/2020   Aortic atherosclerosis ([place]) 09/08/2020   Bilateral inguinal hernia, without obstruction  or gangrene, not specified as recurrent 06/27/2020   Type 2 diabetes mellitus with other specified complication ([place]) 65/46/5035     Have you seen any other providers since your last visit with PCP? Yes  09/26/20- Procedure- Bilater inguinal hernia repair 09/13/20- Dr. Demetrius Revel- General surgery  Any changes in your medications or health? No  Any side effects from any medications? No  Do you have an symptoms or problems not managed by your medications? No  Any concerns about your health right now? Yes- after surgery has not got appetitive or strength back  Has your provider asked that you check blood pressure, blood sugar, or follow special diet at home? Yes- checks blood pressure.   Do you get any type of exercise on a regular basis? No  Can you think of a goal you would like to reach for your health? No  Do you have any problems getting your medications? No Patient's preferred pharmacy is: [facility]    Is there anything that you would like to discuss during the appointment? No   Javed Cotto was reminded to have all medications, supplements and any blood glucose and blood pressure readings available for review with Debbora Dus, Pharm. D, at his telephone visit on 10/11/20 at 2:00 pm .   Preferred number listed is patients son, Gwyndolyn Saxon. He asked that you call patient on the day of appointment at [phone].  Follow-Up:  Pharmacist Review  Debbora Dus, CPP notified  Margaretmary Dys, [place] [phone]   I have reviewed the care management and care coordination activities outlined in this encounter and I am certifying that I agree with the content of this note. No further action required.  Debbora Dus, PharmD Clinical Pharmacist [facility] at [facility] [phone]

## 2020-10-11 ENCOUNTER — Telehealth: Payer: Self-pay

## 2020-10-11 ENCOUNTER — Ambulatory Visit (INDEPENDENT_AMBULATORY_CARE_PROVIDER_SITE_OTHER): Payer: Medicare Other

## 2020-10-11 DIAGNOSIS — I10 Essential (primary) hypertension: Secondary | ICD-10-CM | POA: Diagnosis not present

## 2020-10-11 DIAGNOSIS — E1169 Type 2 diabetes mellitus with other specified complication: Secondary | ICD-10-CM | POA: Diagnosis not present

## 2020-10-11 MED ORDER — DAPAGLIFLOZIN PROPANEDIOL 5 MG PO TABS: 5.0000 mg | ORAL_TABLET | Freq: Every day | ORAL | 1 refills | Status: DC

## 2020-10-11 NOTE — Progress Notes (Signed)
Chronic Care Management Pharmacy Note  10/12/2020 Name:  Kyle Ramsey MRN:  [id number] DOB:  [birth year]  Subjective: Kyle Ramsey is an 65 y.o. year old male who is a primary patient of Cody, Jobe Marker, MD.  The CCM team was consulted for assistance with disease management and care coordination needs.    Engaged with patient by telephone for initial visit in response to provider referral for pharmacy case management and/or care coordination services.   Consent to Services:  The patient was given the following information about Chronic Care Management services today, agreed to services, and gave verbal consent: 1. CCM service includes personalized support from designated clinical staff supervised by the primary care provider, including individualized plan of care and coordination with other care providers 2. 24/7 contact phone numbers for assistance for urgent and routine care needs. 3. Service will only be billed when office clinical staff spend 20 minutes or more in a month to coordinate care. 4. Only one practitioner may furnish and bill the service in a calendar month. 5.The patient may stop CCM services at any time (effective at the end of the month) by phone call to the office staff. 6. The patient will be responsible for cost sharing (co-pay) of up to 20% of the service fee (after annual deductible is met). Patient agreed to services and consent obtained.  Patient Care Team: Lesleigh Noe, MD as PCP - General (Family Medicine) Debbora Dus, [facility] as Pharmacist (Pharmacist)  CCM Consent 09/09/20  Recent office visits: 09/08/20 - DM, Checking sugar at home 150-180; Sugar 1 hour after 250-300. Failed metformin, glipizide, and glimepiride. Refer to CCM for cost selection/samples. HTN, not willing to try medication. Chronic diarrhea, on Lomotil, GI support. Lipid panel today, discuss statin at follow up. Neuropathy, on gabapentin, helping. Tobacco use, encouraged cutting back, candidate  for CT screening. Myopathy followed by neurology, DDD, repeat MRI pending.   Recent consult visits: 09/26/20 - Hernia surgery  06/30/20 - Neurology   Hospital visits: None in previous 6 months  Objective:  Lab Results  Component Value Date   CREATININE 0.90 09/08/2020   BUN 14 09/08/2020   GFR 89.52 09/08/2020   NA 143 09/08/2020   K 3.1 (L) 09/08/2020   CALCIUM 9.3 09/08/2020   CO2 39 (H) 09/08/2020    Lab Results  Component Value Date/Time   HGBA1C 7.3 (A) 09/08/2020 11:58 AM   GFR 89.52 09/08/2020 12:22 PM    Last diabetic Eye exam: No results found for: HMDIABEYEEXA  Last diabetic Foot exam: No results found for: HMDIABFOOTEX   Lab Results  Component Value Date   CHOL 171 09/08/2020   HDL 45.50 09/08/2020   LDLCALC 111 (H) 09/08/2020   TRIG 73.0 09/08/2020   CHOLHDL 4 09/08/2020   Hepatic Function Latest Ref Rng & Units 09/08/2020 11/15/2011  Total Protein 6.0 - 8.3 g/dL 6.0 6.7  Albumin 3.5 - 5.2 g/dL 4.0 4.4  AST 0 - 37 U/L 12 22  ALT 0 - 53 U/L 10 23  Alk Phosphatase 39 - 117 U/L 47 80  Total Bilirubin 0.2 - 1.2 mg/dL 1.2 1.5(H)   No results found for: TSH, FREET4  CBC Latest Ref Rng & Units 09/08/2020 11/15/2011  WBC 4.0 - 10.5 K/uL 8.3 6.9  Hemoglobin 13.0 - 17.0 g/dL 13.5 17.0  Hematocrit 39.0 - 52.0 % 38.2(L) 50.1  Platelets 150.0 - 400.0 K/uL 200.0 -    No results found for: VD25OH  Clinical ASCVD: Yes  The 10-year  ASCVD risk score Mikey Bussing DC Jr., et al., 2013) is: 43.2%   Values used to calculate the score:     Age: 78 years     Sex: Male     Is Non-Hispanic African American: No     Diabetic: Yes     Tobacco smoker: Yes     Systolic Blood Pressure: 295 mmHg     Is BP treated: No     HDL Cholesterol: 45.5 mg/dL     Total Cholesterol: 171 mg/dL    Depression screen PHQ 2/9 09/08/2020  Decreased Interest 0  Down, Depressed, Hopeless 0  PHQ - 2 Score 0    Social History   Tobacco Use  Smoking Status Current Every Day Smoker  . Packs/day:  0.50  . Years: 40.00  . Pack years: 20.00  . Types: Cigarettes  Smokeless Tobacco Never Used  Tobacco Comment   1 ppd 40 years, cut back to 0.5 ppd   BP Readings from Last 3 Encounters:  09/08/20 (!) 156/80  06/30/20 (!) 163/97  06/15/19 (!) 191/99   Pulse Readings from Last 3 Encounters:  09/08/20 81  06/30/20 94  06/15/19 93   Wt Readings from Last 3 Encounters:  09/08/20 131 lb 12 oz (59.8 kg)  06/30/20 133 lb (60.3 kg)  06/14/19 165 lb (74.8 kg)    Assessment/Interventions: Review of patient past medical history, allergies, medications, health status, including review of consultants reports, laboratory and other test data, was performed as part of comprehensive evaluation and provision of chronic care management services.   SDOH:  (Social Determinants of Health) assessments and interventions performed: Yes SDOH Interventions   Flowsheet Row Most Recent Value  SDOH Interventions   Financial Strain Interventions --  [Apply for [place]  Allergies  Allergen Reactions  . Azithromycin Other (See Comments)  . Codeine Nausea And Vomiting  . Erythromycin Other (See Comments)    Gall bladder problem  . Glipizide Other (See Comments)  . Metformin Hcl Er Other (See Comments)  . Onion Other (See Comments)  . Valsartan Other (See Comments)    Medications Reviewed Today    Reviewed by Debbora Dus, [facility] (Pharmacist) on 10/12/20 at 1604  Med List Status: <None>  Medication Order Taking? Sig Documenting Provider Last Dose Status Informant  dapagliflozin propanediol (FARXIGA) 5 MG TABS tablet [id number]  Take 1 tablet (5 mg total) by mouth daily before breakfast. Lesleigh Noe, MD  Active   diphenoxylate-atropine (LOMOTIL) 2.5-0.025 MG tablet [id number] No Take 1 tablet by mouth 4 (four) times daily.  Patient not taking: Reported on 10/11/2020   [provider] Not Taking Active   gabapentin (NEURONTIN) 100 MG capsule [id number] No Take  1 tablet four times daily, along with 352m tablet = 4051mfour times daily.  Patient not taking: Reported on 10/11/2020   PaAlda BertholdDO Not Taking Active   gabapentin (NEURONTIN) 300 MG capsule [id number] Take 1 capsule (300 mg total) by mouth QID. Take 600 mg at 7 am, 600 mg at 11 am, 600 mg at 3 pm, and 900 mg at bedtime.  Patient not taking: Reported on 10/11/2020   PaAlda BertholdDO Not Taking Active   hyoscyamine (ANASPAZ) 0.125 MG TBDP disintergrating tablet [id number] Take by mouth as needed. [provider] Taking Active   ibuprofen (ADVIL) 600 MG tablet [id number] Take 1 tablet (600 mg total) by mouth every 6 (six) hours as needed.  Chase Picket, MD Taking Active   nystatin ointment (MYCOSTATIN) [id number] Yes Apply topically as needed. [provider] Taking Active           Patient Active Problem List   Diagnosis Date Noted  . Atrial bigeminy 09/08/2020  . Lymphocytic colitis 09/08/2020  . Essential hypertension 09/08/2020  . Neuropathy 09/08/2020  . Cataract 09/08/2020  . Myopathy 09/08/2020  . Tobacco abuse 09/08/2020  . Aortic atherosclerosis ([place]) 09/08/2020  . Bilateral inguinal hernia, without obstruction or gangrene, not specified as recurrent 06/27/2020  . Type 2 diabetes mellitus with other specified complication ([place]) 38/05/1750    Immunization History  Administered Date(s) Administered  . PFIZER(Purple Top)SARS-COV-2 Vaccination 11/19/2019, 12/26/2019    Conditions to be addressed/monitored:  Hypertension, Hyperlipidemia, Diabetes and Tobacco use  Care Plan : [place]  Updates made by Debbora Dus, [place] since 10/12/2020 12:00 AM    Problem: CHL AMB "PATIENT-SPECIFIC PROBLEM"     Long-Range Goal: Disease Management   Start Date: 10/12/2020  Priority: High  Note:   Current Barriers:  . Unable to achieve control of blood pressure and diabetes   . Multiple intolerances to diabetic medications  Pharmacist  Clinical Goal(s):  Marland Kitchen Over the next 30 days, patient will adhere to plan to optimize therapeutic regimen for diabetes as evidenced by report of adherence to recommended medication management changes through collaboration with PharmD and provider.   Interventions: . 1:1 collaboration with Lesleigh Noe, MD regarding development and update of comprehensive plan of care as evidenced by provider attestation and co-signature . Inter-disciplinary care team collaboration (see longitudinal plan of care) . Comprehensive medication review performed; medication list updated in electronic medical record  Hypertension (BP goal <140/90) -Uncontrolled -Current treatment: . No pharmacotherapy -Medications previously tried: none -Current home readings: thinks he has blood pressure monitor, not monitoring regularly -Patient prefers to discuss one medical condition at a time - focused on diabetes.  -Recommended to discuss at follow up  Hyperlipidemia: (LDL goal < 70) -Uncontrolled -Current treatment: . Atorvastatin 10 mg - 1 tablet daily (not taking) -Medications previously tried: none  -Pt reports he never started atorvastatin and has no interest in starting a statin due to side effects. Reports his cholesterol has always been fine and was only elevated at last reading. Reports cholesterol medication resulted in friend having a stroke. -Current dietary patterns: reports low carb diet  -Current exercise habits: denies formal exercise -Recommended lifestyle management  Diabetes (A1c goal <7.5%) -Controlled -Current medications: . Glimepiride 2 mg - 1 tablet daily (not taking) -Medications previously tried: metformin - GI, glipizide, glimepiride - hypoglycemia, Januvia - cost  -Current home glucose readings - checking once daily  . fasting glucose: 150  . post prandial glucose: 250-280 (varies)  -Reports hyperglycemic symptoms -Pt reports he has lost some weight and eating "air and cardboard". Wants  to start a medication that does not cause diarrhea and can allow him to eat a little bit more. -Educated on SGLT-2 side effects, cost, and benefits -Foot and eye exam due.  -Recommended Farxiga 5 mg daily - use coupon and apply for PAP. Would like delivered through [facility]. He will come into on 3/2 10 AM for PAP.  CMA follow up in 2 weeks to review tolerance and PAP.  Misc Med Review:   Gabapentin 300 mg - not taking, pain has improved since hernia surgery   Gabapentin 100 mg - not taking, pain has improved since hernia surgery  Lomotil PRN -  has not needed recently  Hyoscyamine 0.125 mg TBDP - has not needed recently  Advil 600 mg - uses 3 tablets (600 mg) once daily for arthritis in his back  Nystatin - uses PRN  Patient Goals/Self-Care Activities . Over the next 30 days, patient will:  - take medications as prescribed - call if any side effects with Farxiga  Follow Up Plan: The care management team will reach out to the patient again over the next 14 days.   Medication Assistance: Application for Farxiga  medication assistance program. in process.  Anticipated assistance start date 11/11/20.  See plan of care for additional detail.     Patient's preferred pharmacy is:  [id number] [place], [place] [place]Phone: [phone]: [phone] [id number] [place], [place]: [phone]: [phone] [place] [address] [place]: [phone]: [phone] pill box? No - not taking any routine medications  Patient agrees to have [facility] by [facility] in order to use a coupon. [facility] preferred [facility] is [facility].  Care Plan and Follow Up Patient Decision:  Patient agrees  to Care Plan and Follow-up.  MiDebbora DusPharmD Clinical Pharmacist [phone]

## 2020-10-11 NOTE — Telephone Encounter (Signed)
[facility] 

## 2020-10-11 NOTE — Telephone Encounter (Signed)
[facility] Farxiga 5 mg medication delivery for 10/12/20.

## 2020-10-11 NOTE — Telephone Encounter (Signed)
Patient agreeable to start Farxiga 5 mg daily. Please send to [facility] if approved. We will use a coupon for 30 day supply and deliver. Pt plans to come in to office to complete patient assistance application tomorrow.  Debbora Dus, PharmD Clinical Pharmacist [facility] at [facility] [phone]

## 2020-10-12 ENCOUNTER — Other Ambulatory Visit: Payer: Self-pay

## 2020-10-12 NOTE — Patient Instructions (Signed)
Dear Kyle Ramsey,  It was a pleasure meeting you during our initial appointment on October 11, 2020. Below is a summary of the goals we discussed and components of chronic care management. Please contact me anytime with questions or concerns.   Visit Information  Patient Care Plan: CCM Pharmacy Care Plan    Problem Identified: CHL AMB "PATIENT-SPECIFIC PROBLEM"     Long-Range Goal: Disease Management   Start Date: 10/12/2020  Priority: High  Note:   Current Barriers:  . Unable to achieve control of blood pressure and diabetes   . Multiple intolerances to diabetic medications  Pharmacist Clinical Goal(s):  Marland Kitchen Over the next 30 days, patient will adhere to plan to optimize therapeutic regimen for diabetes as evidenced by report of adherence to recommended medication management changes through collaboration with PharmD and provider.   Interventions: . 1:1 collaboration with Lesleigh Noe, MD regarding development and update of comprehensive plan of care as evidenced by provider attestation and co-signature . Inter-disciplinary care team collaboration (see longitudinal plan of care) . Comprehensive medication review performed; medication list updated in electronic medical record  Hypertension (BP goal <140/90) -Uncontrolled -Current treatment: . No pharmacotherapy -Medications previously tried: none -Current home readings: thinks he has blood pressure monitor, not monitoring regularly -Patient prefers to discuss one medical condition at a time - focused on diabetes.  -Recommended to discuss at follow up  Hyperlipidemia: (LDL goal < 70) -Uncontrolled -Current treatment: . Atorvastatin 10 mg - 1 tablet daily (not taking) -Medications previously tried: none  -Pt reports he never started atorvastatin and has no interest in starting a statin due to side effects. Reports his cholesterol has always been fine and was only elevated at last reading. Reports cholesterol medication resulted in  friend having a stroke. -Current dietary patterns: reports low carb diet  -Current exercise habits: denies formal exercise -Recommended lifestyle management  Diabetes (A1c goal <7.5%) -Controlled -Current medications: . Glimepiride 2 mg - 1 tablet daily (not taking) -Medications previously tried: metformin - GI, glipizide, glimepiride - hypoglycemia, Januvia - cost  -Current home glucose readings - checking once daily  . fasting glucose: 150  . post prandial glucose: 250-280 (varies)  -Reports hyperglycemic symptoms -Pt reports he has lost some weight and eating "air and cardboard". Wants to start a medication that does not cause diarrhea and can allow him to eat a little bit more. -Educated on SGLT-2 side effects, cost, and benefits -Foot and eye exam due.  -Recommended Farxiga 5 mg daily - use coupon and apply for PAP. Would like delivered through [facility]. He will come into on 3/2 10 AM for PAP.  CMA follow up in 2 weeks to review tolerance and PAP.  Misc Med Review:   Gabapentin 300 mg - not taking, pain has improved since hernia surgery   Gabapentin 100 mg - not taking, pain has improved since hernia surgery  Lomotil PRN - has not needed recently  Hyoscyamine 0.125 mg TBDP - has not needed recently  Advil 600 mg - uses 3 tablets (600 mg) once daily for arthritis in his back  Nystatin - uses PRN  Patient Goals/Self-Care Activities . Over the next 30 days, patient will:  - take medications as prescribed - call if any side effects with Farxiga  Follow Up Plan: The care management team will reach out to the patient again over the next 14 days.   Medication Assistance: Application for Farxiga  medication assistance program. in process.  Anticipated assistance start date 11/11/20.  See plan of care for additional detail.     Mr. Kyle Ramsey was given information about Chronic Care Management services today including:  1. CCM service includes personalized support from designated  clinical staff supervised by his physician, including individualized plan of care and coordination with other care providers 2. 24/7 contact phone numbers for assistance for urgent and routine care needs. 3. Standard insurance, coinsurance, copays and deductibles apply for chronic care management only during months in which we provide at least 20 minutes of these services. Most insurances cover these services at 100%, however patients may be responsible for any copay, coinsurance and/or deductible if applicable. This service may help you avoid the need for more expensive face-to-face services. 4. Only one practitioner may furnish and bill the service in a calendar month. 5. The patient may stop CCM services at any time (effective at the end of the month) by phone call to the office staff.  Patient agreed to services and verbal consent obtained.   The patient verbalized understanding of instructions, educational materials, and care plan provided today and agreed to receive a mailed copy of patient instructions, educational materials, and care plan.  The pharmacy team will reach out to the patient again over the next 14 days.   Debbora Dus, PharmD Clinical Pharmacist [facility] [phone]   Diabetes Mellitus and Nutrition, Adult When you have diabetes, or diabetes mellitus, it is very important to have healthy eating habits because your blood sugar (glucose) levels are greatly affected by what you eat and drink. Eating healthy foods in the right amounts, at about the same times every day, can help you:  Control your blood glucose.  Lower your risk of heart disease.  Improve your blood pressure.  Reach or maintain a healthy weight. What can affect my meal plan? Every person with diabetes is different, and each person has different needs for a meal plan. Your health care provider may recommend that you work with a dietitian to make a meal plan that is best for  you. Your meal plan may vary depending on factors such as:  The calories you need.  The medicines you take.  Your weight.  Your blood glucose, blood pressure, and cholesterol levels.  Your activity level.  Other health conditions you have, such as heart or kidney disease. How do carbohydrates affect me? Carbohydrates, also called carbs, affect your blood glucose level more than any other type of food. Eating carbs naturally raises the amount of glucose in your blood. Carb counting is a method for keeping track of how many carbs you eat. Counting carbs is important to keep your blood glucose at a healthy level, especially if you use insulin or take certain oral diabetes medicines. It is important to know how many carbs you can safely have in each meal. This is different for every person. Your dietitian can help you calculate how many carbs you should have at each meal and for each snack. How does alcohol affect me? Alcohol can cause a sudden decrease in blood glucose (hypoglycemia), especially if you use insulin or take certain oral diabetes medicines. Hypoglycemia can be a life-threatening condition. Symptoms of hypoglycemia, such as sleepiness, dizziness, and confusion, are similar to symptoms of having too much alcohol.  Do not drink alcohol if: ? Your health care provider tells you not to drink. ? You are pregnant, may be pregnant, or are planning to become pregnant.  If you drink alcohol: ? Do not drink on  an empty stomach. ? Limit how much you use to:  0-1 drink a day for women.  0-2 drinks a day for men. ? Be aware of how much alcohol is in your drink. In the [place] one drink equals one 12 oz bottle of beer (355 mL), one 5 oz glass of wine (148 mL), or one 1 oz glass of hard liquor (44 mL). ? Keep yourself hydrated with water, diet soda, or unsweetened iced tea.  Keep in mind that regular soda, juice, and other mixers may contain a lot of sugar and must be counted as  carbs. What are tips for following this plan? Reading food labels  Start by checking the serving size on the "Nutrition Facts" label of packaged foods and drinks. The amount of calories, carbs, fats, and other nutrients listed on the label is based on one serving of the item. Many items contain more than one serving per package.  Check the total grams (g) of carbs in one serving. You can calculate the number of servings of carbs in one serving by dividing the total carbs by 15. For example, if a food has 30 g of total carbs per serving, it would be equal to 2 servings of carbs.  Check the number of grams (g) of saturated fats and trans fats in one serving. Choose foods that have a low amount or none of these fats.  Check the number of milligrams (mg) of salt (sodium) in one serving. Most people should limit total sodium intake to less than 2,300 mg per day.  Always check the nutrition information of foods labeled as "low-fat" or "nonfat." These foods may be higher in added sugar or refined carbs and should be avoided.  Talk to your dietitian to identify your daily goals for nutrients listed on the label. Shopping  Avoid buying canned, pre-made, or processed foods. These foods tend to be high in fat, sodium, and added sugar.  Shop around the outside edge of the grocery store. This is where you will most often find fresh fruits and vegetables, bulk grains, fresh meats, and fresh dairy. Cooking  Use low-heat cooking methods, such as baking, instead of high-heat cooking methods like deep frying.  Cook using healthy oils, such as olive, canola, or sunflower oil.  Avoid cooking with butter, cream, or high-fat meats. Meal planning  Eat meals and snacks regularly, preferably at the same times every day. Avoid going long periods of time without eating.  Eat foods that are high in fiber, such as fresh fruits, vegetables, beans, and whole grains. Talk with your dietitian about how many servings  of carbs you can eat at each meal.  Eat 4-6 oz (112-168 g) of lean protein each day, such as lean meat, chicken, fish, eggs, or tofu. One ounce (oz) of lean protein is equal to: ? 1 oz (28 g) of meat, chicken, or fish. ? 1 egg. ?  cup (62 g) of tofu.  Eat some foods each day that contain healthy fats, such as avocado, nuts, seeds, and fish.   What foods should I eat? Fruits Berries. Apples. Oranges. Peaches. Apricots. Plums. Grapes. Mango. Papaya. Pomegranate. Kiwi. Cherries. Vegetables Lettuce. Spinach. Leafy greens, including kale, chard, collard greens, and mustard greens. Beets. Cauliflower. Cabbage. Broccoli. Carrots. Green beans. Tomatoes. Peppers. Onions. Cucumbers. Brussels sprouts. Grains Whole grains, such as whole-wheat or whole-grain bread, crackers, tortillas, cereal, and pasta. Unsweetened oatmeal. Quinoa. Brown or wild rice. Meats and other proteins Seafood. Poultry without skin. Lean cuts of  poultry and beef. Tofu. Nuts. Seeds. Dairy Low-fat or fat-free dairy products such as milk, yogurt, and cheese. The items listed above may not be a complete list of foods and beverages you can eat. Contact a dietitian for more information. What foods should I avoid? Fruits Fruits canned with syrup. Vegetables Canned vegetables. Frozen vegetables with butter or cream sauce. Grains Refined white flour and flour products such as bread, pasta, snack foods, and cereals. Avoid all processed foods. Meats and other proteins Fatty cuts of meat. Poultry with skin. Breaded or fried meats. Processed meat. Avoid saturated fats. Dairy Full-fat yogurt, cheese, or milk. Beverages Sweetened drinks, such as soda or iced tea. The items listed above may not be a complete list of foods and beverages you should avoid. Contact a dietitian for more information. Questions to ask a health care provider  Do I need to meet with a diabetes educator?  Do I need to meet with a dietitian?  What number  can I call if I have questions?  When are the best times to check my blood glucose? Where to find more information:  American Diabetes Association: diabetes.org  Academy of Nutrition and Dietetics: www.eatright.[employer] of Diabetes and Digestive and Kidney Diseases: [employer]  Association of Diabetes Care and Education Specialists: www.diabeteseducator.org Summary  It is important to have healthy eating habits because your blood sugar (glucose) levels are greatly affected by what you eat and drink.  A healthy meal plan will help you control your blood glucose and maintain a healthy lifestyle.  Your health care provider may recommend that you work with a dietitian to make a meal plan that is best for you.  Keep in mind that carbohydrates (carbs) and alcohol have immediate effects on your blood glucose levels. It is important to count carbs and to use alcohol carefully. This information is not intended to replace advice given to you by your health care provider. Make sure you discuss any questions you have with your health care provider. Document Revised: 07/07/2019 Document Reviewed: 07/07/2019 Elsevier Patient Education  2021 [employer].

## 2020-10-12 NOTE — Telephone Encounter (Signed)
Requested Ria Comment fill out patient assistance form for Farxiga 5 mg daily and send to front desk. Patient planned to come in on 10/12/20 around 10 AM to complete form.

## 2020-10-13 NOTE — Progress Notes (Signed)
I have personally reviewed this encounter including the documentation in this note and have collaborated with the care management provider regarding care management and care coordination activities to include development and update of the comprehensive care plan. I am certifying that I agree with the content of this note and encounter as supervising physician.    

## 2020-10-14 ENCOUNTER — Other Ambulatory Visit: Payer: Self-pay | Admitting: Neurology

## 2020-10-20 NOTE — Chronic Care Management (AMB) (Signed)
    Chronic Care Management Pharmacy Assistant   Name: Trafton Roker  MRN: [id number] DOB: Apr 24, 1956   Reason for Encounter: Patient Assistance - Farxiga     Medications: Outpatient Encounter Medications as of 10/11/2020  Medication Sig  . dapagliflozin propanediol (FARXIGA) 5 MG TABS tablet Take 1 tablet (5 mg total) by mouth daily before breakfast.  . diphenoxylate-atropine (LOMOTIL) 2.5-0.025 MG tablet Take 1 tablet by mouth 4 (four) times daily. (Patient not taking: Reported on 10/11/2020)  . gabapentin (NEURONTIN) 100 MG capsule Take 1 tablet four times daily, along with 300mg  tablet = 400mg  four times daily. (Patient not taking: Reported on 10/11/2020)  . hyoscyamine (ANASPAZ) 0.125 MG TBDP disintergrating tablet Take by mouth as needed.  Marland Kitchen ibuprofen (ADVIL) 600 MG tablet Take 1 tablet (600 mg total) by mouth every 6 (six) hours as needed.  . nystatin ointment (MYCOSTATIN) Apply topically as needed.  . [DISCONTINUED] gabapentin (NEURONTIN) 300 MG capsule Take 1 capsule (300 mg total) by mouth QID. Take 600 mg at 7 am, 600 mg at 11 am, 600 mg at 3 pm, and 900 mg at bedtime. (Patient not taking: Reported on 10/11/2020)   No facility-administered encounter medications on file as of 10/11/2020.   Contacted [employer]  to follow up on patient assistance application for [place]. Per representative at [employer] they have not received any portion of the application. Representative created a patient ID number, [id number].    Follow-Up:  Patient Assistance Coordination and Pharmacist Review  Debbora Dus, CPP notified  Margaretmary Dys, [place] [phone]  Total time spent for month: 20

## 2020-10-25 ENCOUNTER — Telehealth: Payer: Self-pay

## 2020-10-25 ENCOUNTER — Emergency Department
Admission: EM | Admit: 2020-10-25 | Discharge: 2020-10-25 | Disposition: A | Discharge status: Home / Self Care | Payer: Medicare Other | Attending: Emergency Medicine | Admitting: Emergency Medicine

## 2020-10-25 ENCOUNTER — Emergency Department: Payer: Medicare Other

## 2020-10-25 ENCOUNTER — Other Ambulatory Visit: Payer: Self-pay

## 2020-10-25 ENCOUNTER — Encounter: Payer: Self-pay | Admitting: Emergency Medicine

## 2020-10-25 DIAGNOSIS — M7989 Other specified soft tissue disorders: Secondary | ICD-10-CM | POA: Diagnosis not present

## 2020-10-25 DIAGNOSIS — Z7984 Long term (current) use of oral hypoglycemic drugs: Secondary | ICD-10-CM | POA: Insufficient documentation

## 2020-10-25 DIAGNOSIS — N5089 Other specified disorders of the male genital organs: Secondary | ICD-10-CM | POA: Insufficient documentation

## 2020-10-25 DIAGNOSIS — R079 Chest pain, unspecified: Secondary | ICD-10-CM | POA: Diagnosis not present

## 2020-10-25 DIAGNOSIS — E1140 Type 2 diabetes mellitus with diabetic neuropathy, unspecified: Secondary | ICD-10-CM | POA: Insufficient documentation

## 2020-10-25 DIAGNOSIS — K8020 Calculus of gallbladder without cholecystitis without obstruction: Secondary | ICD-10-CM | POA: Diagnosis not present

## 2020-10-25 DIAGNOSIS — I509 Heart failure, unspecified: Secondary | ICD-10-CM | POA: Diagnosis not present

## 2020-10-25 DIAGNOSIS — F17210 Nicotine dependence, cigarettes, uncomplicated: Secondary | ICD-10-CM | POA: Diagnosis not present

## 2020-10-25 DIAGNOSIS — R1900 Intra-abdominal and pelvic swelling, mass and lump, unspecified site: Secondary | ICD-10-CM | POA: Diagnosis not present

## 2020-10-25 DIAGNOSIS — E876 Hypokalemia: Secondary | ICD-10-CM

## 2020-10-25 DIAGNOSIS — R0602 Shortness of breath: Secondary | ICD-10-CM | POA: Diagnosis not present

## 2020-10-25 DIAGNOSIS — I110 Hypertensive heart disease with heart failure: Secondary | ICD-10-CM | POA: Insufficient documentation

## 2020-10-25 DIAGNOSIS — N281 Cyst of kidney, acquired: Secondary | ICD-10-CM | POA: Diagnosis not present

## 2020-10-25 DIAGNOSIS — R600 Localized edema: Secondary | ICD-10-CM | POA: Diagnosis present

## 2020-10-25 DIAGNOSIS — J90 Pleural effusion, not elsewhere classified: Secondary | ICD-10-CM | POA: Diagnosis not present

## 2020-10-25 LAB — HEPATIC FUNCTION PANEL
ALT: 14 U/L (ref 0–44)
AST: 14 U/L — ABNORMAL LOW (ref 15–41)
Albumin: 3.3 g/dL — ABNORMAL LOW (ref 3.5–5.0)
Alkaline Phosphatase: 60 U/L (ref 38–126)
Bilirubin, Direct: 0.1 mg/dL (ref 0.0–0.2)
Indirect Bilirubin: 0.8 mg/dL (ref 0.3–0.9)
Total Bilirubin: 0.9 mg/dL (ref 0.3–1.2)
Total Protein: 5.9 g/dL — ABNORMAL LOW (ref 6.5–8.1)

## 2020-10-25 LAB — BASIC METABOLIC PANEL
Anion gap: 7 (ref 5–15)
BUN: 12 mg/dL (ref 8–23)
CO2: 33 mmol/L — ABNORMAL HIGH (ref 22–32)
Calcium: 8.5 mg/dL — ABNORMAL LOW (ref 8.9–10.3)
Chloride: 101 mmol/L (ref 98–111)
Creatinine, Ser: 0.95 mg/dL (ref 0.61–1.24)
GFR, Estimated: >60 mL/min (ref >60)
Glucose, Bld: 212 mg/dL — ABNORMAL HIGH (ref 70–99)
Potassium: 2.9 mmol/L — ABNORMAL LOW (ref 3.5–5.1)
Sodium: 141 mmol/L (ref 135–145)

## 2020-10-25 LAB — URINALYSIS, COMPLETE (UACMP) WITH MICROSCOPIC
Bacteria, UA: NONE SEEN
Bilirubin Urine: NEGATIVE
Glucose, UA: 50 mg/dL — AB
Hgb urine dipstick: NEGATIVE
Ketones, ur: NEGATIVE mg/dL
Leukocytes,Ua: NEGATIVE
Nitrite: NEGATIVE
Protein, ur: NEGATIVE mg/dL
Specific Gravity, Urine: 1.015 (ref 1.005–1.030)
Squamous Epithelial / HPF: NONE SEEN (ref 0–5)
pH: 6 (ref 5.0–8.0)

## 2020-10-25 LAB — BRAIN NATRIURETIC PEPTIDE: B Natriuretic Peptide: 512.3 pg/mL — ABNORMAL HIGH (ref 0.0–100.0)

## 2020-10-25 LAB — CBC
HCT: 31.3 % — ABNORMAL LOW (ref 39.0–52.0)
Hemoglobin: 10.8 g/dL — ABNORMAL LOW (ref 13.0–17.0)
MCH: 33 pg (ref 26.0–34.0)
MCHC: 34.5 g/dL (ref 30.0–36.0)
MCV: 95.7 fL (ref 80.0–100.0)
Platelets: 219 10*3/uL (ref 150–400)
RBC: 3.27 MIL/uL — ABNORMAL LOW (ref 4.22–5.81)
RDW: 12.6 % (ref 11.5–15.5)
WBC: 5.8 10*3/uL (ref 4.0–10.5)
nRBC: 0 % (ref 0.0–0.2)

## 2020-10-25 LAB — MAGNESIUM: Magnesium: 2.1 mg/dL (ref 1.7–2.4)

## 2020-10-25 LAB — TROPONIN I (HIGH SENSITIVITY)
Troponin I (High Sensitivity): 11 ng/L (ref <18)
Troponin I (High Sensitivity): 12 ng/L (ref <18)

## 2020-10-25 IMAGING — US US EXTREM LOW VENOUS
1 series · 14 of 24 positions shown · non-contrast
Comparison: None.

CLINICAL DATA: Bilateral leg and foot swelling.

EXAM:
BILATERAL LOWER EXTREMITY VENOUS DOPPLER ULTRASOUND
TECHNIQUE: Gray-scale sonography with compression, as well as color and duplex
ultrasound, were performed to evaluate the deep venous system(s)
from the level of the common femoral vein through the popliteal and
proximal calf veins.

[Series 1: us extrem low venous · 0.07mm/px · 14 of 67 slices shown]
[im 1/67]
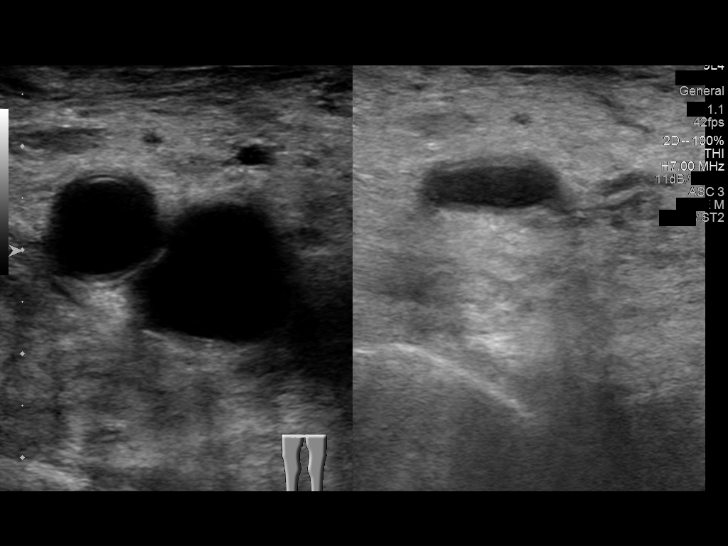
[im 6/67]
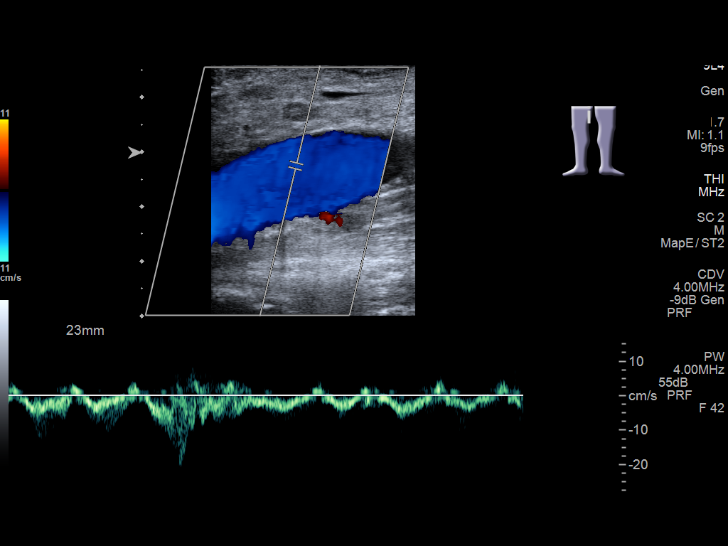
[im 12/67]
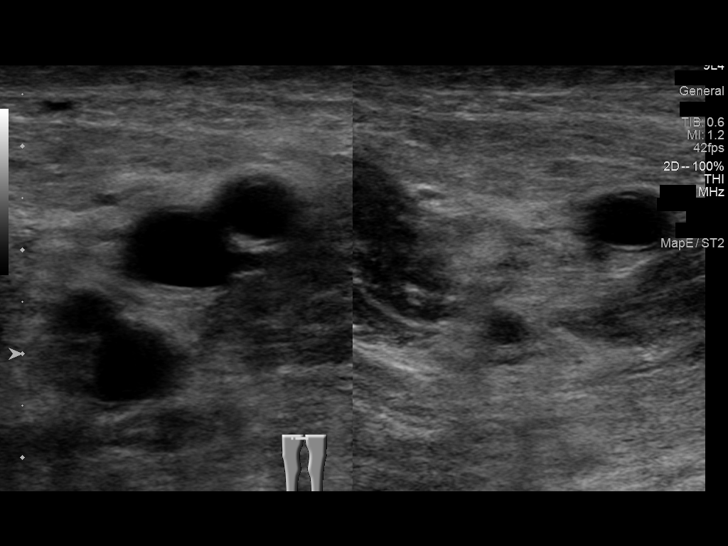
[im 18/67]
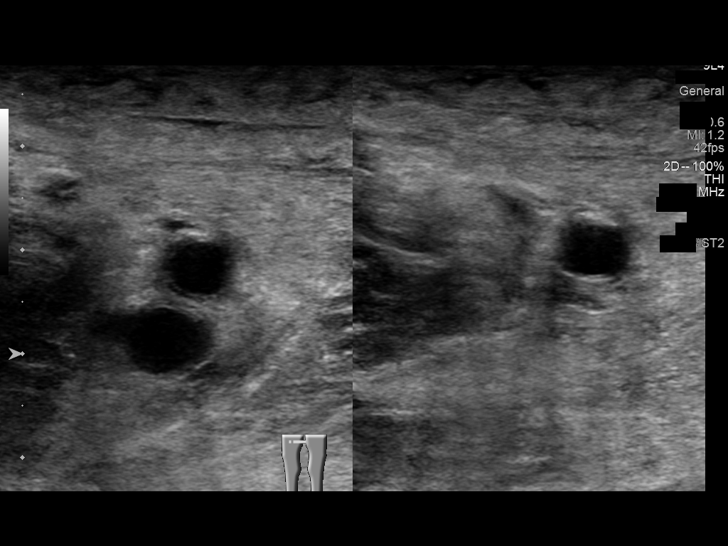
[im 21/67]
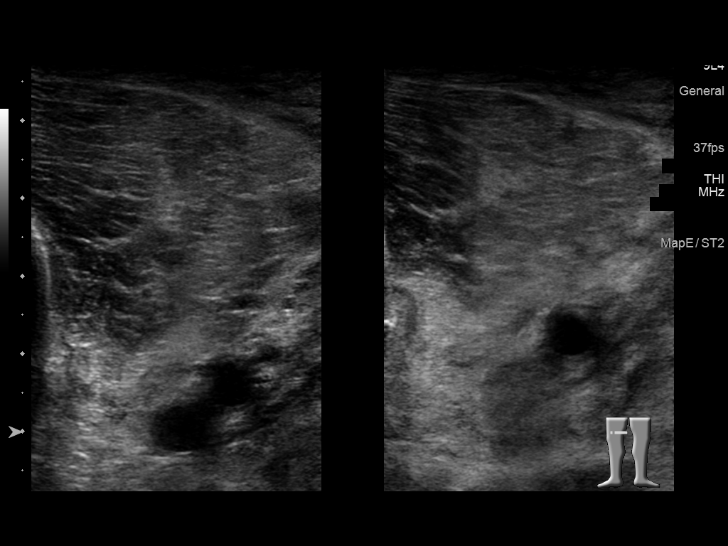
[im 26/67]
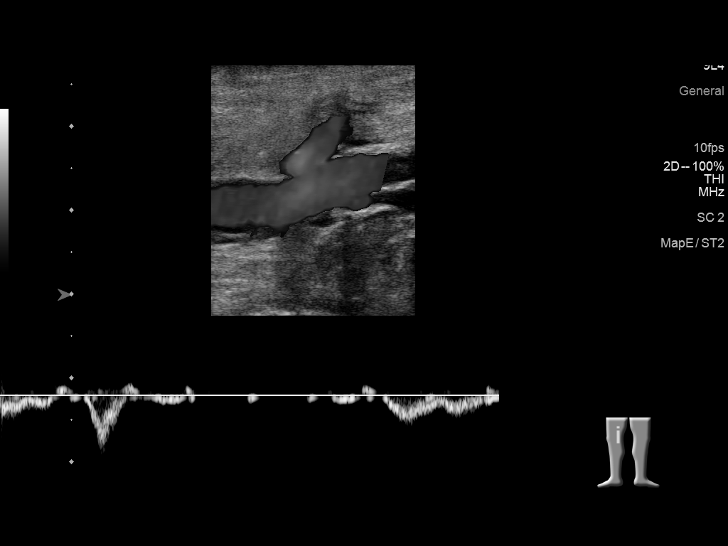
[im 32/67]
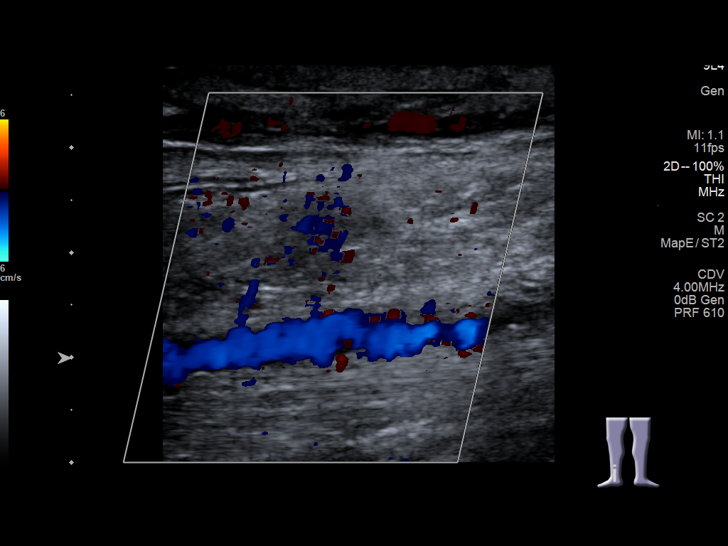
[im 35/67]
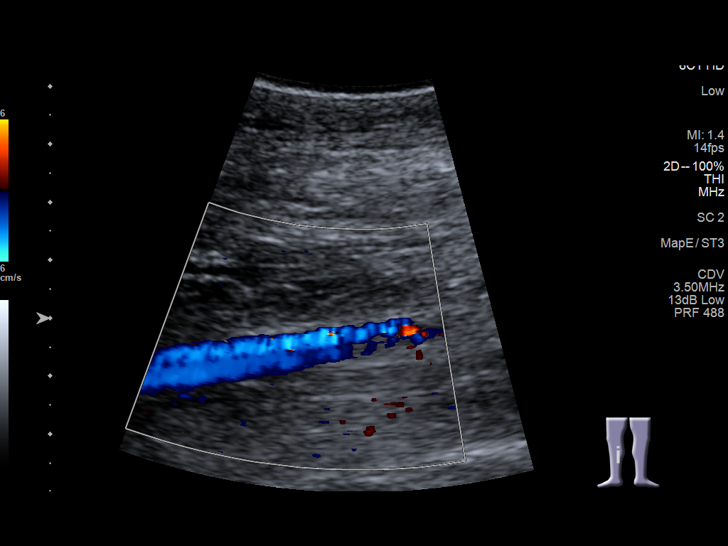
[im 41/67]
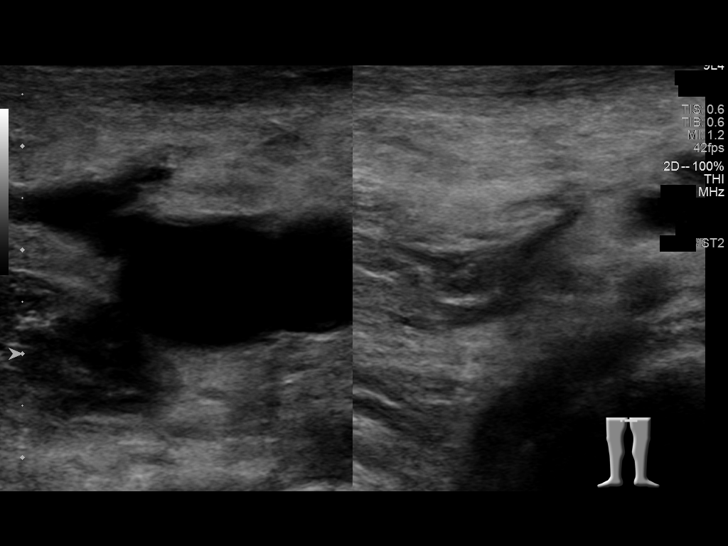
[im 46/67]
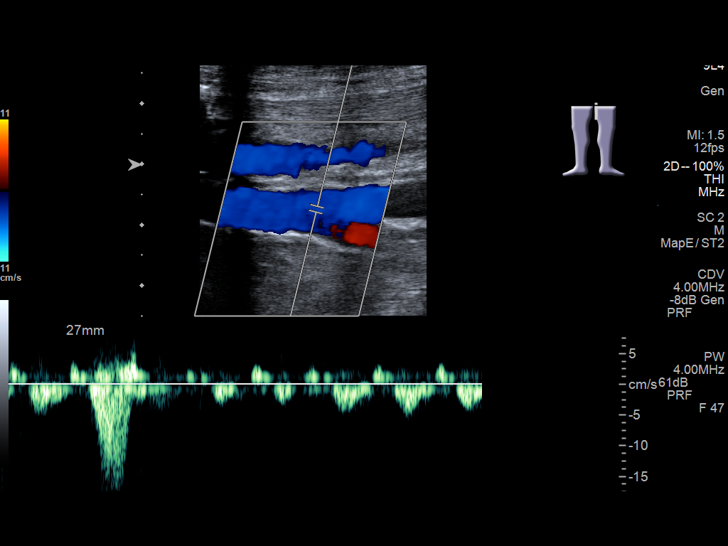
[im 52/67]
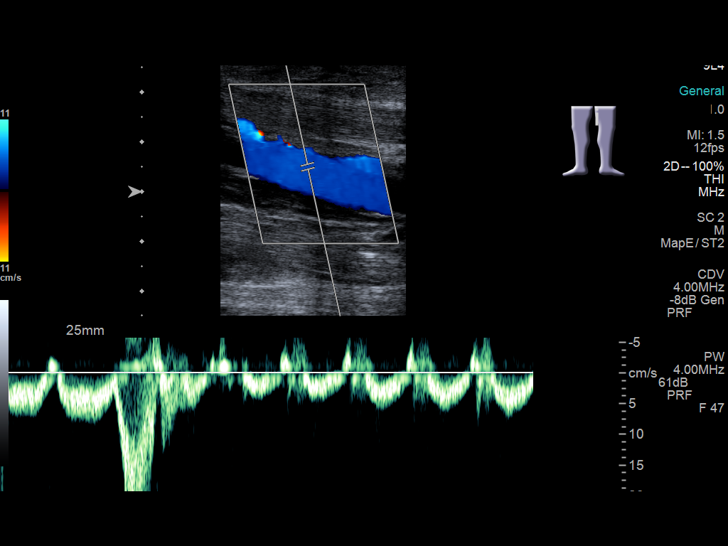
[im 55/67]
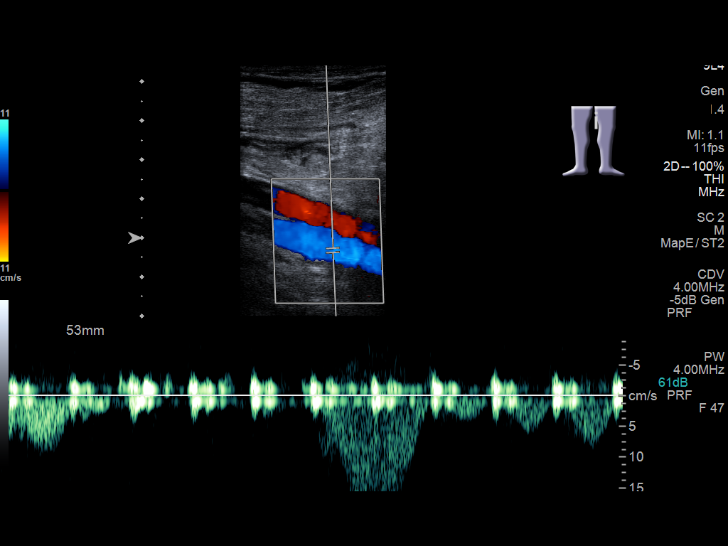
[im 61/67]
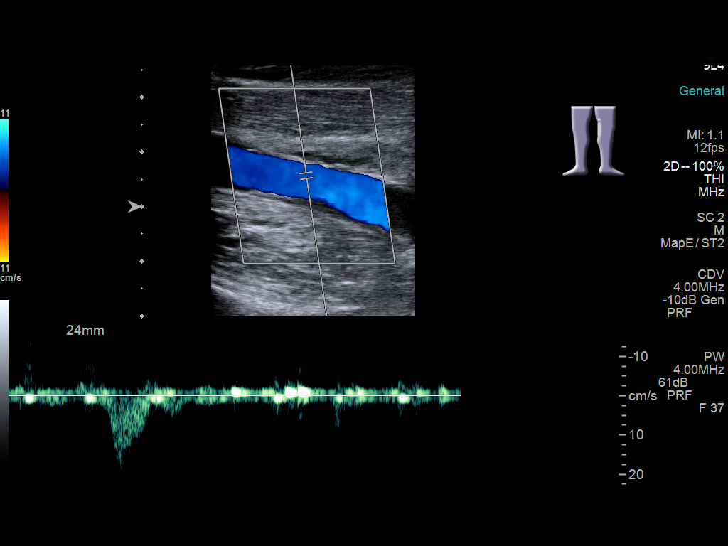
[im 67/67]
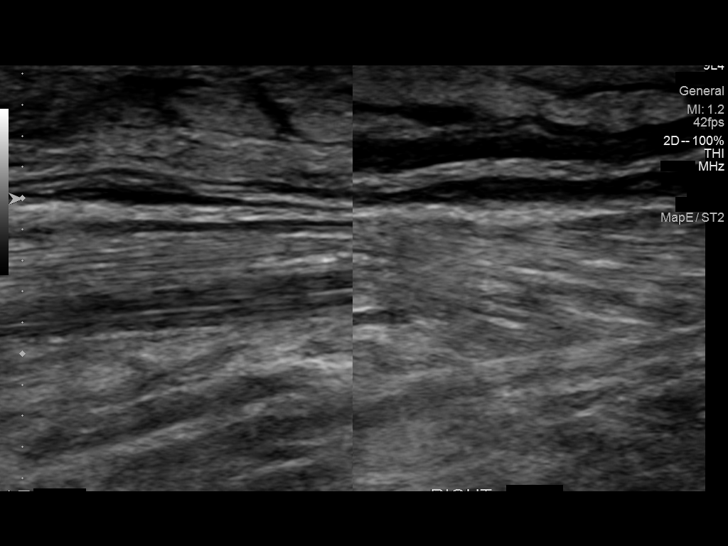

[14 of 24 positions shown; findings below may reference images not displayed]

FINDINGS: VENOUS

Normal compressibility of the BILATERAL common femoral, superficial
femoral, and popliteal veins, as well as the visualized BILATERAL
calf veins. Visualized portions of profunda femoral vein and great
saphenous vein unremarkable. No filling defects to suggest DVT on
grayscale or color Doppler imaging. Doppler waveforms show normal
direction of venous flow, normal respiratory plasticity and response
to augmentation.

Limited views of the contralateral common femoral vein are
unremarkable.

OTHER

None.

Limitations: none
IMPRESSION: Negative.

## 2020-10-25 IMAGING — CT CT ANGIO CHEST
2 of 6 series · 18 of 46 positions shown · IV contrast (APPLIED)
Comparison: None.

CLINICAL DATA: Lower abdominal swelling and lower extremity
swelling including ankles and feet. Mild SOB. High probability for
acute pulmonary embolus.

EXAM:
CT ANGIOGRAPHY CHEST WITH CONTRAST
TECHNIQUE: Multidetector CT imaging of the chest was performed using the
standard protocol during bolus administration of intravenous
contrast. Multiplanar CT image reconstructions and MIPs were
obtained to evaluate the vascular anatomy.
CONTRAST:  100mL OMNIPAQUE IOHEXOL 350 MG/ML SOLN

[Series 5: thins · axial · 0.64mm/px · z∈[-252,+15]mm · 15 of 293 slices shown]
[im 13/293  lung]
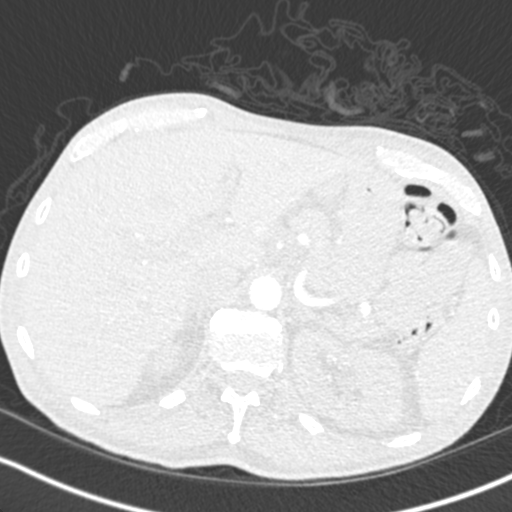
[im 39/293  soft-tissue]
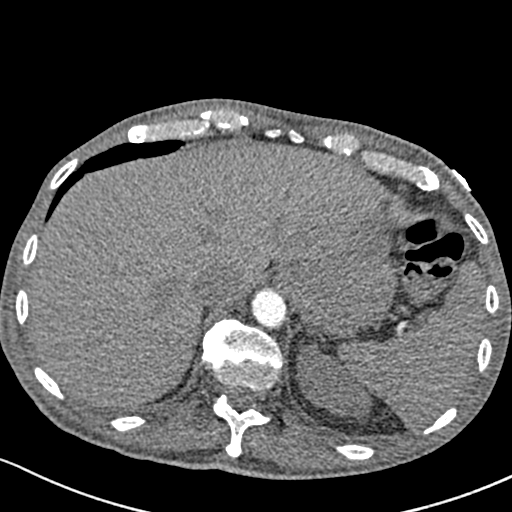
[im 51/293  lung]
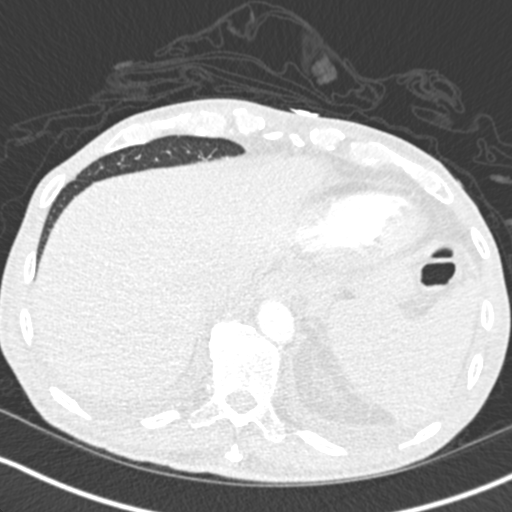
[im 77/293  soft-tissue]
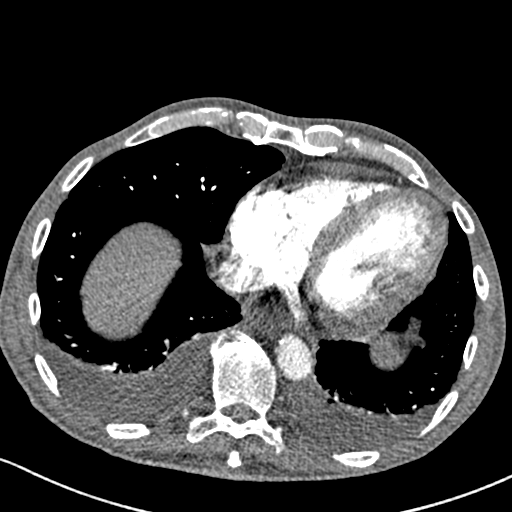
[im 89/293  lung]
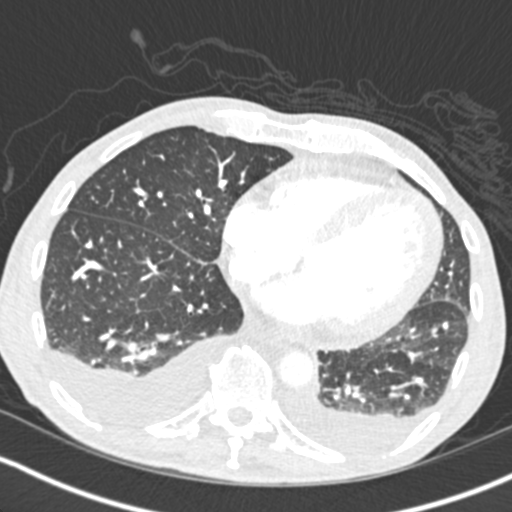
[im 115/293  soft-tissue]
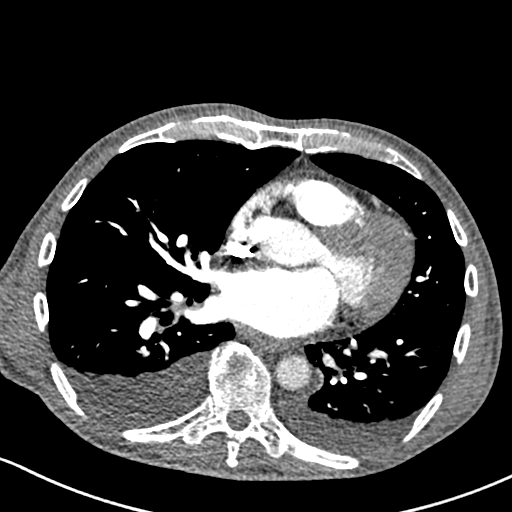
[im 127/293  lung]
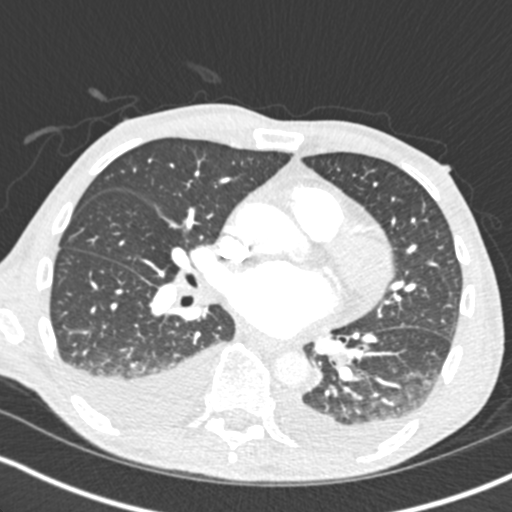
[im 153/293  soft-tissue]
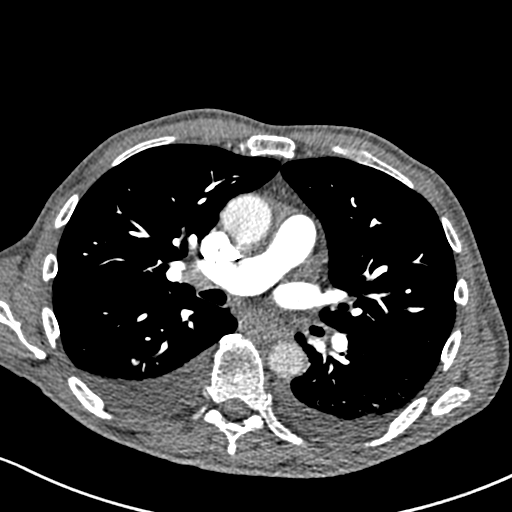
[im 166/293  lung]
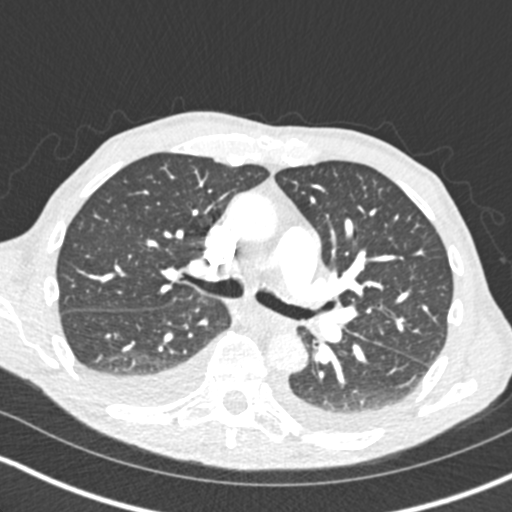
[im 178/293  soft-tissue]
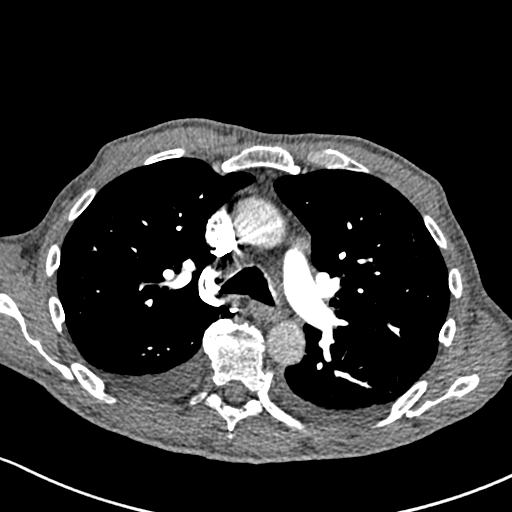
[im 204/293  lung]
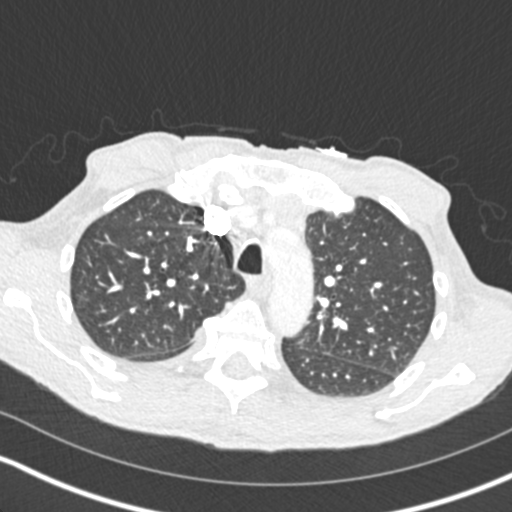
[im 216/293  soft-tissue]
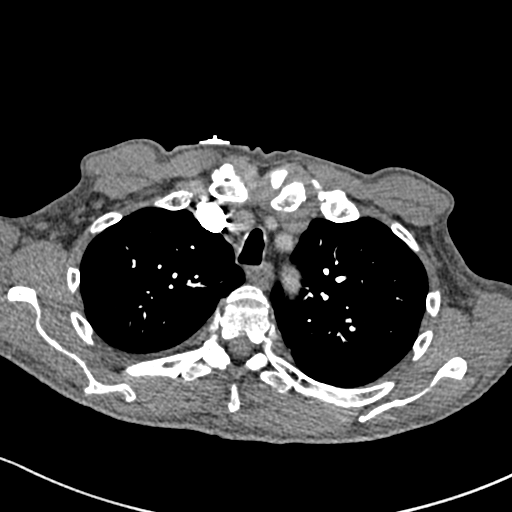
[im 242/293  lung]
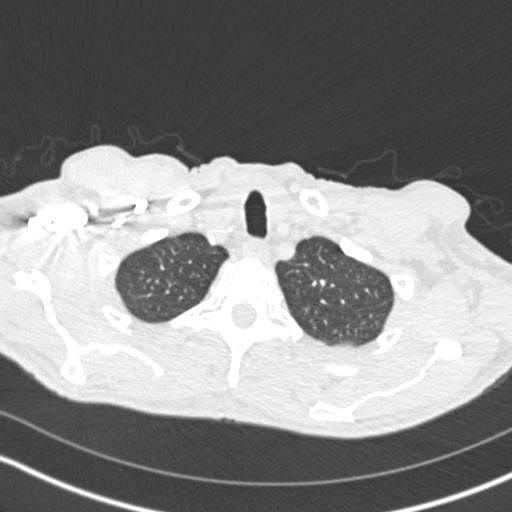
[im 254/293  soft-tissue]
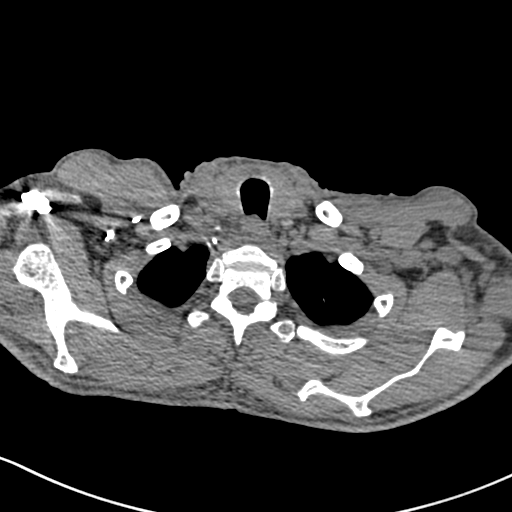
[im 280/293  lung]
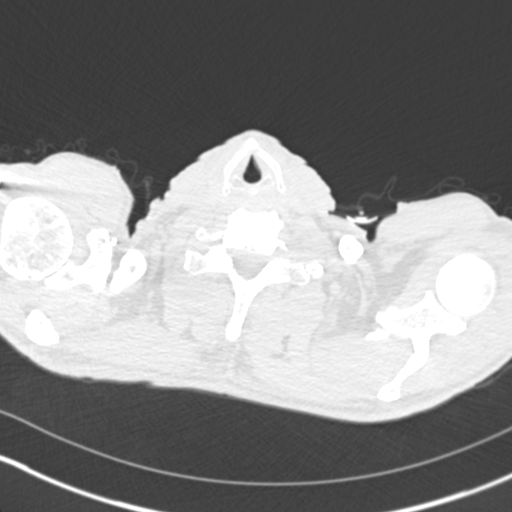

[Series 7: coronal mpr · coronal · 0.57mm/px · 3 of 83 slices shown]
[im 21/83  soft-tissue]
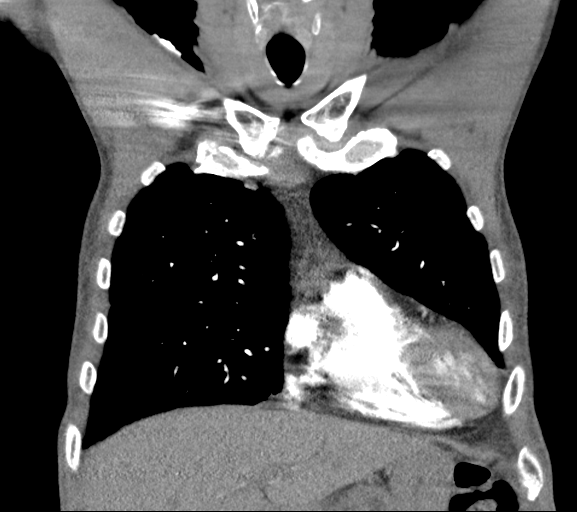
[im 42/83  soft-tissue]
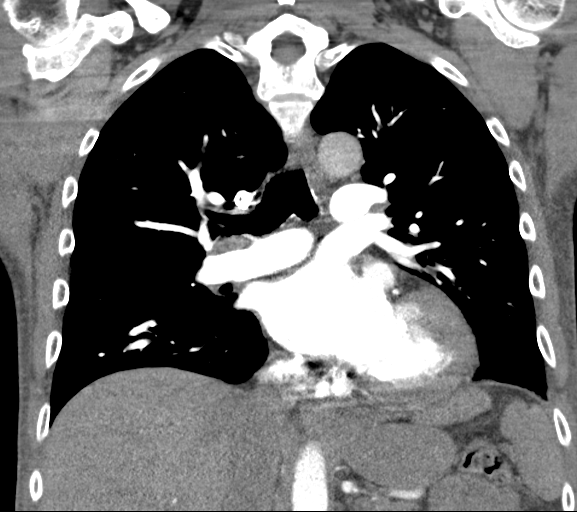
[im 62/83  soft-tissue]
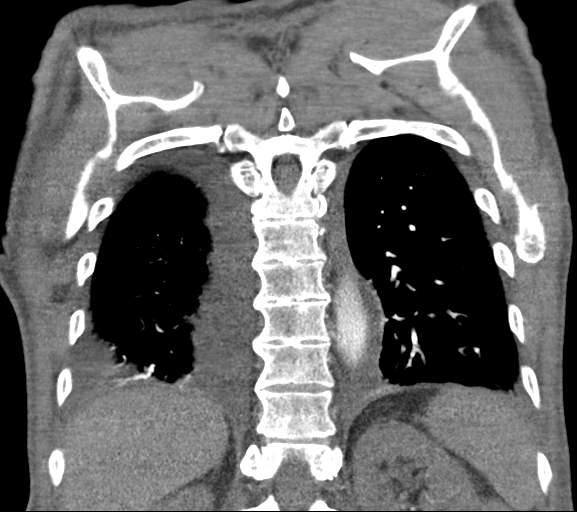

[18 of 46 positions shown; findings below may reference images not displayed]

FINDINGS: Cardiovascular: Heart size upper limits of normal. Trace pericardial
effusion.

The main pulmonary artery appears patent. No central obstructing
embolus. No lobar or segmental pulmonary artery embolus.

Mediastinum/Nodes: No enlarged mediastinal, hilar, or axillary lymph
nodes. Thyroid gland, trachea, and esophagus demonstrate no
significant findings.

Lungs/Pleura: Small to moderate bilateral pleural effusions, right
greater than left. Bilateral lower lung zone predominant
interlobular septal thickening is identified compatible with
interstitial edema. No superimposed airspace consolidation,
atelectasis or pneumothorax. No suspicious pulmonary nodule or mass.

Upper Abdomen: No acute findings.

Musculoskeletal: Mild spondylosis. Multilevel degenerative disc
disease within the thoracic spine. No acute or suspicious osseous
findings.

Review of the MIP images confirms the above findings.
IMPRESSION: 1. No evidence for acute pulmonary embolus.
2. Cardiac enlargement, bilateral pleural effusions and interstitial
edema compatible with CHF.

## 2020-10-25 IMAGING — CR DG CHEST 2V
1 series · 2 of 2 positions shown · non-contrast
Comparison: None.

CLINICAL DATA: Chest pain

EXAM:
CHEST - 2 VIEW

[Series 1: dg chest 2 view · 0.14mm/px · 2 of 2 slices shown]
[im 1/2]
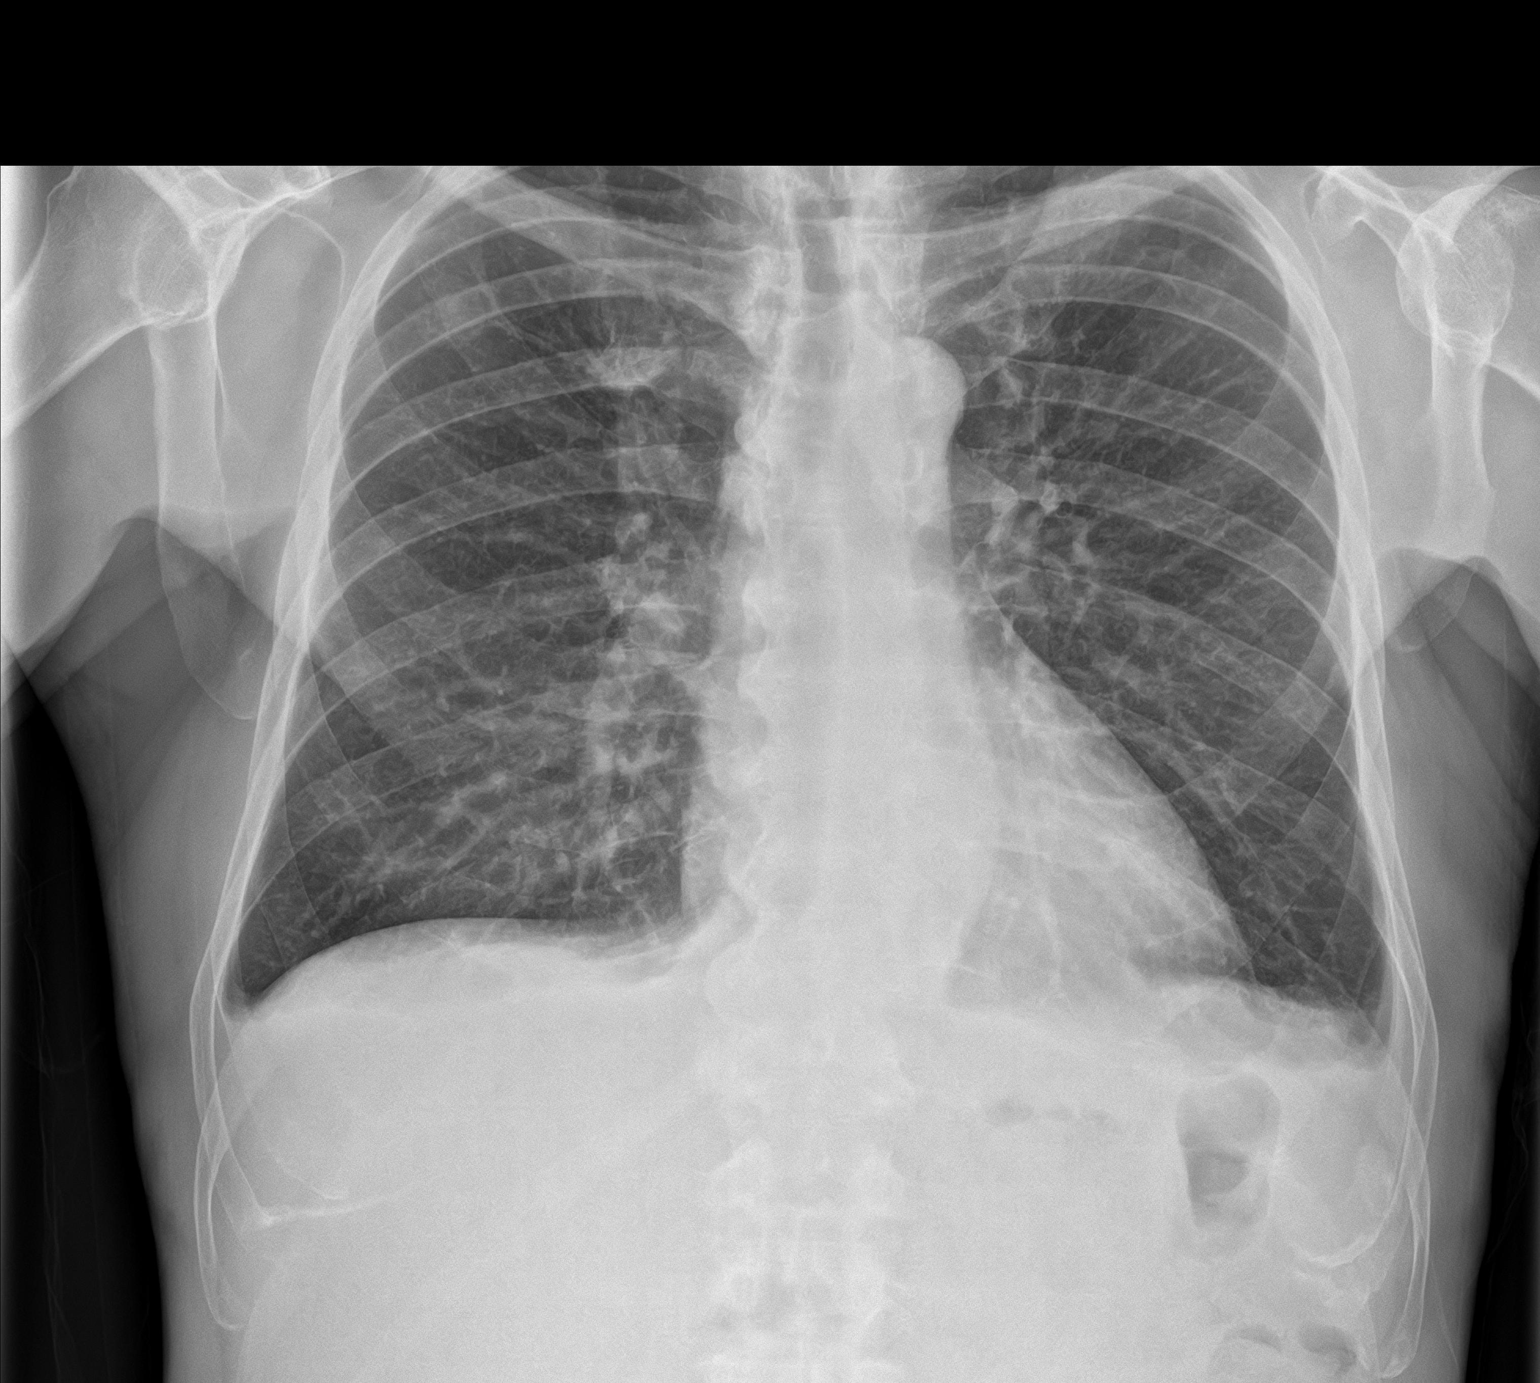
[im 2/2]
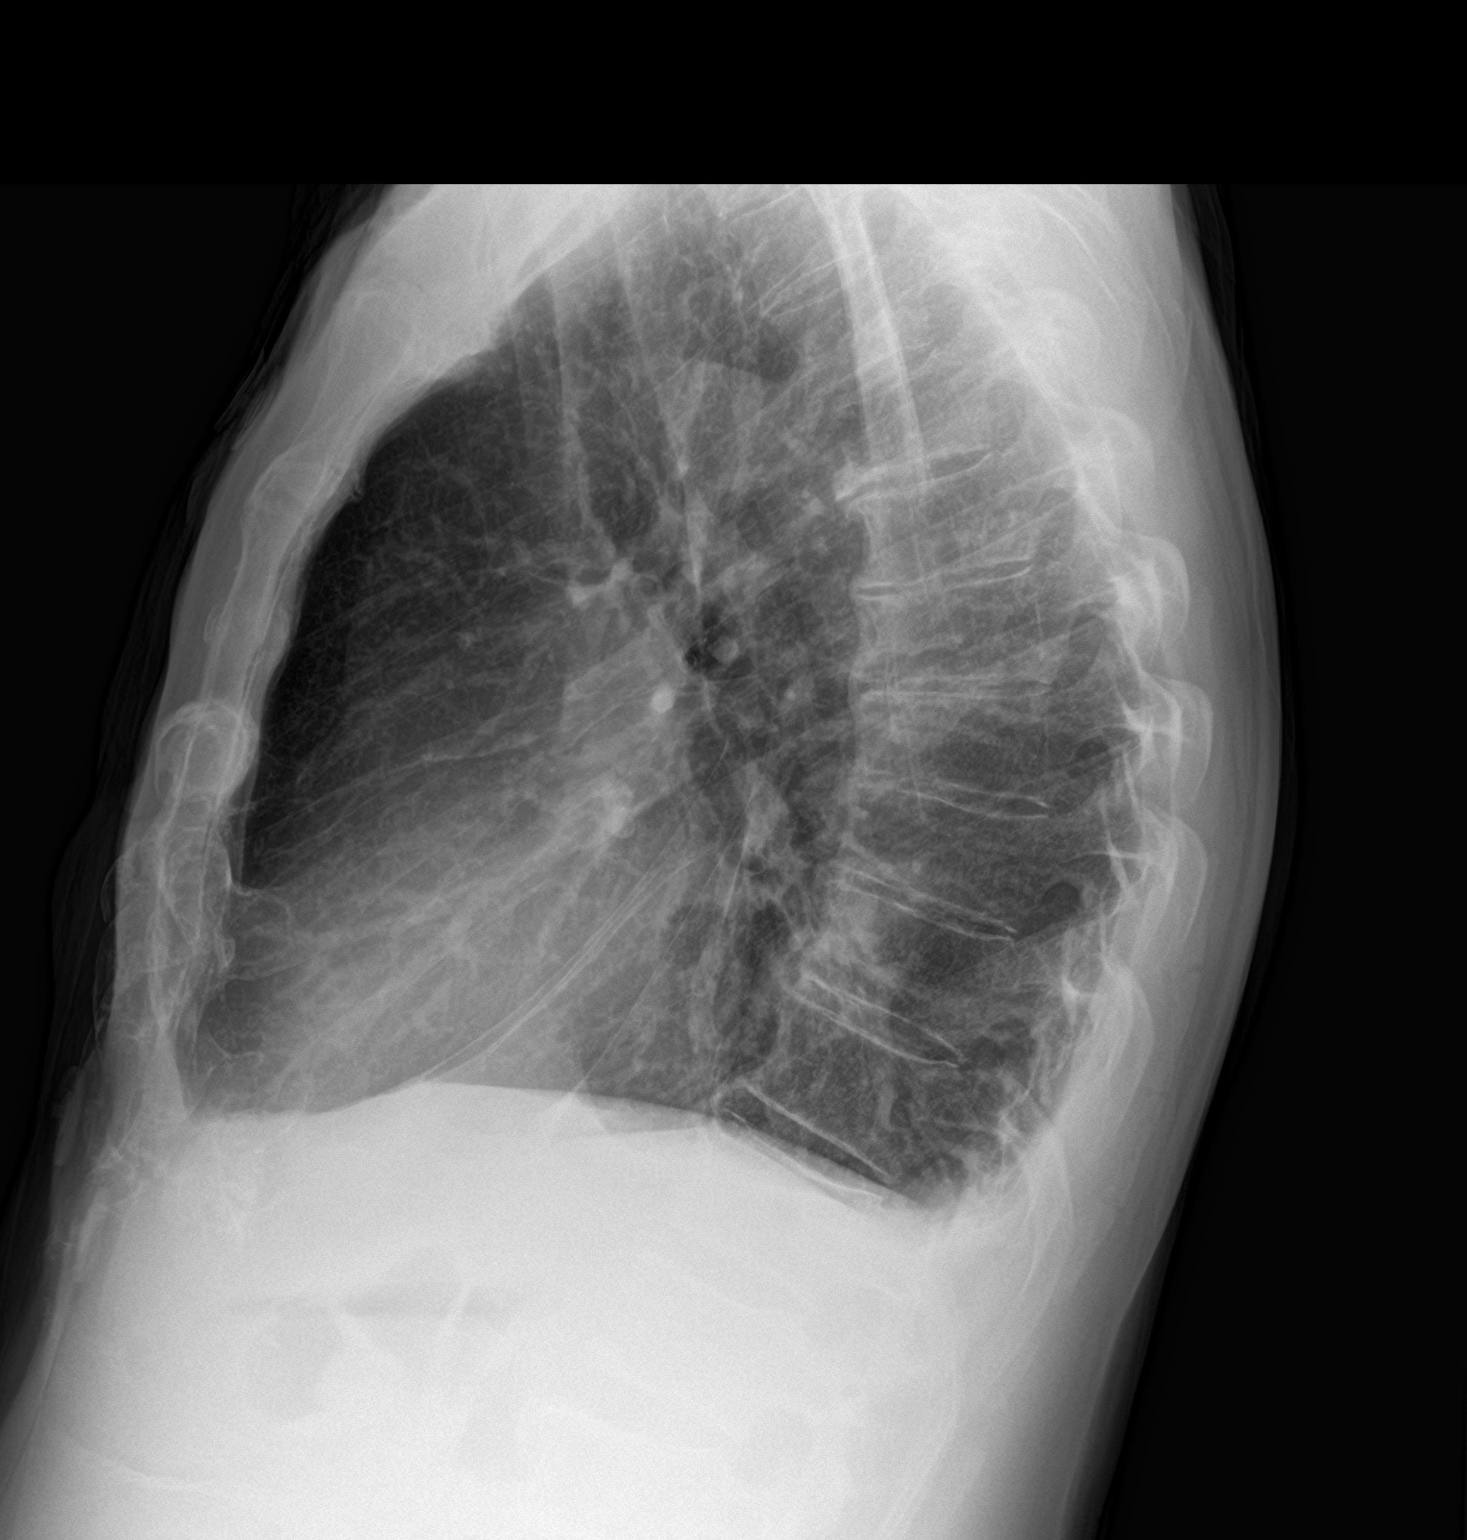

[2 of 2 positions shown; findings below may reference images not displayed]

FINDINGS: Normal heart size. Small bilateral pleural effusions noted. No
interstitial edema or airspace consolidation. Spondylosis noted
within the thoracic spine.
IMPRESSION: Small bilateral pleural effusions.

## 2020-10-25 IMAGING — CT CT VENOGRAM ABD-PELV
2 of 9 series · 12 of 46 positions shown, 17 images · IV contrast (APPLIED)
Comparison: [DATE]

CLINICAL DATA: Evaluate for portal vein thrombosis. Lower abdominal
swelling and lower extremity swelling.

EXAM:
CT ABDOMEN AND PELVIS WITH CONTRAST
TECHNIQUE: Multidetector CT imaging of the abdomen and pelvis was performed
using the standard protocol following bolus administration of
intravenous contrast.
CONTRAST:  100mL OMNIPAQUE IOHEXOL 350 MG/ML SOLN

[Series 2: axial portal venous · axial · portal-venous · 0.72mm/px · z∈[-584,-194]mm · 10 of 94 slices shown, 15 images]
[im 8/94  soft-tissue]
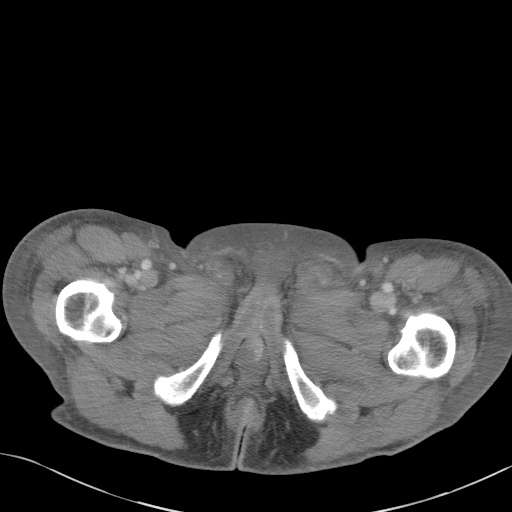
[im 8/94  bone]
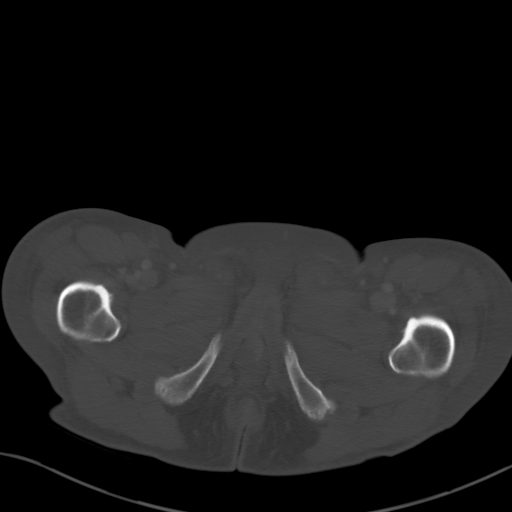
[im 16/94  soft-tissue]
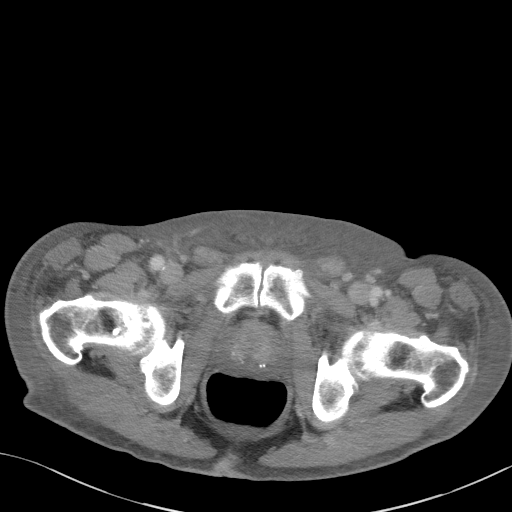
[im 32/94  soft-tissue]
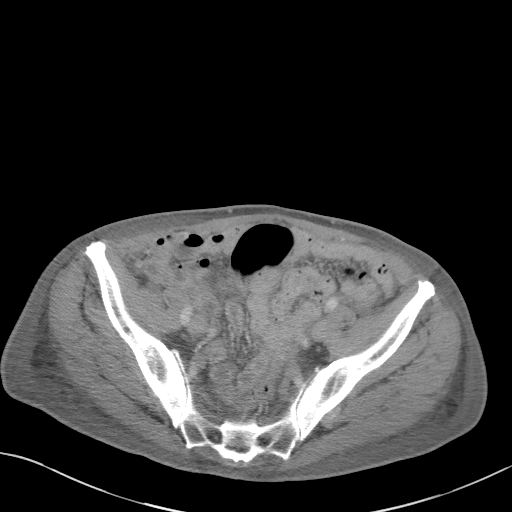
[im 39/94  soft-tissue]
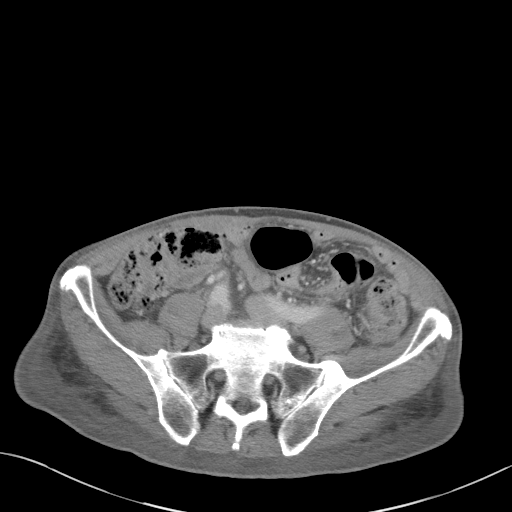
[im 47/94  soft-tissue]
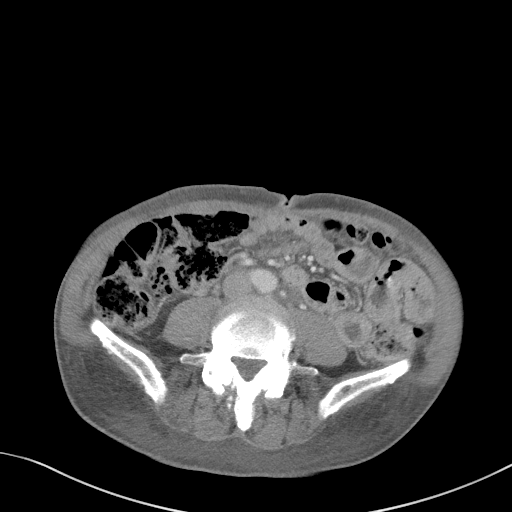
[im 55/94  soft-tissue]
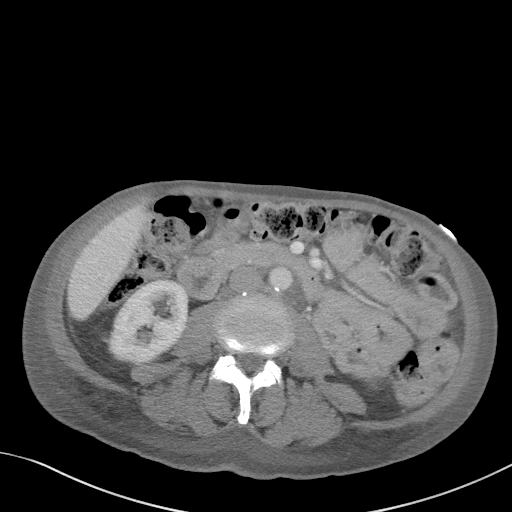
[im 63/94  soft-tissue]
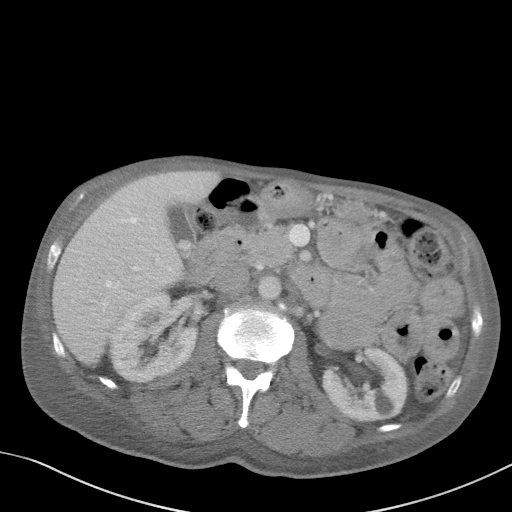
[im 63/94  lung]
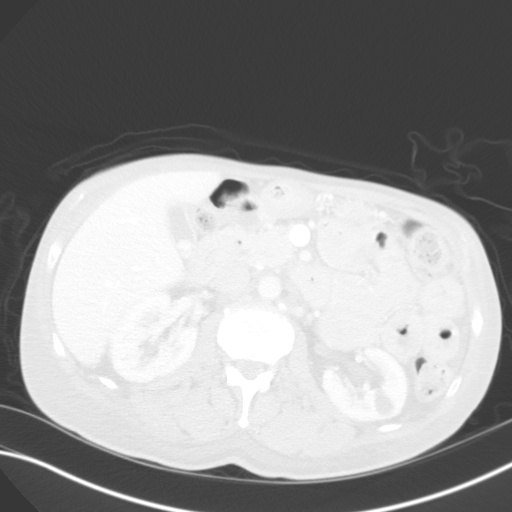
[im 70/94  lung]
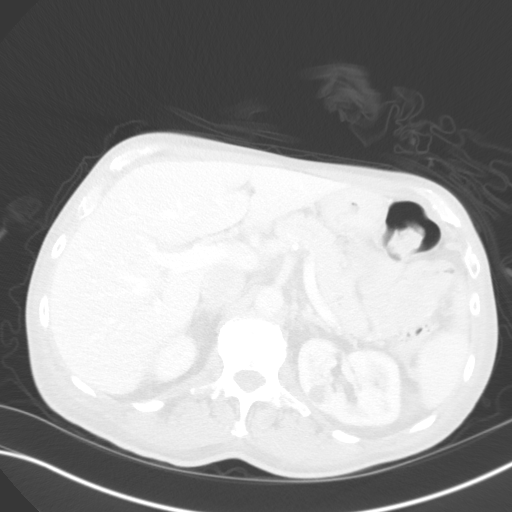
[im 78/94  soft-tissue]
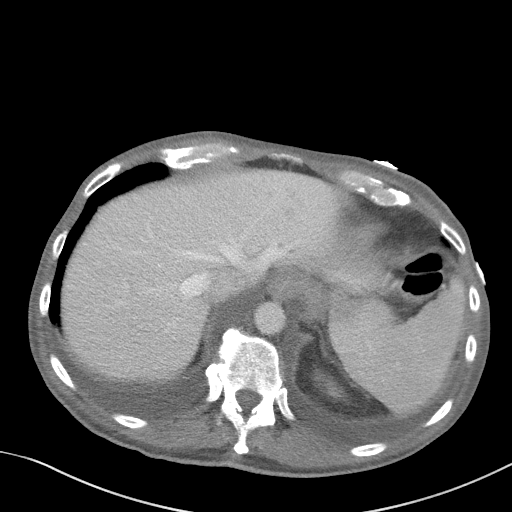
[im 78/94  lung]
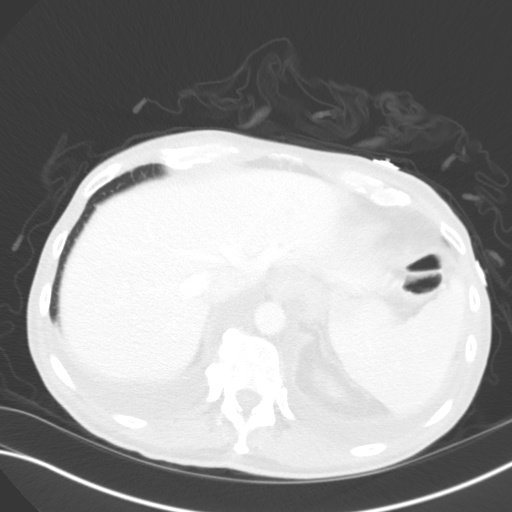
[im 86/94  soft-tissue]
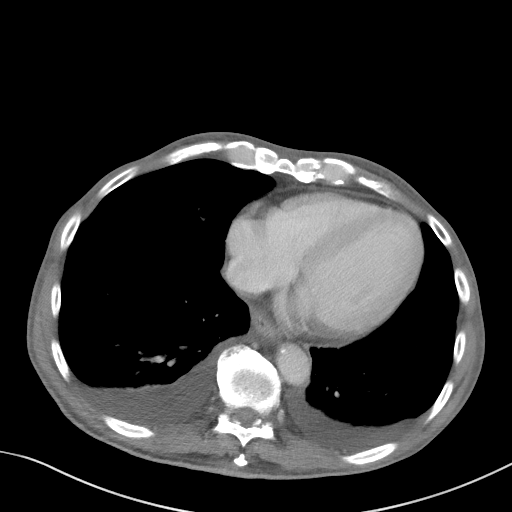
[im 86/94  lung]
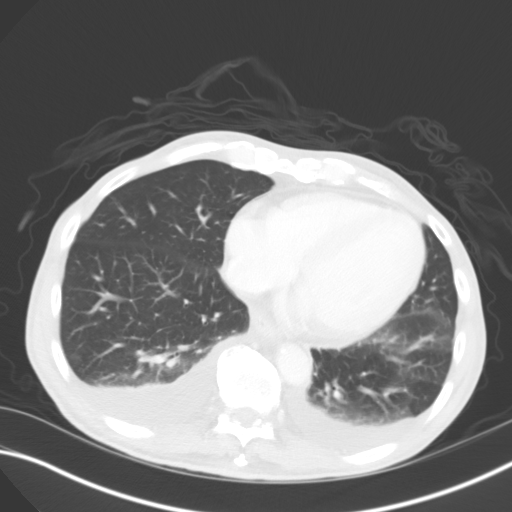
[im 86/94  bone]
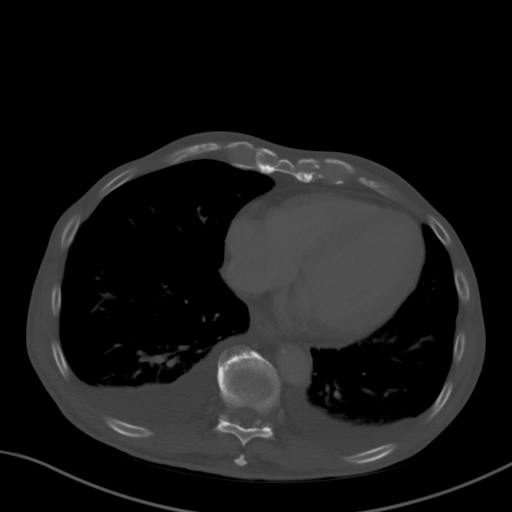

[Series 3: coronals · coronal · 0.70mm/px · 2 of 128 slices shown]
[im 43/128  soft-tissue]
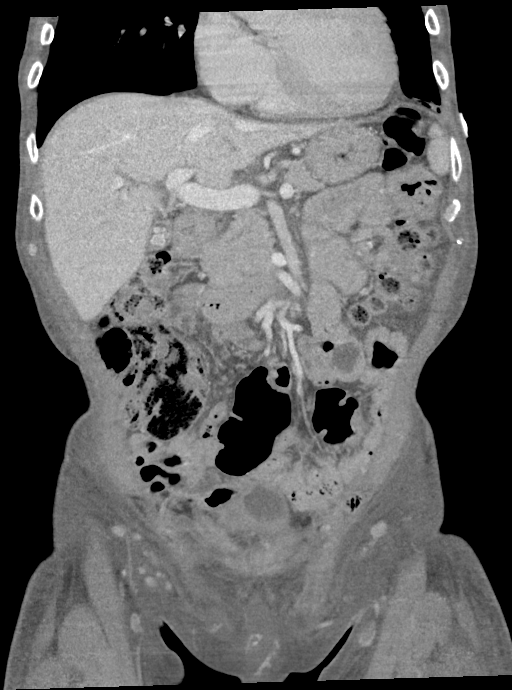
[im 85/128  soft-tissue]
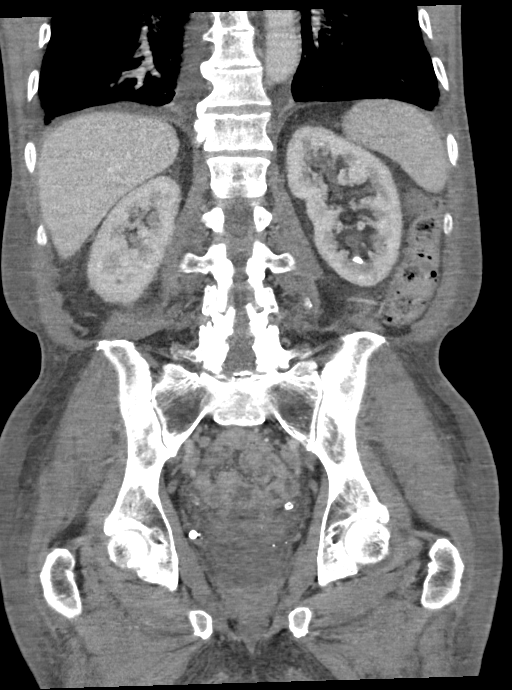

[12 of 46 positions shown; findings below may reference images not displayed]

FINDINGS: Lower chest: Bilateral pleural effusions and mild interstitial
edema.

Hepatobiliary: No focal liver abnormality. Mild periportal edema.
Gallstones are noted which measure up to 9 mm, image 32/2. No
gallbladder wall thickening or bile duct dilatation.

Pancreas: Unremarkable. No pancreatic ductal dilatation or
surrounding inflammatory changes.

Spleen: Normal in size without focal abnormality.

Adrenals/Urinary Tract: Normal appearance of the adrenal glands.
Bilateral kidney stones. The largest stone is in the inferior pole
of the left kidney measuring 6 mm, image 36/2. Intermediate
attenuating cyst arising off the lateral cortex of the left kidney
is unchanged from [DATE] measuring 3 cm, image [DATE]. Additional
bilateral kidney cysts also appears similar to previous study
including 1.9 cm cyst within posterolateral cortex of the inferior
pole of the left kidney. Right lateral cortex hypodense lesion
measures 7 mm and is too small to characterize. No hydronephrosis
identified bilaterally. Tiny linear calcifications noted within the
dependent portion of the bladder. Bladder otherwise unremarkable.

Stomach/Bowel: Stomach appears nondistended. No bowel wall
thickening, inflammation, or distension.

Vascular/Lymphatic: Mild aortic atherosclerosis. No aneurysm. Patent
vasculature. No signs of portal vein thrombosis. No signs of clot
within the inferior vena cava, bilateral iliac veins common femoral
veins.

Reproductive: Mild prostate gland enlargement.

Other: No free fluid or fluid collections within the abdomen or
pelvis.

Musculoskeletal: Diffuse body wall edema compatible with anasarca.
No discrete fluid collection identified. No acute or suspicious
osseous findings.
IMPRESSION: 1. No evidence for portal vein thrombosis.
2. Bilateral pleural effusions, mild pulmonary interstitial edema
and diffuse body wall edema. Correlate for any clinical signs or
symptoms of CHF.
3. Gallstones.
4. Bilateral kidney cysts.
5. Aortic atherosclerosis.

Aortic Atherosclerosis ([BO]-[BO]).

## 2020-10-25 MED ORDER — IOHEXOL 350 MG/ML SOLN
100.0000 mL | Freq: Once | INTRAVENOUS | Status: AC | PRN
  Administered 2020-10-25: 100 mL via INTRAVENOUS
  Filled 2020-10-25: qty 100

## 2020-10-25 MED ORDER — FUROSEMIDE 20 MG PO TABS: 20.0000 mg | ORAL_TABLET | Freq: Every day | ORAL | 0 refills | Status: DC

## 2020-10-25 MED ORDER — POTASSIUM CHLORIDE ER 10 MEQ PO TBCR: 20.0000 meq | EXTENDED_RELEASE_TABLET | Freq: Every day | ORAL | 0 refills | Status: DC

## 2020-10-25 MED ORDER — POTASSIUM CHLORIDE CRYS ER 20 MEQ PO TBCR
40.0000 meq | EXTENDED_RELEASE_TABLET | Freq: Once | ORAL | Status: AC
  Administered 2020-10-25: 40 meq via ORAL
  Filled 2020-10-25: qty 2

## 2020-10-25 MED ORDER — POTASSIUM CHLORIDE 10 MEQ/100ML IV SOLN
10.0000 meq | INTRAVENOUS | Status: AC
  Administered 2020-10-25: 10 meq via INTRAVENOUS
  Filled 2020-10-25: qty 100

## 2020-10-25 NOTE — Telephone Encounter (Signed)
I spoke with pt; 3 days ago pt started with lower abd swelling and swelling in entire both legs including ankles and feet; cannot see ankles. When had hernia surgery 4 wks ago weighed 110 lbs and now pt weighs 140 lbs. No CP but has slight SOB. Pt said difficult to get surgeon to call pt; pts son called hernia surgeon on 10/24/20 and hasnot received cb. Pt has bilateral leg pain and on and off lower abd pain. Pt is concerned about possible blood clot.  Someone is coming now to take pt to [facility] ED. Pt will cb with update on how doing. Sending note to Dr Einar Pheasant.

## 2020-10-25 NOTE — Telephone Encounter (Signed)
Agree with urgent evaluation

## 2020-10-25 NOTE — ED Triage Notes (Addendum)
Pt presents to the ED for bilateral leg and foot swelling. Pt states that he had a hernia repair surgery around 4 weeks ago. Pt states that the swelling and soreness started over this weekend. Pt states he does have some mild SOB. Denies CP. Pt is A&Ox4 and NAD.

## 2020-10-25 NOTE — Discharge Instructions (Addendum)
This is concerning for new heart failure.  I put in referral for the heart failure clinic.  They should call you to make an appointment.  You should take the Lasix with potassium to prevent your potassium from going any lower but you will need to have labs rechecked with the heart failure clinic to make sure that is not getting any lower in the next few days.   Return to the ER if you develop worsening shortness of breath because you may need to be admitted for IV Lasix    IMPRESSION:  1. No evidence for portal vein thrombosis.  2. Bilateral pleural effusions, mild pulmonary interstitial edema  and diffuse body wall edema. Correlate for any clinical signs or  symptoms of CHF.  3. Gallstones.  4. Bilateral kidney cysts.  5. Aortic atherosclerosis.

## 2020-10-25 NOTE — ED Notes (Signed)
Pt ambulatory in hall with steady gait. Pt maintained oxygen saturation of 100% on room air.

## 2020-10-25 NOTE — ED Provider Notes (Signed)
[facility] Emergency Department Provider Note  ____________________________________________   Event Date/Time   First MD Initiated Contact with Patient 10/25/20 1619     (approximate)  I have reviewed the triage vital signs and the nursing notes.   HISTORY  Chief Complaint Leg Swelling    HPI Kyle Ramsey is a 65 y.o. male with diabetes who comes in with leg swelling.  Patient states that he had hernia surgery done 1 month ago.  He is been recovering normally.  But then he developed 4 days ago sudden onset of severe leg swelling bilaterally that goes up into his testicles and up into his lower abdomen that is constant, severe, nothing makes it better, nothing makes it worse.  Denies ever having swelling before any heart issues in the past.  No history of blood clots but is not on any blood thinners.  Denies any chest pain but does have a little bit of shortness of breath as well.          Past Medical History:  Diagnosis Date  . Diabetes mellitus ([place])   . Kidney calculi     Patient Active Problem List   Diagnosis Date Noted  . Atrial bigeminy 09/08/2020  . Lymphocytic colitis 09/08/2020  . Essential hypertension 09/08/2020  . Neuropathy 09/08/2020  . Cataract 09/08/2020  . Myopathy 09/08/2020  . Tobacco abuse 09/08/2020  . Aortic atherosclerosis ([place]) 09/08/2020  . Bilateral inguinal hernia, without obstruction or gangrene, not specified as recurrent 06/27/2020  . Type 2 diabetes mellitus with other specified complication ([place]) 58/52/7782    History reviewed. No pertinent surgical history.  Prior to Admission medications   Medication Sig Start Date End Date Taking? Authorizing Provider  dapagliflozin propanediol (FARXIGA) 5 MG TABS tablet Take 1 tablet (5 mg total) by mouth daily before breakfast. 10/11/20   Lesleigh Noe, MD  diphenoxylate-atropine (LOMOTIL) 2.5-0.025 MG tablet Take 1 tablet by mouth 4 (four) times daily. Patient not  taking: Reported on 10/11/2020 07/09/20   [provider]  gabapentin (NEURONTIN) 100 MG capsule Take 1 tablet four times daily, along with 300mg  tablet = 400mg  four times daily. Patient not taking: Reported on 10/11/2020 07/15/20   Narda Amber K, DO  gabapentin (NEURONTIN) 300 MG capsule TAKE 2 CAPSULES (600 MG) AT 7AM, 2 CAPS AT 11AM, 2 CAPS AT 3PM & 3 CAPSULES (900 MG) AT BEDTIME 10/14/20   Patel, Donika K, DO  hyoscyamine (ANASPAZ) 0.125 MG TBDP disintergrating tablet Take by mouth as needed. 08/29/20   [provider]  ibuprofen (ADVIL) 600 MG tablet Take 1 tablet (600 mg total) by mouth every 6 (six) hours as needed. 06/14/19   Lamptey, Myrene Galas, MD  nystatin ointment (MYCOSTATIN) Apply topically as needed. 08/01/20   [provider]    Allergies Azithromycin, Codeine, Erythromycin, Glipizide, Metformin hcl er, Onion, and Valsartan  Family History  Problem Relation Age of Onset  . Diabetes Mother   . Dementia Mother   . Breast cancer Mother   . Diabetes Father   . Dementia Father     Social History Social History   Tobacco Use  . Smoking status: Current Every Day Smoker    Packs/day: 0.50    Years: 40.00    Pack years: 20.00    Types: Cigarettes  . Smokeless tobacco: Never Used  . Tobacco comment: 1 ppd 40 years, cut back to 0.5 ppd  Vaping Use  . Vaping Use: Never used  Substance Use Topics  .  Alcohol use: Not Currently    Comment: nothing   . Drug use: Never      Review of Systems Constitutional: No fever/chills Eyes: No visual changes. ENT: No sore throat. Cardiovascular: Denies chest pain. Respiratory: Positive shortness of breath Gastrointestinal: No abdominal pain.  No nausea, no vomiting.  No diarrhea.  No constipation. Genitourinary: Negative for dysuria.  Positive swelling in testicles Musculoskeletal: Negative for back pain.  Positive for leg swelling Skin: Negative for rash. Neurological: Negative for headaches, focal weakness  or numbness. All other ROS negative ____________________________________________   PHYSICAL EXAM:  VITAL SIGNS: ED Triage Vitals  Enc Vitals Group     BP 10/25/20 1420 (!) 169/90     Pulse Rate 10/25/20 1420 95     Resp 10/25/20 1420 20     Temp 10/25/20 1420 98.5 F (36.9 C)     Temp Source 10/25/20 1420 Oral     SpO2 10/25/20 1420 99 %     Weight 10/25/20 1422 140 lb (63.5 kg)     Height 10/25/20 1422 5\' 7"  (1.702 m)     Head Circumference --      Peak Flow --      Pain Score 10/25/20 1421 0     Pain Loc --      Pain Edu? --      Excl. in [place]? --     Constitutional: Alert and oriented. Well appearing and in no acute distress. Eyes: Conjunctivae are normal. EOMI. Head: Atraumatic. Nose: No congestion/rhinnorhea. Mouth/Throat: Mucous membranes are moist.   Neck: No stridor. Trachea Midline. FROM Cardiovascular: Normal rate, regular rhythm. Grossly normal heart sounds.  Good peripheral circulation. Respiratory: Normal respiratory effort.  No retractions. Lungs CTAB. Gastrointestinal: Soft and nontender. No distention. No abdominal bruits.  Musculoskeletal: 1+ edema throughout his feet up into his legs no joint effusions. Neurologic:  Normal speech and language. No gross focal neurologic deficits are appreciated.  Skin:  Skin is warm, dry and intact. No rash noted. Psychiatric: Mood and affect are normal. Speech and behavior are normal. GU: Mild swelling bilaterally testicles without any erythema or redness.  ____________________________________________   LABS (all labs ordered are listed, but only abnormal results are displayed)  Labs Reviewed  BASIC METABOLIC PANEL - Abnormal; Notable for the following components:      Result Value   Potassium 2.9 (*)    CO2 33 (*)    Glucose, Bld 212 (*)    Calcium 8.5 (*)    All other components within normal limits  CBC - Abnormal; Notable for the following components:   RBC 3.27 (*)    Hemoglobin 10.8 (*)    HCT 31.3 (*)     All other components within normal limits  BRAIN NATRIURETIC PEPTIDE - Abnormal; Notable for the following components:   B Natriuretic Peptide 512.3 (*)    All other components within normal limits  HEPATIC FUNCTION PANEL - Abnormal; Notable for the following components:   Total Protein 5.9 (*)    Albumin 3.3 (*)    AST 14 (*)    All other components within normal limits  MAGNESIUM  URINALYSIS, COMPLETE (UACMP) WITH MICROSCOPIC  TROPONIN I (HIGH SENSITIVITY)  TROPONIN I (HIGH SENSITIVITY)   ____________________________________________   ED ECG REPORT I, Vanessa St. Francis, the attending physician, personally viewed and interpreted this ECG.  Normal sinus rate of 90, no ST elevation, no T wave inversions, normal intervals ____________________________________________  RADIOLOGY Robert Bellow, personally viewed and evaluated  these images (plain radiographs) as part of my medical decision making, as well as reviewing the written report by the radiologist.  ED MD interpretation: Small bilateral pleural effusions  Official radiology report(s): DG Chest 2 View  Result Date: 10/25/2020 CLINICAL DATA:  Chest pain EXAM: CHEST - 2 VIEW COMPARISON:  None. FINDINGS: Normal heart size. Small bilateral pleural effusions noted. No interstitial edema or airspace consolidation. Spondylosis noted within the thoracic spine. IMPRESSION: Small bilateral pleural effusions. Electronically Signed   By: Kerby Moors M.D.   On: 10/25/2020 15:04    ____________________________________________   PROCEDURES  Procedure(s) performed (including Critical Care):  Procedures   ____________________________________________   INITIAL IMPRESSION / ASSESSMENT AND PLAN / ED COURSE  Kyle Ramsey was evaluated in Emergency Department on 10/25/2020 for the symptoms described in the history of present illness. He was evaluated in the context of the global COVID-19 pandemic, which necessitated consideration  that the patient might be at risk for infection with the SARS-CoV-2 virus that causes COVID-19. Institutional protocols and algorithms that pertain to the evaluation of patients at risk for COVID-19 are in a state of rapid change based on information released by regulatory bodies including the CDC and federal and state organizations. These policies and algorithms were followed during the patient's care in the ED.    Labs ordered to evaluate for Electra abnormalities, liver dysfunction, nephrotic syndrome, heart failure.  Given patient's recent surgery and the swelling goes all the way up into his abdomen I am concerned about the possibility of venous thrombosis in his abdomen.  I think we should a CTV to further evaluate.  Also given his shortness of breath we can proceed with CT PE to rule out pulmonary embolism and ultrasound to rule out DVT.  Seems all strange for him to have sudden onset heart failure like this with no history of heart issues and I want to rule out blood clot  His potassium is slightly low at 2.9.  We will give some oral repletion.  Glucose was slightly elevated to 12 but no anion gap elevation to suggest DKA.  Hemoglobin is slightly lower than prior at 10.8 but this could just be dilutional.  He denies any dark stools or concern for bleeding.  His white count is normal making infection less likely.  His BNP is elevated at 512 which is concerning for the potential of heart failure.  Magnesium is normal  Patient got 10 of IV potassium and 40 p.o.  CT scans were negative for other acute pathology although patient did have pulmonary edema  Discussed with patient the importance of taking potassium with the Lasix that I was going to prescribe him.  Patient expressed understanding and will take the potassium pills.  We will give a 7-day course and refer him to the heart failure clinic.  Patient has a ambulatory sat of 99% does not require admission at this time but will need close follow-up  with the heart failure clinic       ____________________________________________   FINAL CLINICAL IMPRESSION(S) / ED DIAGNOSES   Final diagnoses:  Acute heart failure, unspecified heart failure type ([place])  Hypokalemia      MEDICATIONS GIVEN DURING THIS VISIT:  Medications  potassium chloride 10 mEq in 100 mL IVPB (0 mEq Intravenous Stopped 10/25/20 1949)  potassium chloride SA (KLOR-CON) CR tablet 40 mEq (40 mEq Oral Given 10/25/20 1839)  iohexol (OMNIPAQUE) 350 MG/ML injection 100 mL (100 mLs Intravenous Contrast Given 10/25/20 1744)  ED Discharge Orders         Ordered    furosemide (LASIX) 20 MG tablet  Daily        10/25/20 2012    potassium chloride (KLOR-CON) 10 MEQ tablet  Daily        10/25/20 2012    AMB referral to CHF clinic        10/25/20 2013           Note:  This document was prepared using Dragon voice recognition software and may include unintentional dictation errors.   Vanessa Cumberland, MD 10/25/20 2014

## 2020-10-25 NOTE — Telephone Encounter (Signed)
[facility] - Client TELEPHONE ADVICE RECORD [facility] Patient Name: Kyle Ramsey Gender: Male DOB: Nov 29, 1955 Age: 65 Y 9 M 19 D Return Phone Number: [phone] (Primary), [phone] (Secondary) Address[place] [address] Client [facility] - Client Client Site [facility] - Day Physician Waunita Schooner- MD Contact Type Call Who Is Calling Patient / Member / Family / Caregiver Call Type Triage / Clinical Relationship To Patient Self Return Phone Number [phone] (Primary) Chief Complaint Swelling (generalized) Reason for Call Symptomatic / Request for [place] states, pt had double hernia surgery and having leg/ stomach swelling. Translation No Nurse Assessment Nurse: Ysidro Evert, RN, Levada Dy Date/Time (Eastern Time): 10/25/2020 10:24:36 AM Confirm and document reason for call. If symptomatic, describe symptoms. ---Caller states he is having leg swelling that started about 3 days ago. He had hernia surgery about a month ago. No other symptoms Does the patient have any new or worsening symptoms? ---Yes Will a triage be completed? ---Yes Related visit to physician within the last 2 weeks? ---No Does the PT have any chronic conditions? (i.e. diabetes, asthma, this includes High risk factors for pregnancy, etc.) ---Yes List chronic conditions. ---diabetes, neuropathy Is this a behavioral health or substance abuse call? ---No Guidelines Guideline Title Affirmed Question Affirmed Notes Nurse Date/Time Eilene Ghazi Time) Leg Swelling and Edema SEVERE leg swelling (e.g., swelling extends above knee, entire leg is swollen, weeping fluid) Ysidro Evert, RN, Levada Dy 10/25/2020 10:27:21 AM Disp. Time Eilene Ghazi Time) Disposition Final User 10/25/2020 10:36:16 AM See HCP within 4 Hours (or PCP triage) Yes Ysidro Evert, RN, Marin Shutter Disagree/Comply Comply PLEASE  NOTE: All timestamps contained within this report are represented as [place] Standard Time. CONFIDENTIALTY NOTICE: This fax transmission is intended only for the addressee. It contains information that is legally privileged, confidential or otherwise protected from use or disclosure. If you are not the intended recipient, you are strictly prohibited from reviewing, disclosing, copying using or disseminating any of this information or taking any action in reliance on or regarding this information. If you have received this fax in error, please notify us immediately by telephone so that we can arrange for its return to [place]. Phone: [phone], Toll-Free: [phone], Fax: [phone] Page: 2 of 2 Call Id: [id number] [place] Understands Yes PreDisposition Did not know what to do Care Advice Given Per Guideline SEE HCP (OR PCP TRIAGE) WITHIN 4 HOURS: * IF OFFICE WILL BE OPEN: You need to be seen within the next 3 or 4 hours. Call your doctor (or NP/PA) now or as soon as the office opens. CALL BACK IF: * You become worse CARE ADVICE given per Leg Swelling and Edema (Adult) guideline. Referrals GO TO FACILITY UNDECIDED

## 2020-10-27 ENCOUNTER — Telehealth: Payer: Self-pay | Admitting: Family Medicine

## 2020-10-27 NOTE — Telephone Encounter (Signed)
Patient returned Kyle Ramsey's call.  Patient said she called to find out how he was doing after being seen at [facility] ER. Please call patient.

## 2020-10-27 NOTE — Telephone Encounter (Signed)
Noted he has an appt with the heart failure clinic tomorrow. Appreciate their support

## 2020-10-27 NOTE — Telephone Encounter (Signed)
Spoke with patient who stated that he is doing better after his visit to the ER. Patient stated that he has an ECHO scheduled for tomorrow. Please refer to ER notes for further details.

## 2020-10-28 ENCOUNTER — Ambulatory Visit: Payer: Medicare Other | Attending: Family | Admitting: Family

## 2020-10-28 ENCOUNTER — Encounter: Payer: Self-pay | Admitting: Family

## 2020-10-28 ENCOUNTER — Other Ambulatory Visit: Payer: Self-pay

## 2020-10-28 VITALS — BP 163/101 | HR 85 | Resp 18 | Ht 67.0 in | Wt 145.0 lb

## 2020-10-28 DIAGNOSIS — Z9889 Other specified postprocedural states: Secondary | ICD-10-CM | POA: Insufficient documentation

## 2020-10-28 DIAGNOSIS — E1122 Type 2 diabetes mellitus with diabetic chronic kidney disease: Secondary | ICD-10-CM | POA: Diagnosis not present

## 2020-10-28 DIAGNOSIS — Z720 Tobacco use: Secondary | ICD-10-CM

## 2020-10-28 DIAGNOSIS — Z888 Allergy status to other drugs, medicaments and biological substances status: Secondary | ICD-10-CM | POA: Diagnosis not present

## 2020-10-28 DIAGNOSIS — F17210 Nicotine dependence, cigarettes, uncomplicated: Secondary | ICD-10-CM | POA: Diagnosis not present

## 2020-10-28 DIAGNOSIS — I10 Essential (primary) hypertension: Secondary | ICD-10-CM

## 2020-10-28 DIAGNOSIS — Z881 Allergy status to other antibiotic agents status: Secondary | ICD-10-CM | POA: Diagnosis not present

## 2020-10-28 DIAGNOSIS — Z885 Allergy status to narcotic agent status: Secondary | ICD-10-CM | POA: Insufficient documentation

## 2020-10-28 DIAGNOSIS — E1169 Type 2 diabetes mellitus with other specified complication: Secondary | ICD-10-CM

## 2020-10-28 DIAGNOSIS — Z79899 Other long term (current) drug therapy: Secondary | ICD-10-CM | POA: Diagnosis not present

## 2020-10-28 DIAGNOSIS — N189 Chronic kidney disease, unspecified: Secondary | ICD-10-CM | POA: Insufficient documentation

## 2020-10-28 DIAGNOSIS — Z7984 Long term (current) use of oral hypoglycemic drugs: Secondary | ICD-10-CM | POA: Diagnosis not present

## 2020-10-28 DIAGNOSIS — I509 Heart failure, unspecified: Secondary | ICD-10-CM

## 2020-10-28 DIAGNOSIS — I130 Hypertensive heart and chronic kidney disease with heart failure and stage 1 through stage 4 chronic kidney disease, or unspecified chronic kidney disease: Secondary | ICD-10-CM | POA: Insufficient documentation

## 2020-10-28 DIAGNOSIS — K4020 Bilateral inguinal hernia, without obstruction or gangrene, not specified as recurrent: Secondary | ICD-10-CM

## 2020-10-28 MED ORDER — LOSARTAN POTASSIUM 25 MG PO TABS: 25.0000 mg | ORAL_TABLET | Freq: Every day | ORAL | 5 refills | Status: DC

## 2020-10-28 MED ORDER — FUROSEMIDE 20 MG PO TABS: 20.0000 mg | ORAL_TABLET | Freq: Every day | ORAL | 5 refills | Status: DC

## 2020-10-28 MED ORDER — POTASSIUM CHLORIDE ER 10 MEQ PO TBCR: 20.0000 meq | EXTENDED_RELEASE_TABLET | Freq: Every day | ORAL | 5 refills | Status: DC

## 2020-10-28 NOTE — Patient Instructions (Signed)
Continue weighing daily and call for an overnight weight gain of > 2 pounds or a weekly weight gain of >5 pounds. 

## 2020-10-28 NOTE — Progress Notes (Signed)
Patient ID: Kyle Ramsey, male    DOB: 10-22-1955, 65 y.o.   MRN: [id number]  HPI  Kyle Ramsey is a 65 y/o male with a history of DM, CKD, HTN, current tobacco use and chronic heart failure.   No echo for review.  Was in the ED 10/25/20 due to leg swelling. CT scans negative for PE/ DVT or venous thrombosis in his abdomen. Given lasix/ potassium and released.    He presents today for his initial visit with a chief complaint of minimal shortness of breath upon moderate exertion. He describes this as having been present for several months. He has associated pedal edema along with this. He denies any difficulty sleeping, abdominal distention, palpitations, chest pain, dizziness, fatigue or weight gain.   Says that he just started the furosemide 2 days ago as there was a delay in getting it picked up. Feels like the potassium tablets make him nauseous even when taking them with food.   Has multiple allergies listed and one of them is valsartan. When asked about his reaction he says that he really doesn't know because he said 2 medications were started at one time, he started to not feel good and just stopped taking both of them.   Takes varying doses of gabapentin depending on how much tingling/ pain he has in his legs.   Past Medical History:  Diagnosis Date  . Diabetes mellitus ([place])   . Kidney calculi    No past surgical history on file. Family History  Problem Relation Age of Onset  . Diabetes Mother   . Dementia Mother   . Breast cancer Mother   . Diabetes Father   . Dementia Father    Social History   Tobacco Use  . Smoking status: Current Every Day Smoker    Packs/day: 0.50    Years: 40.00    Pack years: 20.00    Types: Cigarettes  . Smokeless tobacco: Never Used  . Tobacco comment: 1 ppd 40 years, cut back to 0.5 ppd  Substance Use Topics  . Alcohol use: Not Currently    Comment: nothing    Allergies  Allergen Reactions  . Azithromycin Other (See Comments)  .  Codeine Nausea And Vomiting  . Erythromycin Other (See Comments)    Gall bladder problem  . Glipizide Other (See Comments)  . Metformin Hcl Er Other (See Comments)  . Onion Other (See Comments)  . Valsartan Other (See Comments)   Prior to Admission medications   Medication Sig Start Date End Date Taking? Authorizing Provider  furosemide (LASIX) 20 MG tablet Take 1 tablet (20 mg total) by mouth daily for 7 days. 10/25/20 11/01/20 Yes Kyle , MD  gabapentin (NEURONTIN) 100 MG capsule Take 1 tablet four times daily, along with 300mg  tablet = 400mg  four times daily. 07/15/20  Yes Ramsey, Kyle K, DO  gabapentin (NEURONTIN) 300 MG capsule TAKE 2 CAPSULES (600 MG) AT 7AM, 2 CAPS AT 11AM, 2 CAPS AT 3PM & 3 CAPSULES (900 MG) AT BEDTIME 10/14/20  Yes Ramsey, Kyle K, DO  hyoscyamine (ANASPAZ) 0.125 MG TBDP disintergrating tablet Take by mouth as needed. 08/29/20  Yes [provider]  ibuprofen (ADVIL) 600 MG tablet Take 1 tablet (600 mg total) by mouth every 6 (six) hours as needed. 06/14/19  Yes Ramsey, Kyle Galas, MD  nystatin ointment (MYCOSTATIN) Apply topically as needed. 08/01/20  Yes [provider]  potassium chloride (KLOR-CON) 10 MEQ tablet Take 2 tablets (20 mEq total) by mouth  daily for 7 days. 10/25/20 11/01/20 Yes Kyle Martin, MD  dapagliflozin propanediol (FARXIGA) 5 MG TABS tablet Take 1 tablet (5 mg total) by mouth daily before breakfast. Patient not taking: Reported on 10/28/2020 10/11/20   Kyle Noe, MD  diphenoxylate-atropine (LOMOTIL) 2.5-0.025 MG tablet Take 1 tablet by mouth 4 (four) times daily. Patient not taking: Reported on 10/28/2020 07/09/20   [provider]    Review of Systems  Constitutional: Negative for appetite change and fatigue.  HENT: Positive for hearing loss. Negative for congestion, postnasal drip and sore throat.   Eyes: Negative.   Respiratory: Positive for shortness of breath (with moderate exertion). Negative for chest  tightness.   Cardiovascular: Positive for leg swelling. Negative for chest pain and palpitations.  Gastrointestinal: Negative for abdominal distention and abdominal pain.  Endocrine: Negative.   Genitourinary: Negative.   Musculoskeletal: Positive for arthralgias (both legs). Negative for back pain.  Allergic/Immunologic: Negative.   Neurological: Negative for dizziness and light-headedness.       "off balance"  Hematological: Negative for adenopathy. Does not bruise/bleed easily.  Psychiatric/Behavioral: Negative for agitation and sleep disturbance. The patient is nervous/anxious.    Vitals:   10/28/20 1402  BP: (!) 163/101  Pulse: 85  Resp: 18  SpO2: 100%  Weight: 145 lb (65.8 kg)  Height: 5\' 7"  (1.702 m)   Wt Readings from Last 3 Encounters:  10/28/20 145 lb (65.8 kg)  10/25/20 140 lb (63.5 kg)  09/08/20 131 lb 12 oz (59.8 kg)   Lab Results  Component Value Date   CREATININE 0.95 10/25/2020   CREATININE 0.90 09/08/2020   CREATININE 1.00 04/08/2020    Physical Exam Vitals and nursing note reviewed. Exam conducted with a chaperone present (DIL).  Constitutional:      Appearance: Normal appearance.  HENT:     Head: Normocephalic and atraumatic.     Right Ear: Decreased hearing noted.     Left Ear: Decreased hearing noted.  Cardiovascular:     Rate and Rhythm: Normal rate and regular rhythm.  Pulmonary:     Effort: Pulmonary effort is normal. No respiratory distress.     Breath sounds: No wheezing or rales.  Abdominal:     General: There is no distension.     Palpations: Abdomen is soft.  Musculoskeletal:        General: No tenderness.     Cervical back: Normal range of motion and neck supple.     Right lower leg: Edema (1+ pitting) present.     Left lower leg: Edema (1+ pitting) present.  Skin:    General: Skin is warm and dry.  Neurological:     General: No focal deficit present.     Mental Status: He is alert and oriented to person, place, and time.   Psychiatric:        Mood and Affect: Mood normal.        Behavior: Behavior normal.   Assessment & Plan:  1: Possible heart failure with unknown ejection fraction- - NYHA class II - euvolemic today - weighing daily and says that his weight has been stable since he started furosemide 2 days ago; 30 day RX sent to [facility] in [place] - not adding salt but has recently eaten salty foods (2 hot dogs with mustard and slaw); reviewed the importance of decreasing sodium consumption and written dietary information was given to him about this - offered to change potassium to 83meq tablet daily but he defers to keep  taking 2 of the 80meq tablets daily; 30 day RX sent in - scheduled echo on 11/14/20 - will start losartan 25mg  daily and change it depending on echo results - will check BMP next week - BNP 10/25/20 was 512.3  2: HTN- - BP elevated (163/102) and still elevated after recheck (158/92) - starting losartan per above - saw PCP Einar Pheasant) 09/08/20 - BMP 10/25/20 reviewed and showed sodium 141, potassium 2.9, creatinine 0.95 and GFR >60  3: DM- - A1c 09/08/20 was 7.3%   4: Tobacco use- - current tobacco use of 1/2 ppd - cessation discussed for 3 minutes - no longer drinking alcohol  5: Hernia repair- - saw surgeon post operatively on 10/18/20 - to resume wearing scrotal support per surgeon  Patient did not bring his medications nor a list. Each medication was verbally reviewed with the patient and he was encouraged to bring the bottles to every visit to confirm accuracy of list.  Return in 1 week or sooner for any questions/problems before then.

## 2020-10-31 ENCOUNTER — Other Ambulatory Visit: Payer: Self-pay | Admitting: Family

## 2020-10-31 ENCOUNTER — Telehealth: Payer: Self-pay | Admitting: Family

## 2020-10-31 ENCOUNTER — Other Ambulatory Visit: Payer: Self-pay

## 2020-10-31 ENCOUNTER — Ambulatory Visit
Admission: RE | Admit: 2020-10-31 | Discharge: 2020-10-31 | Disposition: A | Discharge status: Home / Self Care | Payer: Medicare Other | Source: Ambulatory Visit | Attending: Family | Admitting: Family

## 2020-10-31 DIAGNOSIS — I509 Heart failure, unspecified: Secondary | ICD-10-CM | POA: Diagnosis not present

## 2020-10-31 LAB — BASIC METABOLIC PANEL
Anion gap: 7 (ref 5–15)
BUN: 13 mg/dL (ref 8–23)
CO2: 28 mmol/L (ref 22–32)
Calcium: 8.5 mg/dL — ABNORMAL LOW (ref 8.9–10.3)
Chloride: 104 mmol/L (ref 98–111)
Creatinine, Ser: 0.89 mg/dL (ref 0.61–1.24)
GFR, Estimated: >60 mL/min (ref >60)
Glucose, Bld: 137 mg/dL — ABNORMAL HIGH (ref 70–99)
Potassium: 3.7 mmol/L (ref 3.5–5.1)
Sodium: 139 mmol/L (ref 135–145)

## 2020-10-31 LAB — BRAIN NATRIURETIC PEPTIDE: B Natriuretic Peptide: 459 pg/mL — ABNORMAL HIGH (ref 0.0–100.0)

## 2020-10-31 MED ORDER — POTASSIUM CHLORIDE CRYS ER 20 MEQ PO TBCR: 40.0000 meq | EXTENDED_RELEASE_TABLET | Freq: Once | ORAL | Status: AC

## 2020-10-31 MED ORDER — FUROSEMIDE 10 MG/ML IJ SOLN
INTRAMUSCULAR | Status: AC
  Administered 2020-10-31: 80 mg via INTRAVENOUS
  Filled 2020-10-31: qty 8

## 2020-10-31 MED ORDER — POTASSIUM CHLORIDE CRYS ER 20 MEQ PO TBCR
EXTENDED_RELEASE_TABLET | ORAL | Status: AC
  Administered 2020-10-31: 40 meq via ORAL
  Filled 2020-10-31: qty 2

## 2020-10-31 MED ORDER — FUROSEMIDE 10 MG/ML IJ SOLN: 80.0000 mg | Freq: Once | INTRAMUSCULAR | Status: AC

## 2020-10-31 NOTE — Telephone Encounter (Signed)
Patient's DIL, Loma Sousa, called to say that patient had called her stating that he had gotten acutely short of breath last night (which has since resolved) and that his legs remain quite swollen even with taking the furosemide. Says that his weight is fluctuating 1-2 pounds daily.   Discussed giving IV lasix outpatient to assist with fluid removal without having to send him to the ED.   Have scheduled patient to have this done between noon-1pm today.   Tory Emerald that we can always repeat this tomorrow if needed and if his lab work looks ok. Should his symptoms worsen, he can always present to the ED.

## 2020-11-01 ENCOUNTER — Telehealth: Payer: Self-pay | Admitting: Family

## 2020-11-01 MED ORDER — FUROSEMIDE 20 MG PO TABS
40.0000 mg | ORAL_TABLET | Freq: Every day | ORAL | 5 refills | Status: DC
Start: 2020-11-01 — End: 2020-11-03

## 2020-11-01 NOTE — Telephone Encounter (Signed)
Received phone call from patient after he received 80mg  IV lasix/ 40mg  PO potassium yesterday. He says that he lost 5 pounds overnight and his swelling in his legs is a little better as well. He also says that his shortness of breath has also improved slightly.   Offered to repeat the IV lasix today but he declines saying that he's a difficult stick and it took a few times to obtain and keep access yesterday. Advised him to increase his furosemide to 40mg  daily. He will take 2 of his 20mg  tablets daily along with 2 of his potassium tablets daily.   Will check lab work at his next appointment in 2 days. Patient verbalized understanding and is agreeable to this plan.

## 2020-11-02 ENCOUNTER — Ambulatory Visit: Payer: Medicare Other | Admitting: Family

## 2020-11-03 ENCOUNTER — Ambulatory Visit: Payer: Medicare Other | Admitting: Family

## 2020-11-03 ENCOUNTER — Other Ambulatory Visit
Admission: RE | Admit: 2020-11-03 | Discharge: 2020-11-03 | Disposition: A | Discharge status: Home / Self Care | Payer: Medicare Other | Attending: Family | Admitting: Family

## 2020-11-03 ENCOUNTER — Encounter: Payer: Self-pay | Admitting: Family

## 2020-11-03 ENCOUNTER — Other Ambulatory Visit: Payer: Self-pay

## 2020-11-03 VITALS — BP 166/95 | HR 92 | Resp 18 | Ht 67.0 in | Wt 145.4 lb

## 2020-11-03 DIAGNOSIS — Z833 Family history of diabetes mellitus: Secondary | ICD-10-CM | POA: Insufficient documentation

## 2020-11-03 DIAGNOSIS — R0602 Shortness of breath: Secondary | ICD-10-CM | POA: Insufficient documentation

## 2020-11-03 DIAGNOSIS — Z01812 Encounter for preprocedural laboratory examination: Secondary | ICD-10-CM | POA: Diagnosis not present

## 2020-11-03 DIAGNOSIS — F17210 Nicotine dependence, cigarettes, uncomplicated: Secondary | ICD-10-CM | POA: Insufficient documentation

## 2020-11-03 DIAGNOSIS — N189 Chronic kidney disease, unspecified: Secondary | ICD-10-CM | POA: Insufficient documentation

## 2020-11-03 DIAGNOSIS — E1169 Type 2 diabetes mellitus with other specified complication: Secondary | ICD-10-CM

## 2020-11-03 DIAGNOSIS — F419 Anxiety disorder, unspecified: Secondary | ICD-10-CM | POA: Insufficient documentation

## 2020-11-03 DIAGNOSIS — Z79899 Other long term (current) drug therapy: Secondary | ICD-10-CM | POA: Insufficient documentation

## 2020-11-03 DIAGNOSIS — Z881 Allergy status to other antibiotic agents status: Secondary | ICD-10-CM | POA: Insufficient documentation

## 2020-11-03 DIAGNOSIS — R609 Edema, unspecified: Secondary | ICD-10-CM | POA: Insufficient documentation

## 2020-11-03 DIAGNOSIS — I130 Hypertensive heart and chronic kidney disease with heart failure and stage 1 through stage 4 chronic kidney disease, or unspecified chronic kidney disease: Secondary | ICD-10-CM | POA: Insufficient documentation

## 2020-11-03 DIAGNOSIS — Z885 Allergy status to narcotic agent status: Secondary | ICD-10-CM | POA: Insufficient documentation

## 2020-11-03 DIAGNOSIS — E1122 Type 2 diabetes mellitus with diabetic chronic kidney disease: Secondary | ICD-10-CM | POA: Insufficient documentation

## 2020-11-03 DIAGNOSIS — E1140 Type 2 diabetes mellitus with diabetic neuropathy, unspecified: Secondary | ICD-10-CM | POA: Insufficient documentation

## 2020-11-03 DIAGNOSIS — Z713 Dietary counseling and surveillance: Secondary | ICD-10-CM | POA: Insufficient documentation

## 2020-11-03 DIAGNOSIS — Z720 Tobacco use: Secondary | ICD-10-CM

## 2020-11-03 DIAGNOSIS — K4020 Bilateral inguinal hernia, without obstruction or gangrene, not specified as recurrent: Secondary | ICD-10-CM

## 2020-11-03 DIAGNOSIS — I509 Heart failure, unspecified: Secondary | ICD-10-CM

## 2020-11-03 DIAGNOSIS — Z791 Long term (current) use of non-steroidal anti-inflammatories (NSAID): Secondary | ICD-10-CM | POA: Insufficient documentation

## 2020-11-03 DIAGNOSIS — I10 Essential (primary) hypertension: Secondary | ICD-10-CM

## 2020-11-03 LAB — BASIC METABOLIC PANEL
Anion gap: 8 (ref 5–15)
BUN: 18 mg/dL (ref 8–23)
CO2: 30 mmol/L (ref 22–32)
Calcium: 8.8 mg/dL — ABNORMAL LOW (ref 8.9–10.3)
Chloride: 100 mmol/L (ref 98–111)
Creatinine, Ser: 1.08 mg/dL (ref 0.61–1.24)
GFR, Estimated: >60 mL/min (ref >60)
Glucose, Bld: 164 mg/dL — ABNORMAL HIGH (ref 70–99)
Potassium: 4.5 mmol/L (ref 3.5–5.1)
Sodium: 138 mmol/L (ref 135–145)

## 2020-11-03 MED ORDER — FUROSEMIDE 40 MG PO TABS: 40.0000 mg | ORAL_TABLET | Freq: Two times a day (BID) | ORAL | 3 refills | Status: DC

## 2020-11-03 NOTE — Progress Notes (Signed)
Patient ID: Kyle Ramsey, male    DOB: [birth year], 65 y.o.   MRN: [id number]  HPI  Kyle Ramsey is a 65 y/o male with a history of DM, CKD, HTN, current tobacco use and chronic heart failure.   No echo for review.  Was in the ED 10/25/20 due to leg swelling. CT scans negative for PE/ DVT or venous thrombosis in his abdomen. Given lasix/ potassium and released.    He presents today for a follow-up visit with a chief complaint of minimal shortness of breath upon moderate exertion. He describes this as having been present for several weeks with varying levels of severity. He has associated pedal edema, anxiety and neuropathy pain along with this. He denies any difficulty sleeping, dizziness, abdominal distention, palpitations, chest pain, cough, fatigue or weight gain.   Received 80mg  IV lasix/ 92meq PO potassium earlier this week with subsequent increase of his daily furosemide to 40mg  daily. Says that he feels like the swelling has improved some.   Took the losartan and says that he felt like he was getting more short of breath so he stopped taking it and his breathing improved. Today I've added losartan to his allergy list.   He says that he doesn't feel too bad except for his neuropathy pain that is present along both shoulders. Said for some reason today, he's "really hurting".   Past Medical History:  Diagnosis Date  . CHF (congestive heart failure) ([place])   . Diabetes mellitus ([place])   . Hypertension   . Kidney calculi    No past surgical history on file. Family History  Problem Relation Age of Onset  . Diabetes Mother   . Dementia Mother   . Breast cancer Mother   . Diabetes Father   . Dementia Father    Social History   Tobacco Use  . Smoking status: Current Every Day Smoker    Packs/day: 0.50    Years: 40.00    Pack years: 20.00    Types: Cigarettes  . Smokeless tobacco: Never Used  . Tobacco comment: 1 ppd 40 years, cut back to 0.5 ppd  Substance Use Topics  .  Alcohol use: Not Currently    Comment: nothing    Allergies  Allergen Reactions  . Azithromycin Other (See Comments)  . Codeine Nausea And Vomiting  . Erythromycin Other (See Comments)    Gall bladder problem  . Glipizide Other (See Comments)  . Metformin Hcl Er Other (See Comments)  . Onion Other (See Comments)   Prior to Admission medications   Medication Sig Start Date End Date Taking? Authorizing Provider  furosemide (LASIX) 20 MG tablet Take 2 tablets (40 mg total) by mouth daily. 11/01/20 12/01/20 Yes Darylene Price A, FNP  gabapentin (NEURONTIN) 100 MG capsule Take 1 tablet four times daily, along with 300mg  tablet = 400mg  four times daily. 07/15/20  Yes Patel, Donika K, DO  gabapentin (NEURONTIN) 300 MG capsule TAKE 2 CAPSULES (600 MG) AT 7AM, 2 CAPS AT 11AM, 2 CAPS AT 3PM & 3 CAPSULES (900 MG) AT BEDTIME 10/14/20  Yes Patel, Donika K, DO  hyoscyamine (ANASPAZ) 0.125 MG TBDP disintergrating tablet Take by mouth as needed. 08/29/20  Yes [provider]  ibuprofen (ADVIL) 600 MG tablet Take 1 tablet (600 mg total) by mouth every 6 (six) hours as needed. 06/14/19  Yes Lamptey, Myrene Galas, MD  losartan (COZAAR) 25 MG tablet Take 1 tablet (25 mg total) by mouth daily.  Patient reports as not taking  11/03/2020 10/28/20 01/26/21 Yes Darylene Price A, FNP  nystatin ointment (MYCOSTATIN) Apply topically as needed. 08/01/20  Yes [provider]  potassium chloride (KLOR-CON) 10 MEQ tablet Take 2 tablets (20 mEq total) by mouth daily. 10/28/20 11/27/20 Yes Alisa Graff, FNP     Review of Systems  Constitutional: Negative for appetite change and fatigue.  HENT: Positive for hearing loss. Negative for congestion, postnasal drip and sore throat.   Eyes: Negative.   Respiratory: Positive for shortness of breath (with moderate exertion). Negative for chest tightness.   Cardiovascular: Positive for leg swelling. Negative for chest pain and palpitations.  Gastrointestinal: Negative for  abdominal distention and abdominal pain.  Endocrine: Negative.   Genitourinary: Negative.   Musculoskeletal: Positive for arthralgias (both legs). Negative for back pain.  Allergic/Immunologic: Negative.   Neurological: Negative for dizziness and light-headedness.       "off balance"  Hematological: Negative for adenopathy. Does not bruise/bleed easily.  Psychiatric/Behavioral: Negative for agitation and sleep disturbance. The patient is nervous/anxious.    Vitals:   11/03/20 1258  BP: (!) 166/95  Pulse: 92  Resp: 18  SpO2: 100%  Weight: 145 lb 6 oz (65.9 kg)  Height: 5\' 7"  (1.702 m)   Wt Readings from Last 3 Encounters:  11/03/20 145 lb 6 oz (65.9 kg)  10/28/20 145 lb (65.8 kg)  10/25/20 140 lb (63.5 kg)   Lab Results  Component Value Date   CREATININE 1.08 11/03/2020   CREATININE 0.89 10/31/2020   CREATININE 0.95 10/25/2020    Physical Exam Vitals and nursing note reviewed. Exam conducted with a chaperone present (DIL).  Constitutional:      Appearance: Normal appearance.  HENT:     Head: Normocephalic and atraumatic.     Right Ear: Decreased hearing noted.     Left Ear: Decreased hearing noted.  Cardiovascular:     Rate and Rhythm: Normal rate and regular rhythm.  Pulmonary:     Effort: Pulmonary effort is normal. No respiratory distress.     Breath sounds: No wheezing or rales.  Abdominal:     General: There is no distension.     Palpations: Abdomen is soft.  Musculoskeletal:        General: No tenderness.     Cervical back: Normal range of motion and neck supple.     Right lower leg: Edema (1+ pitting) present.     Left lower leg: Edema (1+ pitting) present.  Skin:    General: Skin is warm and dry.  Neurological:     General: No focal deficit present.     Mental Status: He is alert and oriented to person, place, and time.  Psychiatric:        Mood and Affect: Mood normal.        Behavior: Behavior normal.   Assessment & Plan:  1: Possible heart  failure with unknown ejection fraction- - NYHA class II - euvolemic today - weighing daily and says that his home weight is down from 145 to 139 pounds; reminded to call for an overnight weight gain of > 2 pounds or a weekly weight gain of > 5 pounds - weight stable from last visit here 1 week ago - received 80mg  IV lasix/ 75meq PO potassium earlier this week with increase of daily furosemide to 40mg  daily - not adding salt  - has echo on 11/14/20 - losartan 25mg  daily started at last visit but he stopped it because he felt more short of breath with  taking it; this was added today as an allergy - will check BMP today; as long as labs look good, will plan on increasing diuretic to 40mg  BID - emphasized that he needed to get compression socks and put them on every morning with removal at bedtime - BNP 10/31/20 was 459.0  2: HTN- - BP elevated but he reports being in a lot of pain; increasing diuretic per above - saw PCP Einar Pheasant) 09/08/20 - BMP 10/31/20 reviewed and showed sodium 139, potassium 3.7, creatinine 0.89 and GFR >60  3: DM- - A1c 09/08/20 was 7.3%   4: Tobacco use- - current tobacco use of 1/2 ppd - cessation discussed for 3 minutes - no longer drinking alcohol  5: Hernia repair- - saw surgeon post operatively on 10/18/20 - to resume wearing scrotal support per surgeon   Patient did not bring his medications nor a list. Each medication was verbally reviewed with the patient and he was encouraged to bring the bottles to every visit to confirm accuracy of list.  Return in 3 weeks or sooner for any questions/problems before then.

## 2020-11-03 NOTE — Patient Instructions (Addendum)
Continue weighing daily and call for an overnight weight gain of > 2 pounds or a weekly weight gain of >5 pounds.   Increase furosemide (lasix) to 40mg  twice a day

## 2020-11-09 NOTE — Chronic Care Management (AMB) (Addendum)
Contacted [employer]  to follow up on patient assistance application for [place]. Per representative at [employer] they have not received any portion of the application.    Follow-Up:  Patient Assistance Coordination and Pharmacist Review  Debbora Dus, CPP notified  Margaretmary Dys, [facility] Assistant [phone]

## 2020-11-14 ENCOUNTER — Ambulatory Visit: Admission: RE | Admit: 2020-11-14 | Payer: Medicare Other | Source: Ambulatory Visit

## 2020-11-15 ENCOUNTER — Telehealth: Payer: Self-pay

## 2020-11-15 NOTE — Chronic Care Management (AMB) (Addendum)
Chronic Care Management Pharmacy Assistant   Name: Kyle Ramsey  MRN: [id number] DOB: 1955/11/05  Reason for Encounter: Disease State- Diabetes and Patient Assistance Follow Up    Conditions to be addressed/monitored: DMII  Recent office visits:  None since last CCM visit  Recent consult visits:  11/03/20- Kyle Ramsey- Cardiology- discontinued losartan due to side effects. Increased Furosemide from 40 mg daily to 40 mg BID.  10/18/20- [name] surgery  Hospital visits:   10/25/20- ED visit for acute heart failure. Not admitted  None in previous 6 months  Medications: Outpatient Encounter Medications as of 11/15/2020  Medication Sig   dapagliflozin propanediol (FARXIGA) 5 MG TABS tablet Take 1 tablet (5 mg total) by mouth daily before breakfast.   diphenoxylate-atropine (LOMOTIL) 2.5-0.025 MG tablet Take 1 tablet by mouth 4 (four) times daily.   furosemide (LASIX) 40 MG tablet Take 1 tablet (40 mg total) by mouth 2 (two) times daily.   gabapentin (NEURONTIN) 100 MG capsule Take 1 tablet four times daily, along with 300mg  tablet = 400mg  four times daily.   gabapentin (NEURONTIN) 300 MG capsule TAKE 2 CAPSULES (600 MG) AT 7AM, 2 CAPS AT 11AM, 2 CAPS AT 3PM & 3 CAPSULES (900 MG) AT BEDTIME   hyoscyamine (ANASPAZ) 0.125 MG TBDP disintergrating tablet Take by mouth as needed.   ibuprofen (ADVIL) 600 MG tablet Take 1 tablet (600 mg total) by mouth every 6 (six) hours as needed.   nystatin ointment (MYCOSTATIN) Apply topically as needed.   potassium chloride (KLOR-CON) 10 MEQ tablet Take 2 tablets (20 mEq total) by mouth daily.   No facility-administered encounter medications on file as of 11/15/2020.     Recent Relevant Labs: Lab Results  Component Value Date/Time   HGBA1C 7.3 (A) 09/08/2020 11:58 AM    Kidney Function Lab Results  Component Value Date/Time   CREATININE 1.08 11/03/2020 01:50 PM   CREATININE 0.89 10/31/2020 01:12 PM   CREATININE 1.13  11/15/2011 01:25 PM   GFR 89.52 09/08/2020 12:22 PM   GFRNONAA >60 11/03/2020 01:50 PM    Current antihyperglycemic regimen:  Glimepiride 2 mg - 1 tablet daily (not taking) Farxiga 5 mg daily - states it kept him awake and states it caused some other problems. Stopped taking after 2 days   Patient verbally confirms he is taking the above medications as directed. No  What recent interventions/DTPs have been made to improve glycemic control:  Started Farxiga  Have there been any recent hospitalizations or ED visits since last visit with CPP? Yes  Patient denies hypoglycemic symptoms, including Pale, Sweaty, Shaky, Hungry, Nervous/irritable and Vision changes  Patient denies hyperglycemic symptoms, including blurry vision, excessive thirst, fatigue, polyuria and weakness  How often are you checking your blood sugar? twice daily  What are your blood sugars ranging?  Fasting: Ranges 120-160 Before meals: N/A After meals: 180-190, sometimes around 230-250 Bedtime: N/A  On insulin? No  During the week, how often does your blood glucose drop below 70? Never  Are you checking your feet daily/regularly? Yes- states feet look ok.   Adherence Review: Is the patient currently on a STATIN medication? No Is the patient currently on ACE/ARB medication? No  Star Rating Drugs:  Medication:  Last Fill: Day Supply None identified in chart   States when his blood sugar is over 200 his nerve pain gets worse around his chest and back. He notes his stomach bothers him and he states this has been ongoing since he  had a scope in September. States he tried mowing the yard with riding mower but it increased his pain for 2 days. He has not been able to take the [place] due to it keeping him awake at night. He notes he is trying to control his blood sugar by diet. He would like to try something else for his diabetes that does not cause diarrhea.  Follow-Up:  Pharmacist Review  Debbora Dus, CPP  notified  Margaretmary Dys, [place] Assistant [phone]  I have reviewed the care management and care coordination activities outlined in this encounter and I am certifying that I agree with the content of this note. Scheduled CCM visit to discuss options.  Debbora Dus, PharmD Clinical Pharmacist [facility] [phone]

## 2020-11-23 ENCOUNTER — Ambulatory Visit: Payer: Medicare Other | Admitting: Family

## 2020-11-25 ENCOUNTER — Ambulatory Visit
Admission: RE | Admit: 2020-11-25 | Discharge: 2020-11-25 | Disposition: A | Discharge status: Home / Self Care | Payer: Medicare Other | Source: Ambulatory Visit | Attending: Family | Admitting: Family

## 2020-11-25 ENCOUNTER — Other Ambulatory Visit: Payer: Self-pay

## 2020-11-25 DIAGNOSIS — I088 Other rheumatic multiple valve diseases: Secondary | ICD-10-CM | POA: Diagnosis not present

## 2020-11-25 DIAGNOSIS — I509 Heart failure, unspecified: Secondary | ICD-10-CM | POA: Insufficient documentation

## 2020-11-25 DIAGNOSIS — I5021 Acute systolic (congestive) heart failure: Secondary | ICD-10-CM | POA: Diagnosis not present

## 2020-11-25 NOTE — Progress Notes (Signed)
*  PRELIMINARY RESULTS* Echocardiogram 2D Echocardiogram has been performed.  Kyle Ramsey 11/25/2020, 11:44 AM

## 2020-11-26 LAB — ECHOCARDIOGRAM COMPLETE
AR max vel: 2.31 cm2
AV Area VTI: 2.34 cm2
AV Area mean vel: 2.53 cm2
AV Mean grad: 2 mmHg
AV Peak grad: 4.2 mmHg
Ao pk vel: 1.03 m/s
Area-P 1/2: 4.52 cm2
MV VTI: 2.8 cm2
S' Lateral: 3.9 cm

## 2020-12-09 ENCOUNTER — Ambulatory Visit: Payer: Medicare Other | Attending: Family | Admitting: Family

## 2020-12-09 ENCOUNTER — Encounter: Payer: Self-pay | Admitting: Family

## 2020-12-09 ENCOUNTER — Other Ambulatory Visit: Payer: Self-pay

## 2020-12-09 VITALS — BP 154/86 | HR 82 | Resp 18 | Ht 67.0 in | Wt 141.0 lb

## 2020-12-09 DIAGNOSIS — I5022 Chronic systolic (congestive) heart failure: Secondary | ICD-10-CM | POA: Insufficient documentation

## 2020-12-09 DIAGNOSIS — F17210 Nicotine dependence, cigarettes, uncomplicated: Secondary | ICD-10-CM | POA: Diagnosis not present

## 2020-12-09 DIAGNOSIS — R0602 Shortness of breath: Secondary | ICD-10-CM | POA: Insufficient documentation

## 2020-12-09 DIAGNOSIS — Z7984 Long term (current) use of oral hypoglycemic drugs: Secondary | ICD-10-CM | POA: Diagnosis not present

## 2020-12-09 DIAGNOSIS — E1169 Type 2 diabetes mellitus with other specified complication: Secondary | ICD-10-CM

## 2020-12-09 DIAGNOSIS — G8929 Other chronic pain: Secondary | ICD-10-CM | POA: Diagnosis not present

## 2020-12-09 DIAGNOSIS — N189 Chronic kidney disease, unspecified: Secondary | ICD-10-CM | POA: Insufficient documentation

## 2020-12-09 DIAGNOSIS — Z79899 Other long term (current) drug therapy: Secondary | ICD-10-CM | POA: Diagnosis not present

## 2020-12-09 DIAGNOSIS — E1122 Type 2 diabetes mellitus with diabetic chronic kidney disease: Secondary | ICD-10-CM | POA: Insufficient documentation

## 2020-12-09 DIAGNOSIS — Z7952 Long term (current) use of systemic steroids: Secondary | ICD-10-CM | POA: Diagnosis not present

## 2020-12-09 DIAGNOSIS — I130 Hypertensive heart and chronic kidney disease with heart failure and stage 1 through stage 4 chronic kidney disease, or unspecified chronic kidney disease: Secondary | ICD-10-CM | POA: Diagnosis not present

## 2020-12-09 DIAGNOSIS — Z720 Tobacco use: Secondary | ICD-10-CM

## 2020-12-09 DIAGNOSIS — I10 Essential (primary) hypertension: Secondary | ICD-10-CM

## 2020-12-09 MED ORDER — SACUBITRIL-VALSARTAN 24-26 MG PO TABS: 1.0000 | ORAL_TABLET | Freq: Two times a day (BID) | ORAL | 3 refills | Status: DC

## 2020-12-09 NOTE — Patient Instructions (Addendum)
Continue weighing daily and call for an overnight weight gain of > 2 pounds or a weekly weight gain of >5 pounds.   Begin entresto as 1 tablet in the morning and 1 tablet in the afternoon.

## 2020-12-09 NOTE — Progress Notes (Signed)
Patient ID: Kyle Ramsey, male    DOB: [birth year], 65 y.o.   MRN: [id number]  HPI  Kyle Ramsey is Ramsey 65 y/o male with Ramsey history of DM, CKD, HTN, current tobacco use and chronic heart failure.   Echo report from 11/25/20 reviewed and showed an EF of 35-40% along with mild LAE and mild Kyle.   Was in the ED 10/25/20 due to leg swelling. CT scans negative for PE/ DVT or venous thrombosis in his abdomen. Given lasix/ potassium and released.    He presents today for Ramsey follow-up visit with Ramsey chief complaint of minimal shortness of breath upon moderate exertion. He describes this as chronic in nature having been present for several years. He has associated pedal edema at times, chronic pain and anxiety about his echo results along with this. He denies any difficulty sleeping, abdominal distention, palpitations, chest pain, wheezing, cough, fatigue, dizziness or weight gain.   Reports Ramsey lot of anxiety and fear about what his echo showed and is concerned that he only has months to live.   Past Medical History:  Diagnosis Date  . CHF (congestive heart failure) ([place])   . Diabetes mellitus ([place])   . Hypertension   . Kidney calculi    No past surgical history on file. Family History  Problem Relation Age of Onset  . Diabetes Mother   . Dementia Mother   . Breast cancer Mother   . Diabetes Father   . Dementia Father    Social History   Tobacco Use  . Smoking status: Current Every Day Smoker    Packs/day: 0.50    Years: 40.00    Pack years: 20.00    Types: Cigarettes  . Smokeless tobacco: Never Used  . Tobacco comment: 1 ppd 40 years, cut back to 0.5 ppd  Substance Use Topics  . Alcohol use: Not Currently    Comment: nothing    Allergies  Allergen Reactions  . Losartan Shortness Of Breath  . Azithromycin Other (See Comments)  . Codeine Nausea And Vomiting  . Erythromycin Other (See Comments)    Gall bladder problem  . Glipizide Other (See Comments)  . Metformin Hcl Er Other (See  Comments)  . Onion Other (See Comments)   Prior to Admission medications   Medication Sig Start Date End Date Taking? Authorizing Provider  budesonide (ENTOCORT EC) 3 MG 24 hr capsule Take 9 mg by mouth daily.   Yes Provider, Historical, Kyle Ramsey  cholecalciferol (VITAMIN D3) 25 MCG (1000 UNIT) tablet Take 2,000 Units by mouth daily.   Yes Provider, Historical, Kyle Ramsey  diphenoxylate-atropine (LOMOTIL) 2.5-0.025 MG tablet Take 1 tablet by mouth 4 (four) times daily. 07/09/20  Yes Provider, Historical, Kyle Ramsey  furosemide (LASIX) 40 MG tablet Take 1 tablet (40 mg total) by mouth 2 (two) times daily. 11/03/20  Yes Kyle Price Ramsey, Kyle Ramsey  gabapentin (NEURONTIN) 100 MG capsule Take 1 tablet four times daily, along with 300mg  tablet = 400mg  four times daily. 07/15/20  Yes Kyle Ramsey, Kyle Ramsey, Kyle Ramsey  gabapentin (NEURONTIN) 300 MG capsule TAKE 2 CAPSULES (600 MG) AT 7AM, 2 CAPS AT 11AM, 2 CAPS AT 3PM & 3 CAPSULES (900 MG) AT BEDTIME 10/14/20  Yes Kyle Ramsey, Kyle Ramsey, Kyle Ramsey  hyoscyamine (ANASPAZ) 0.125 MG TBDP disintergrating tablet Take by mouth as needed. 08/29/20  Yes Provider, Historical, Kyle Ramsey  ibuprofen (ADVIL) 600 MG tablet Take 1 tablet (600 mg total) by mouth every 6 (six) hours as needed. 06/14/19  Yes Kyle Ramsey, Kyle Galas, Kyle Ramsey  Multiple Vitamin (MULTIVITAMIN WITH MINERALS) TABS tablet Take 1 tablet by mouth daily.   Yes Provider, Historical, Kyle Ramsey  potassium chloride (KLOR-CON) 10 MEQ tablet Take 2 tablets (20 mEq total) by mouth daily. 10/28/20 11/27/20 Yes Kyle Graff, Kyle Ramsey  dapagliflozin propanediol (FARXIGA) 5 MG TABS tablet Take 1 tablet (5 mg total) by mouth daily before breakfast. Patient not taking: Reported on 12/09/2020 10/11/20   Kyle Noe, Kyle Ramsey  nystatin ointment (MYCOSTATIN) Apply topically as needed. Patient not taking: Reported on 12/09/2020 08/01/20   Provider, Historical, Kyle Ramsey     Review of Systems  Constitutional: Negative for appetite change and fatigue.  HENT: Positive for hearing loss. Negative for congestion,  postnasal drip and sore throat.   Eyes: Negative.   Respiratory: Positive for shortness of breath (with moderate exertion). Negative for cough, chest tightness and wheezing.   Cardiovascular: Positive for leg swelling (much better; resolves overnight). Negative for chest pain and palpitations.  Gastrointestinal: Negative for abdominal distention and abdominal pain.  Endocrine: Negative.   Genitourinary: Negative.   Musculoskeletal: Positive for arthralgias (both legs). Negative for back pain.  Allergic/Immunologic: Negative.   Neurological: Negative for dizziness and light-headedness.       "off balance"  Hematological: Negative for adenopathy. Does not bruise/bleed easily.  Psychiatric/Behavioral: Negative for agitation and sleep disturbance. The patient is nervous/anxious.    Vitals:   12/09/20 1226  BP: (!) 154/86  Pulse: 82  Resp: 18  SpO2: 100%  Weight: 141 lb (64 kg)  Height: 5\' 7"  (1.702 m)   Wt Readings from Last 3 Encounters:  12/09/20 141 lb (64 kg)  11/03/20 145 lb 6 oz (65.9 kg)  10/28/20 145 lb (65.8 kg)   Lab Results  Component Value Date   CREATININE 1.08 11/03/2020   CREATININE 0.89 10/31/2020   CREATININE 0.95 10/25/2020    Physical Exam Vitals and nursing note reviewed. Exam conducted with Ramsey chaperone present (DIL).  Constitutional:      Appearance: Normal appearance.  HENT:     Head: Normocephalic and atraumatic.     Right Ear: Decreased hearing noted.     Left Ear: Decreased hearing noted.  Cardiovascular:     Rate and Rhythm: Normal rate and regular rhythm.  Pulmonary:     Effort: Pulmonary effort is normal. No respiratory distress.     Breath sounds: No wheezing or rales.  Abdominal:     General: There is no distension.     Palpations: Abdomen is soft.  Musculoskeletal:        General: No tenderness or deformity.     Cervical back: Normal range of motion and neck supple.     Right lower leg: No edema.     Left lower leg: No edema.  Skin:     General: Skin is warm and dry.  Neurological:     General: No focal deficit present.     Mental Status: He is alert and oriented to person, place, and time.  Psychiatric:        Mood and Affect: Mood normal.        Behavior: Behavior normal.   Assessment & Plan:  1: Chronic heart failure with reduced ejection fraction- - NYHA class II - euvolemic today - weighing daily; reminded to call for an overnight weight gain of > 2 pounds or Ramsey weekly weight gain of > 5 pounds - weight down 4 pounds from last visit here 1 month ago - reviewed echo results with patient; discussed GDMT  of metoprolol succinate, entresto, spironolactone and SGLT - patient agreeable to trying entresto 24/26mg  BID; 30 day voucher given to patient; said that he got short of breath with losartan and didn't like how farxiga made him feel; says that he reads about all the potential side effects of medications - explained that if he starts to experience side effects to let me know - plan to Kyle Ramsey BMP next visit - not adding salt  - edema much improved - BNP 10/31/20 was 459.0 - pharmD reconciled medications with the patient  2: HTN- - BP elevated but he reports being quite anxious because he was very worried about his echo results - saw PCP Kyle Ramsey) 09/08/20 - BMP 11/03/20 reviewed and showed sodium 138, potassium 4.5, creatinine 1.08 and GFR >60  3: DM- - A1c 09/08/20 was 7.3%  - glucose at home today was 150  4: Tobacco use- - current tobacco use of 1/2 ppd - cessation discussed for 3 minutes - no longer drinking alcohol  Medication bottles reviewed.   Return in 2 weeks or sooner for any questions/problems before then.

## 2020-12-09 NOTE — Progress Notes (Signed)
[facility] - PHARMACIST COUNSELING NOTE  ADHERENCE ASSESSMENT   Do you ever forget to take your medication? [] Yes (1) [x] No (0)  Do you ever skip doses due to side effects? [] Yes (1) [x] No (0)  Do you have trouble affording your medicines? [] Yes (1) [x] No (0)  Are you ever unable to pick up your medication due to transportation difficulties? [] Yes (1) [x] No (0)  Do you ever stop taking your medications because you don't believe they are helping? [] Yes (1) [x] No (0)  Total score _0______     Guideline-Directed Medical Therapy/Evidence Based Medicine  ACE/ARB/ARNI: [place] Corporal Blocker: N/A Aldosterone Antagonist: N/A Diuretic: furosemide    SUBJECTIVE  HPI:  Past Medical History:  Diagnosis Date  . CHF (congestive heart failure) ([place])   . Diabetes mellitus ([place])   . Hypertension   . Kidney calculi         OBJECTIVE   Vital signs: HR 82, BP 154/86, weight 64 kg ECHO: Date 11/25/20, EF 35-40%, notes LA mildly dilated, no LVH  BMP Latest Ref Rng & Units 11/03/2020 10/31/2020 10/25/2020  Glucose 70 - 99 mg/dL 164(H) 137(H) 212(H)  BUN 8 - 23 mg/dL 18 13 12   Creatinine 0.61 - 1.24 mg/dL 1.08 0.89 0.95  Sodium 135 - 145 mmol/L 138 139 141  Potassium 3.5 - 5.1 mmol/L 4.5 3.7 2.9(L)  Chloride 98 - 111 mmol/L 100 104 101  CO2 22 - 32 mmol/L 30 28 33(H)  Calcium 8.9 - 10.3 mg/dL 8.8(L) 8.5(L) 8.5(L)    ASSESSMENT 65 yo M presenting to heart failure clinic for follow-up visit. PMH includes CHF, HTN, diabetes, and colitis. No barriers to adherence were identified during medication reconciliation.   PLAN CHF/HTN - Continue furosemide 40 mg twice daily and KCl 20 mEq daily  - Begin taking Entresto 24-26 mg twice daily   Colitis/Diarrhea - Continue hyoscyamine 0.125 mg as needed - Continue diphenoxylate-atropine four times daily as needed - Continue budesonide 9 mg daily   Pain - Begin using voltaren gel to reduce use of  ibuprofen  - Continue gabapentin 600 mg at 7 AM, 11 AM, 3 PM and 900 mg at bedtime  General Health - Continue vitamin D 2000 units daily  - Continue multivitamin daily   Time spent: 15 minutes  Benn Moulder, PharmD Pharmacy Resident  12/09/2020 1:18 PM   Current Outpatient Medications:  .  budesonide (ENTOCORT EC) 3 MG 24 hr capsule, Take 9 mg by mouth daily., Disp: , Rfl:  .  cholecalciferol (VITAMIN D3) 25 MCG (1000 UNIT) tablet, Take 2,000 Units by mouth daily., Disp: , Rfl:  .  dapagliflozin propanediol (FARXIGA) 5 MG TABS tablet, Take 1 tablet (5 mg total) by mouth daily before breakfast. (Patient not taking: Reported on 12/09/2020), Disp: 30 tablet, Rfl: 1 .  diphenoxylate-atropine (LOMOTIL) 2.5-0.025 MG tablet, Take 1 tablet by mouth 4 (four) times daily., Disp: , Rfl:  .  furosemide (LASIX) 40 MG tablet, Take 1 tablet (40 mg total) by mouth 2 (two) times daily., Disp: 60 tablet, Rfl: 3 .  gabapentin (NEURONTIN) 100 MG capsule, Take 1 tablet four times daily, along with 300mg  tablet = 400mg  four times daily., Disp: 270 capsule, Rfl: 1 .  gabapentin (NEURONTIN) 300 MG capsule, TAKE 2 CAPSULES (600 MG) AT 7AM, 2 CAPS AT 11AM, 2 CAPS AT 3PM & 3 CAPSULES (900 MG) AT BEDTIME, Disp: 270 capsule, Rfl: 1 .  hyoscyamine (ANASPAZ) 0.125 MG TBDP disintergrating tablet, Take  by mouth as needed., Disp: , Rfl:  .  ibuprofen (ADVIL) 600 MG tablet, Take 1 tablet (600 mg total) by mouth every 6 (six) hours as needed., Disp: 30 tablet, Rfl: 0 .  Multiple Vitamin (MULTIVITAMIN WITH MINERALS) TABS tablet, Take 1 tablet by mouth daily., Disp: , Rfl:  .  nystatin ointment (MYCOSTATIN), Apply topically as needed. (Patient not taking: Reported on 12/09/2020), Disp: , Rfl:  .  potassium chloride (KLOR-CON) 10 MEQ tablet, Take 2 tablets (20 mEq total) by mouth daily., Disp: 60 tablet, Rfl: 5 .  sacubitril-valsartan (ENTRESTO) 24-26 MG, Take 1 tablet by mouth 2 (two) times daily., Disp: 60 tablet, Rfl:  3   COUNSELING POINTS/CLINICAL PEARLS Entresto (Goal: 97/103 mg twice daily)  Warn male patient to avoid pregnancy during therapy and to report a pregnancy to a physician.  Advise patient to report symptomatic hypotension.  Side effects may include hyperkalemia, cough, dizziness, or renal failure. Furosemide  Drug causes sun-sensitivity. Advise patient to use sunscreen and avoid tanning beds. Patient should avoid activities requiring coordination until drug effects are realized, as drug may cause dizziness, vertigo, or blurred vision. This drug may cause hyperglycemia, hyperuricemia, constipation, diarrhea, loss of appetite, nausea, vomiting, purpuric disorder, cramps, spasticity, asthenia, headache, paresthesia, or scaling eczema. Instruct patient to report unusual bleeding/bruising or signs/symptoms of hypotension, infection, pancreatitis, or ototoxicity (tinnitus, hearing impairment). Advise patient to report signs/symptoms of a severe skin reactions (flu-like symptoms, spreading red rash, or skin/mucous membrane blistering) or erythema multiforme. Instruct patient to eat high-potassium foods during drug therapy, as directed by healthcare professional.  Patient should not drink alcohol while taking this drug.  DRUGS TO AVOID IN HEART FAILURE  Drug or Class Mechanism  Analgesics . NSAIDs . COX-2 inhibitors . Glucocorticoids  Sodium and water retention, increased systemic vascular resistance, decreased response to diuretics   Diabetes Medications . Metformin . Thiazolidinediones o Rosiglitazone (Avandia) o Pioglitazone (Actos) . DPP4 Inhibitors o Saxagliptin (Onglyza) o Sitagliptin (Januvia)   Lactic acidosis Possible calcium channel blockade   Unknown  Antiarrhythmics . Class I  o Flecainide o Disopyramide . Class III o Sotalol . Other o Dronedarone  Negative inotrope, proarrhythmic   Proarrhythmic, beta blockade  Negative inotrope  Antihypertensives . Alpha  Blockers o Doxazosin . Calcium Channel Blockers o Diltiazem o Verapamil o Nifedipine . Central Alpha Adrenergics o Moxonidine . Peripheral Vasodilators o Minoxidil  Increases renin and aldosterone  Negative inotrope    Possible sympathetic withdrawal  Unknown  Anti-infective . Itraconazole . Amphotericin B  Negative inotrope Unknown  Hematologic . Anagrelide . Cilostazol   Possible inhibition of PD IV Inhibition of PD III causing arrhythmias  Neurologic/Psychiatric . Stimulants . Anti-Seizure Drugs o Carbamazepine o Pregabalin . Antidepressants o Tricyclics o Citalopram . Parkinsons o Bromocriptine o Pergolide o Pramipexole . Antipsychotics o Clozapine . Antimigraine o Ergotamine o Methysergide . Appetite suppressants . Bipolar o Lithium  Peripheral alpha and beta agonist activity  Negative inotrope and chronotrope Calcium channel blockade  Negative inotrope, proarrhythmic Dose-dependent QT prolongation  Excessive serotonin activity/valvular damage Excessive serotonin activity/valvular damage Unknown  IgE mediated hypersensitivy, calcium channel blockade  Excessive serotonin activity/valvular damage Excessive serotonin activity/valvular damage Valvular damage  Direct myofibrillar degeneration, adrenergic stimulation  Antimalarials . Chloroquine . Hydroxychloroquine Intracellular inhibition of lysosomal enzymes  Urologic Agents . Alpha Blockers o Doxazosin o Prazosin o Tamsulosin o Terazosin  Increased renin and aldosterone  Adapted from Page RL, et al. "Drugs That May Cause or Exacerbate Heart Failure: A Scientific  Statement from the [place]." Circulation 2016; 134:e32-e69. DOI: 10.1161/CIR.0000000000000426   MEDICATION ADHERENCES TIPS AND STRATEGIES 1. Taking medication as prescribed improves patient outcomes in heart failure (reduces hospitalizations, improves symptoms, increases survival) 2. Side effects of  medications can be managed by decreasing doses, switching agents, stopping drugs, or adding additional therapy. Please let someone in the [facility] know if you have having bothersome side effects so we can modify your regimen. Do not alter your medication regimen without talking to [place].  3. Medication reminders can help patients remember to take drugs on time. If you are missing or forgetting doses you can try linking behaviors, using pill boxes, or an electronic reminder like an alarm on your phone or an app. Some people can also get automated phone calls as medication reminders.

## 2020-12-13 ENCOUNTER — Telehealth: Payer: Self-pay

## 2020-12-13 NOTE — Chronic Care Management (AMB) (Addendum)
    Chronic Care Management Pharmacy Assistant   Name: Clanton Emanuelson  MRN: [id number] DOB: [birth year]  Reason for Encounter: CCM Appointment Reminder    Medications: Outpatient Encounter Medications as of 12/13/2020  Medication Sig   budesonide (ENTOCORT EC) 3 MG 24 hr capsule Take 9 mg by mouth daily.   cholecalciferol (VITAMIN D3) 25 MCG (1000 UNIT) tablet Take 2,000 Units by mouth daily.   dapagliflozin propanediol (FARXIGA) 5 MG TABS tablet Take 1 tablet (5 mg total) by mouth daily before breakfast. (Patient not taking: Reported on 12/09/2020)   diphenoxylate-atropine (LOMOTIL) 2.5-0.025 MG tablet Take 1 tablet by mouth 4 (four) times daily.   furosemide (LASIX) 40 MG tablet Take 1 tablet (40 mg total) by mouth 2 (two) times daily.   gabapentin (NEURONTIN) 100 MG capsule Take 1 tablet four times daily, along with 300mg  tablet = 400mg  four times daily.   gabapentin (NEURONTIN) 300 MG capsule TAKE 2 CAPSULES (600 MG) AT 7AM, 2 CAPS AT 11AM, 2 CAPS AT 3PM & 3 CAPSULES (900 MG) AT BEDTIME   hyoscyamine (ANASPAZ) 0.125 MG TBDP disintergrating tablet Take by mouth as needed.   ibuprofen (ADVIL) 600 MG tablet Take 1 tablet (600 mg total) by mouth every 6 (six) hours as needed.   Multiple Vitamin (MULTIVITAMIN WITH MINERALS) TABS tablet Take 1 tablet by mouth daily.   nystatin ointment (MYCOSTATIN) Apply topically as needed. (Patient not taking: Reported on 12/09/2020)   potassium chloride (KLOR-CON) 10 MEQ tablet Take 2 tablets (20 mEq total) by mouth daily.   sacubitril-valsartan (ENTRESTO) 24-26 MG Take 1 tablet by mouth 2 (two) times daily.   No facility-administered encounter medications on file as of 12/13/2020.   Attempted to contact Mr. Faircloth to remind him of his upcoming telephone visit with Debbora Dus on 12/15/20 at 10:00 AM.  Left message to remind him to have all medications, supplements and any blood glucose and blood pressure readings available for review at appointment.  Star  Rating Drugs: Medication:  Last Fill: Day Supply Fariga 5 mg - no longer taking Entresto 24-26 mg 12/09/20 [place], CPP notified  Margaretmary Dys, [place] Assistant [phone]  I have reviewed the care management and care coordination activities outlined in this encounter and I am certifying that I agree with the content of this note. No further action required.  Debbora Dus, PharmD Clinical Pharmacist [facility] at [facility] [phone]

## 2020-12-15 ENCOUNTER — Telehealth: Payer: Medicare Other

## 2020-12-15 NOTE — Progress Notes (Deleted)
Chronic Care Management Pharmacy Note  12/15/2020 Name:  Kyle Ramsey MRN:  [id number] DOB:  [birth year]  Subjective: Kyle Ramsey is an 65 y.o. year old male who is a primary patient of Cody, Jobe Marker, MD.  The CCM team was consulted for assistance with disease management and care coordination needs.    Engaged with patient by telephone for initial visit in response to provider referral for pharmacy case management and/or care coordination services.   Consent to Services:  The patient was given the following information about Chronic Care Management services today, agreed to services, and gave verbal consent: 1. CCM service includes personalized support from designated clinical staff supervised by the primary care provider, including individualized plan of care and coordination with other care providers 2. 24/7 contact phone numbers for assistance for urgent and routine care needs. 3. Service will only be billed when office clinical staff spend 20 minutes or more in a month to coordinate care. 4. Only one practitioner may furnish and bill the service in a calendar month. 5.The patient may stop CCM services at any time (effective at the end of the month) by phone call to the office staff. 6. The patient will be responsible for cost sharing (co-pay) of up to 20% of the service fee (after annual deductible is met). Patient agreed to services and consent obtained.  Patient Care Team: Lesleigh Noe, MD as PCP - General (Family Medicine) Debbora Dus, [facility] as Pharmacist (Pharmacist)  CCM Consent 09/09/20  Recent office visits: 11/15/20 - CCM call - patient stopped [place] after 2 days due to side effects 09/08/20 - DM, Checking sugar at home 150-180; Sugar 1 hour after 250-300. Failed metformin, glipizide, and glimepiride. Refer to CCM for cost selection/samples. HTN, not willing to try medication. Chronic diarrhea, on Lomotil, GI support. Lipid panel today, discuss statin at follow up.  Neuropathy, on gabapentin, helping. Tobacco use, encouraged cutting back, candidate for CT screening. Myopathy followed by neurology, DDD, repeat MRI pending.   Recent consult visits: 12/09/20 - [place]- Cardiology- Patient agreeable to trying entresto 24/62m BID; 30 day voucher given to patient; said that he got short of breath with losartan and didn't like how farxiga made him feel; says that he reads about all the potential side effects of medications. Continue weighing daily. Call for an overnight weight gain of > 2 pounds or a weekly weight gain of >5 pounds. 11/25/20 - Echo 35-40% 11/03/[facility]- discontinued losartan due to side effects. Increased Furosemide from 40 mg daily to 40 mg BID.  10/18/20- [facility]- General surgery 09/26/20 - Hernia surgery  06/30/20 - Neurology   Hospital visits:  10/25/20- ED visit for acute heart failure  Objective:  Lab Results  Component Value Date   CREATININE 1.08 11/03/2020   BUN 18 11/03/2020   GFR 89.52 09/08/2020   GFRNONAA >60 11/03/2020   NA 138 11/03/2020   K 4.5 11/03/2020   CALCIUM 8.8 (L) 11/03/2020   CO2 30 11/03/2020    Lab Results  Component Value Date/Time   HGBA1C 7.3 (A) 09/08/2020 11:58 AM   GFR 89.52 09/08/2020 12:22 PM    Last diabetic Eye exam: No results found for: HMDIABEYEEXA  Last diabetic Foot exam: No results found for: HMDIABFOOTEX   Lab Results  Component Value Date   CHOL 171 09/08/2020   HDL 45.50 09/08/2020   LDLCALC 111 (H) 09/08/2020   TRIG 73.0 09/08/2020   CHOLHDL 4 09/08/2020   Hepatic Function  Latest Ref Rng & Units 10/25/2020 09/08/2020 11/15/2011  Total Protein 6.5 - 8.1 g/dL 5.9(L) 6.0 6.7  Albumin 3.5 - 5.0 g/dL 3.3(L) 4.0 4.4  AST 15 - 41 U/L 14(L) 12 22  ALT 0 - 44 U/L _0 Alk Phosphatase 38 - 126 U/L 60 47 80  Total Bilirubin 0.3 - 1.2 mg/dL 0.9 1.2 1.5(H)  Bilirubin, Direct 0.0 - 0.2 mg/dL 0.1 - -   No results found for: TSH, FREET4  CBC  Latest Ref Rng & Units 10/25/2020 09/08/2020 11/15/2011  WBC 4.0 - 10.5 K/uL 5.8 8.3 6.9  Hemoglobin 13.0 - 17.0 g/dL 10.8(L) 13.5 17.0  Hematocrit 39.0 - 52.0 % 31.3(L) 38.2(L) 50.1  Platelets 150 - 400 K/uL 219 200.0 -    No results found for: VD25OH  Clinical ASCVD: Yes  The 10-year ASCVD risk score Mikey Bussing DC Jr., et al., 2013) is: 43.6%   Values used to calculate the score:     Age: 66 years     Sex: Male     Is Non-Hispanic African American: No     Diabetic: Yes     Tobacco smoker: Yes     Systolic Blood Pressure: 528 mmHg     Is BP treated: Yes     HDL Cholesterol: 45.5 mg/dL     Total Cholesterol: 171 mg/dL    Depression screen PHQ 2/9 09/08/2020  Decreased Interest 0  Down, Depressed, Hopeless 0  PHQ - 2 Score 0    Social History   Tobacco Use  Smoking Status Current Every Day Smoker  . Packs/day: 0.50  . Years: 40.00  . Pack years: 20.00  . Types: Cigarettes  Smokeless Tobacco Never Used  Tobacco Comment   1 ppd 40 years, cut back to 0.5 ppd   BP Readings from Last 3 Encounters:  12/09/20 (!) 154/86  11/03/20 (!) 166/95  10/31/20 (!) 155/92   Pulse Readings from Last 3 Encounters:  12/09/20 82  11/03/20 92  10/31/20 96   Wt Readings from Last 3 Encounters:  12/09/20 141 lb (64 kg)  11/03/20 145 lb 6 oz (65.9 kg)  10/31/20 145 lb (65.8 kg)    Assessment/Interventions: Review of patient past medical history, allergies, medications, health status, including review of consultants reports, laboratory and other test data, was performed as part of comprehensive evaluation and provision of chronic care management services.   SDOH:  (Social Determinants of Health) assessments and interventions performed: Yes   CCM Care Plan  Allergies  Allergen Reactions  . Losartan Shortness Of Breath  . Azithromycin Other (See Comments)  . Codeine Nausea And Vomiting  . Erythromycin Other (See Comments)    Gall bladder problem  . Glipizide Other (See Comments)  .  Metformin Hcl Er Other (See Comments)  . Onion Other (See Comments)    Medications Reviewed Today    Reviewed by Alisa Graff, FNP (Family Nurse Practitioner) on 12/09/20 at 2100  Med List Status: <None>  Medication Order Taking? Sig Documenting Provider Last Dose Status Informant  budesonide (ENTOCORT EC) 3 MG 24 hr capsule [id number] Yes Take 9 mg by mouth daily. [provider] Taking Active   cholecalciferol (VITAMIN D3) 25 MCG (1000 UNIT) tablet [id number] Yes Take 2,000 Units by mouth daily. [provider] Taking Active   dapagliflozin propanediol (FARXIGA) 5 MG TABS tablet [id number] No Take 1 tablet (5 mg total) by mouth daily before breakfast.  Patient not taking: Reported on 12/09/2020   Waunita Schooner  R, MD Not Taking Active   diphenoxylate-atropine (LOMOTIL) 2.5-0.025 MG tablet [id number] Yes Take 1 tablet by mouth 4 (four) times daily. [provider] Taking Active   furosemide (LASIX) 40 MG tablet [id number] Yes Take 1 tablet (40 mg total) by mouth 2 (two) times daily. Alisa Graff, FNP Taking Active   gabapentin (NEURONTIN) 100 MG capsule [id number] Yes Take 1 tablet four times daily, along with 364m tablet = 4062mfour times daily. PaNarda Amber, DO Taking Active   gabapentin (NEURONTIN) 300 MG capsule [id number] TAKE 2 CAPSULES (600 MG) AT 7AM, 2 CAPS AT 11AM, 2 CAPS AT 3PM & 3 CAPSULES (900 MG) AT BEDTIME PaNarda Amber, DO Taking Active   hyoscyamine (ANASPAZ) 0.125 MG TBDP disintergrating tablet [id number] Take by mouth as needed. [provider] Taking Active   ibuprofen (ADVIL) 600 MG tablet [id number] Take 1 tablet (600 mg total) by mouth every 6 (six) hours as needed. LaChase PicketMD Taking Active   Multiple Vitamin (MULTIVITAMIN WITH MINERALS) TABS tablet [id number] Take 1 tablet by mouth daily. [provider] Taking Active   nystatin ointment (MYCOSTATIN) [id number] Apply topically as needed.  Patient  not taking: Reported on 12/09/2020   [provider] Not Taking Active   potassium chloride (KLOR-CON) 10 MEQ tablet [id number] Take 2 tablets (20 mEq total) by mouth daily. HaAlisa GraffFNP Taking Expired 11/27/20 2359   sacubitril-valsartan (ENTRESTO) 24-26 MG [id number] Take 1 tablet by mouth 2 (two) times daily. HaAlisa GraffFNP  Active           Patient Active Problem List   Diagnosis Date Noted  . Atrial bigeminy 09/08/2020  . Lymphocytic colitis 09/08/2020  . Essential hypertension 09/08/2020  . Neuropathy 09/08/2020  . Cataract 09/08/2020  . Myopathy 09/08/2020  . Tobacco abuse 09/08/2020  . Aortic atherosclerosis ([place]CL[place]01/27/2022  . Bilateral inguinal hernia, without obstruction or gangrene, not specified as recurrent 06/27/2020  . Type 2 diabetes mellitus with other specified complication ([id number]  Immunization History  Administered Date(s) Administered  . PFIZER(Purple Top)SARS-COV-2 Vaccination 11/19/2019, 12/26/2019    Conditions to be addressed/monitored:  Hypertension, Hyperlipidemia, Diabetes and Tobacco use  There are no care plans that you recently modified to display for this patient.   Patient Care Plan: CCM Pharmacy Care Plan    Problem Identified: CHL AMB "PATIENT-SPECIFIC PROBLEM"     Long-Range Goal: Disease Management   Start Date: 10/12/2020  Priority: High  Note:   Current Barriers:  . Unable to achieve control of blood pressure and diabetes   . Multiple intolerances to diabetic medications  Pharmacist Clinical Goal(s):  . Marland Kitchenver the next 30 days, patient will adhere to plan to optimize therapeutic regimen for diabetes as evidenced by report of adherence to recommended medication management changes through collaboration with PharmD and provider.   Interventions: . 1:1 collaboration with CoLesleigh NoeMD regarding development and update of comprehensive plan of care as evidenced by provider attestation and  co-signature . Inter-disciplinary care team collaboration (see longitudinal plan of care) . Comprehensive medication review performed; medication list updated in electronic medical record  Hypertension (BP goal <140/90) -Uncontrolled -Current treatment: . No pharmacotherapy -Medications previously tried: none -Current home readings: thinks he has blood pressure monitor, not monitoring regularly -Patient prefers to discuss one medical condition at a time - focused on diabetes.  -Recommended to discuss at follow up  Hyperlipidemia: (LDL goal <  70) -Uncontrolled -Current treatment: . Atorvastatin 10 mg - 1 tablet daily (not taking) -Medications previously tried: none  -Pt reports he never started atorvastatin and has no interest in starting a statin due to side effects. Reports his cholesterol has always been fine and was only elevated at last reading. Reports cholesterol medication resulted in friend having a stroke. -Current dietary patterns: reports low carb diet  -Current exercise habits: denies formal exercise -Recommended lifestyle management  Diabetes (A1c goal <7.5%) -Controlled -Current medications: . Glimepiride 2 mg - 1 tablet daily (not taking) -Medications previously tried: metformin - GI, glipizide, glimepiride - hypoglycemia, Januvia - cost  -Current home glucose readings - checking once daily  . fasting glucose: 150  . post prandial glucose: 250-280 (varies)  -Reports hyperglycemic symptoms -Pt reports he has lost some weight and eating "air and cardboard". Wants to start a medication that does not cause diarrhea and can allow him to eat a little bit more. -Educated on SGLT-2 side effects, cost, and benefits -Foot and eye exam due.  -Recommended Farxiga 5 mg daily - use coupon and apply for PAP. Would like delivered through [employer]. He will come into on 3/2 10 AM for PAP.  CMA follow up in 2 weeks to review tolerance and PAP.  Misc Med Review:   Gabapentin 300 mg  - not taking, pain has improved since hernia surgery   Gabapentin 100 mg - not taking, pain has improved since hernia surgery  Lomotil PRN - has not needed recently  Hyoscyamine 0.125 mg TBDP - has not needed recently  Advil 600 mg - uses 3 tablets (600 mg) once daily for arthritis in his back  Nystatin - uses PRN  Patient Goals/Self-Care Activities . Over the next 30 days, patient will:  - take medications as prescribed - call if any side effects with Farxiga  Follow Up Plan: The care management team will reach out to the patient again over the next 14 days.   Medication Assistance: Application for Farxiga  medication assistance program. in process.  Anticipated assistance start date 11/11/20.  See plan of care for additional detail.     Patient's preferred pharmacy is:  [id number] [place] [place]Phone: [phone]: [phone] Uses pill box? No - not taking any routine medications  Patient agrees to have FIrandelivered by [employer] in order to use a coupon. [facility]  Care Plan and Follow Up Patient Decision:  Patient agrees to Care Plan and Follow-up.  MDebbora Dus PharmD Clinical Pharmacist [phone]

## 2020-12-18 ENCOUNTER — Other Ambulatory Visit: Payer: Self-pay | Admitting: Neurology

## 2020-12-19 ENCOUNTER — Telehealth: Payer: Self-pay | Admitting: Neurology

## 2020-12-19 ENCOUNTER — Other Ambulatory Visit: Payer: Self-pay | Admitting: *Deleted

## 2020-12-19 DIAGNOSIS — G629 Polyneuropathy, unspecified: Secondary | ICD-10-CM

## 2020-12-19 MED ORDER — GABAPENTIN 300 MG PO CAPS: ORAL_CAPSULE | ORAL | 0 refills | Status: DC

## 2020-12-19 NOTE — Telephone Encounter (Signed)
Patient called and said he is almost out of his gabapentin 300mg  and he needs refills to last until his next appointment on 02/20/21 with Dr. Posey Pronto.  90 day increments, if possible  [facility] [place]

## 2020-12-19 NOTE — Telephone Encounter (Signed)
Ok to send refill until his appt, thanks

## 2020-12-19 NOTE — Telephone Encounter (Signed)
[facility]illed Rx [facility]

## 2020-12-19 NOTE — Telephone Encounter (Signed)
Needs to schedule f/u with Dr. Posey Pronto for f/u on studies and refills, thanks

## 2020-12-22 ENCOUNTER — Telehealth: Payer: Self-pay

## 2020-12-22 NOTE — Chronic Care Management (AMB) (Addendum)
Chronic Care Management Pharmacy Assistant   Name: Kyle Ramsey  MRN: [id number] DOB: Apr 06, 1956   Reason for Encounter: Disease State - Diabetes  Recent office visits:  None since last CCM contact  Recent consult visits:  12/09/20- Darylene Price, [place]- Cardiology- Started Entresto 24-26 mg 1 tablet in AM and 1 in PM. Follow up in 2 weeks. 11/25/20- Darylene Price, [facility] visits:  10/25/20- ED visit- Acute HF.   Medications: Outpatient Encounter Medications as of 12/22/2020  Medication Sig   budesonide (ENTOCORT EC) 3 MG 24 hr capsule Take 9 mg by mouth daily.   cholecalciferol (VITAMIN D3) 25 MCG (1000 UNIT) tablet Take 2,000 Units by mouth daily.   dapagliflozin propanediol (FARXIGA) 5 MG TABS tablet Take 1 tablet (5 mg total) by mouth daily before breakfast. (Patient not taking: Reported on 12/09/2020)   diphenoxylate-atropine (LOMOTIL) 2.5-0.025 MG tablet Take 1 tablet by mouth 4 (four) times daily.   furosemide (LASIX) 40 MG tablet Take 1 tablet (40 mg total) by mouth 2 (two) times daily.   gabapentin (NEURONTIN) 100 MG capsule Take 1 tablet four times daily, along with 300mg  tablet = 400mg  four times daily.   gabapentin (NEURONTIN) 300 MG capsule TAKE 2 CAPSULES (600 MG) AT 7AM, 2 CAPS AT 11AM, 2 CAPS AT 3PM & 3 CAPSULES (900 MG) AT BEDTIME   hyoscyamine (ANASPAZ) 0.125 MG TBDP disintergrating tablet Take by mouth as needed.   ibuprofen (ADVIL) 600 MG tablet Take 1 tablet (600 mg total) by mouth every 6 (six) hours as needed.   Multiple Vitamin (MULTIVITAMIN WITH MINERALS) TABS tablet Take 1 tablet by mouth daily.   nystatin ointment (MYCOSTATIN) Apply topically as needed. (Patient not taking: Reported on 12/09/2020)   potassium chloride (KLOR-CON) 10 MEQ tablet Take 2 tablets (20 mEq total) by mouth daily.   sacubitril-valsartan (ENTRESTO) 24-26 MG Take 1 tablet by mouth 2 (two) times daily.   No facility-administered encounter medications on  file as of 12/22/2020.     Recent Relevant Labs: Lab Results  Component Value Date/Time   HGBA1C 7.3 (A) 09/08/2020 11:58 AM    Kidney Function Lab Results  Component Value Date/Time   CREATININE 1.08 11/03/2020 01:50 PM   CREATININE 0.89 10/31/2020 01:12 PM   CREATININE 1.13 11/15/2011 01:25 PM   GFR 89.52 09/08/2020 12:22 PM   GFRNONAA >60 11/03/2020 01:50 PM    Current antihyperglycemic regimen:  Glimepiride 2 mg - 1 tablet daily (not taking)   Patient verbally confirms he is taking the above medications as directed. No  What recent interventions/DTPs have been made to improve glycemic control:  Pt tried [place] but stopped after 2-3 days due to not feeling well   Have there been any recent hospitalizations or ED visits since last visit with CPP? No  Patient denies hypoglycemic symptoms, including Pale, Sweaty, Shaky, Hungry, Nervous/irritable and Vision changes  Patient denies hyperglycemic symptoms, including blurry vision, excessive thirst, fatigue, polyuria and weakness  How often are you checking your blood sugar? once daily  What are your blood sugars ranging?  States he checks his blood sugars randomly throughout the day. He notes it ranges from 150-180 no matter when he checks it.   On insulin? No  During the week, how often does your blood glucose drop below 70? no  Are you checking your feet daily/regularly? Yes  Adherence Review: Is the patient currently on a STATIN medication? No Is the patient currently on ACE/ARB medication? Yes Does the  patient have >5 day gap between last estimated fill dates? CPP to review  Star Rating Drugs:  Medication:  Last Fill: Day Supply Entresto 24-26 mg 12/09/20 30  Cardiology appointment for heart failure on 01/24/21  Follow-Up:  Pharmacist Review  Debbora Dus, CPP notified  CMA Time: 41 min Margaretmary Dys, [place] [phone]  I have reviewed the care management and care  coordination activities outlined in this encounter and I am certifying that I agree with the content of this note. Patient missed last CCM visit. Will try to reconnect again next month to discuss diabetes treatment options.  Debbora Dus, PharmD Clinical Pharmacist [facility] [phone]

## 2020-12-23 ENCOUNTER — Ambulatory Visit (HOSPITAL_BASED_OUTPATIENT_CLINIC_OR_DEPARTMENT_OTHER): Payer: Medicare Other | Admitting: Family

## 2020-12-23 ENCOUNTER — Other Ambulatory Visit: Payer: Self-pay

## 2020-12-23 ENCOUNTER — Encounter: Payer: Self-pay | Admitting: Family

## 2020-12-23 ENCOUNTER — Other Ambulatory Visit
Admission: RE | Admit: 2020-12-23 | Discharge: 2020-12-23 | Disposition: A | Discharge status: Home / Self Care | Payer: Medicare Other | Source: Ambulatory Visit | Attending: Family | Admitting: Family

## 2020-12-23 VITALS — BP 171/92 | HR 84 | Resp 18 | Wt 142.2 lb

## 2020-12-23 DIAGNOSIS — Z720 Tobacco use: Secondary | ICD-10-CM

## 2020-12-23 DIAGNOSIS — G8929 Other chronic pain: Secondary | ICD-10-CM | POA: Insufficient documentation

## 2020-12-23 DIAGNOSIS — F17210 Nicotine dependence, cigarettes, uncomplicated: Secondary | ICD-10-CM | POA: Insufficient documentation

## 2020-12-23 DIAGNOSIS — Z7984 Long term (current) use of oral hypoglycemic drugs: Secondary | ICD-10-CM | POA: Insufficient documentation

## 2020-12-23 DIAGNOSIS — I130 Hypertensive heart and chronic kidney disease with heart failure and stage 1 through stage 4 chronic kidney disease, or unspecified chronic kidney disease: Secondary | ICD-10-CM | POA: Diagnosis not present

## 2020-12-23 DIAGNOSIS — Z79899 Other long term (current) drug therapy: Secondary | ICD-10-CM | POA: Diagnosis not present

## 2020-12-23 DIAGNOSIS — E1122 Type 2 diabetes mellitus with diabetic chronic kidney disease: Secondary | ICD-10-CM | POA: Insufficient documentation

## 2020-12-23 DIAGNOSIS — E1169 Type 2 diabetes mellitus with other specified complication: Secondary | ICD-10-CM

## 2020-12-23 DIAGNOSIS — Z885 Allergy status to narcotic agent status: Secondary | ICD-10-CM | POA: Insufficient documentation

## 2020-12-23 DIAGNOSIS — N189 Chronic kidney disease, unspecified: Secondary | ICD-10-CM | POA: Insufficient documentation

## 2020-12-23 DIAGNOSIS — Z716 Tobacco abuse counseling: Secondary | ICD-10-CM | POA: Insufficient documentation

## 2020-12-23 DIAGNOSIS — I5022 Chronic systolic (congestive) heart failure: Secondary | ICD-10-CM

## 2020-12-23 DIAGNOSIS — R42 Dizziness and giddiness: Secondary | ICD-10-CM | POA: Insufficient documentation

## 2020-12-23 DIAGNOSIS — Z881 Allergy status to other antibiotic agents status: Secondary | ICD-10-CM | POA: Diagnosis not present

## 2020-12-23 DIAGNOSIS — Z7952 Long term (current) use of systemic steroids: Secondary | ICD-10-CM | POA: Insufficient documentation

## 2020-12-23 DIAGNOSIS — I10 Essential (primary) hypertension: Secondary | ICD-10-CM

## 2020-12-23 DIAGNOSIS — F419 Anxiety disorder, unspecified: Secondary | ICD-10-CM | POA: Diagnosis not present

## 2020-12-23 LAB — BASIC METABOLIC PANEL
Anion gap: 6 (ref 5–15)
BUN: 17 mg/dL (ref 8–23)
CO2: 28 mmol/L (ref 22–32)
Calcium: 9.1 mg/dL (ref 8.9–10.3)
Chloride: 105 mmol/L (ref 98–111)
Creatinine, Ser: 0.82 mg/dL (ref 0.61–1.24)
GFR, Estimated: >60 mL/min (ref >60)
Glucose, Bld: 126 mg/dL — ABNORMAL HIGH (ref 70–99)
Potassium: 4.4 mmol/L (ref 3.5–5.1)
Sodium: 139 mmol/L (ref 135–145)

## 2020-12-23 NOTE — Progress Notes (Signed)
Patient ID: Kyle Ramsey, male    DOB: 01-20-1956, 65 y.o.   MRN: [id number]  HPI  Kyle Ramsey is a 65 y/o male with a history of DM, CKD, HTN, current tobacco use and chronic heart failure.   Echo report from 11/25/20 reviewed and showed an EF of 35-40% along with mild LAE and mild Kyle.   Was in the ED 10/25/20 due to leg swelling. CT scans negative for PE/ DVT or venous thrombosis in his abdomen. Given lasix/ potassium and released.    He presents today for a follow-up visit with a chief complaint of minimal shortness of breath upon moderate exertion. He describes this as chronic in nature having been present for several years although he does feel like it has improved some. He has associated dizziness, chronic pain and anxiety along with this. He denies any abdominal distention, palpitations, pedal edema, chest pain, cough, fatigue or weight gain.   Has tolerated entresto without known side effect but is adamant that he doesn't want to add another medication, titrate up entresto or be referred anywhere for anything else at this time. He admits that he's very skeptical of medications and that they tend to create more problems than help.   Past Medical History:  Diagnosis Date  . CHF (congestive heart failure) ([place])   . Diabetes mellitus ([place])   . Hypertension   . Kidney calculi    No past surgical history on file. Family History  Problem Relation Age of Onset  . Diabetes Mother   . Dementia Mother   . Breast cancer Mother   . Diabetes Father   . Dementia Father    Social History   Tobacco Use  . Smoking status: Current Every Day Smoker    Packs/day: 0.50    Years: 40.00    Pack years: 20.00    Types: Cigarettes  . Smokeless tobacco: Never Used  . Tobacco comment: 1 ppd 40 years, cut back to 0.5 ppd  Substance Use Topics  . Alcohol use: Not Currently    Comment: nothing    Allergies  Allergen Reactions  . Losartan Shortness Of Breath  . Azithromycin Other (See  Comments)  . Codeine Nausea And Vomiting  . Erythromycin Other (See Comments)    Gall bladder problem  . Glipizide Other (See Comments)  . Metformin Hcl Er Other (See Comments)  . Onion Other (See Comments)   Prior to Admission medications   Medication Sig Start Date End Date Taking? Authorizing Provider  budesonide (ENTOCORT EC) 3 MG 24 hr capsule Take 9 mg by mouth daily.   Yes [provider]  cholecalciferol (VITAMIN D3) 25 MCG (1000 UNIT) tablet Take 2,000 Units by mouth daily.   Yes [provider]  diphenoxylate-atropine (LOMOTIL) 2.5-0.025 MG tablet Take 1 tablet by mouth 4 (four) times daily. 07/09/20  Yes [provider]  furosemide (LASIX) 40 MG tablet Take 1 tablet (40 mg total) by mouth 2 (two) times daily. 11/03/20  Yes Darylene Price A, FNP  gabapentin (NEURONTIN) 100 MG capsule Take 1 tablet four times daily, along with 300mg  tablet = 400mg  four times daily. 07/15/20  Yes Patel, Donika K, DO  gabapentin (NEURONTIN) 300 MG capsule TAKE 2 CAPSULES (600 MG) AT 7AM, 2 CAPS AT 11AM, 2 CAPS AT 3PM & 3 CAPSULES (900 MG) AT BEDTIME 12/19/20  Yes Patel, Donika K, DO  hyoscyamine (ANASPAZ) 0.125 MG TBDP disintergrating tablet Take by mouth as needed. 08/29/20  Yes [provider]  ibuprofen (ADVIL) 600 MG tablet Take 1 tablet (600 mg total) by mouth every 6 (six) hours as needed. 06/14/19  Yes Lamptey, Myrene Galas, MD  Multiple Vitamin (MULTIVITAMIN WITH MINERALS) TABS tablet Take 1 tablet by mouth daily.   Yes [provider]  potassium chloride (KLOR-CON) 10 MEQ tablet Take 2 tablets (20 mEq total) by mouth daily. 10/28/20 11/27/20 Yes Yuki Purves A, FNP  sacubitril-valsartan (ENTRESTO) 24-26 MG Take 1 tablet by mouth 2 (two) times daily. 12/09/20  Yes Darylene Price A, FNP  dapagliflozin propanediol (FARXIGA) 5 MG TABS tablet Take 1 tablet (5 mg total) by mouth daily before breakfast. Patient not taking: Reported on 12/23/2020 10/11/20   Lesleigh Noe,  MD  nystatin ointment (MYCOSTATIN) Apply topically as needed. Patient not taking: No sig reported 08/01/20   [provider]   Review of Systems  Constitutional: Negative for appetite change and fatigue.  HENT: Positive for hearing loss. Negative for congestion, postnasal drip and sore throat.   Eyes: Negative.   Respiratory: Positive for shortness of breath (with moderate exertion). Negative for cough, chest tightness and wheezing.   Cardiovascular: Negative for chest pain, palpitations and leg swelling.  Gastrointestinal: Negative for abdominal distention and abdominal pain.  Endocrine: Negative.   Genitourinary: Negative.   Musculoskeletal: Positive for arthralgias (both legs). Negative for back pain.  Skin: Negative.   Allergic/Immunologic: Negative.   Neurological: Positive for dizziness. Negative for light-headedness.       "off balance"  Hematological: Negative for adenopathy. Does not bruise/bleed easily.  Psychiatric/Behavioral: Negative for agitation and sleep disturbance. The patient is nervous/anxious.    Vitals:   12/23/20 1401  BP: (!) 171/92  Pulse: 84  Resp: 18  SpO2: 98%  Weight: 142 lb 4 oz (64.5 kg)   Wt Readings from Last 3 Encounters:  12/23/20 142 lb 4 oz (64.5 kg)  12/09/20 141 lb (64 kg)  11/03/20 145 lb 6 oz (65.9 kg)   Lab Results  Component Value Date   CREATININE 1.08 11/03/2020   CREATININE 0.89 10/31/2020   CREATININE 0.95 10/25/2020    Physical Exam Vitals and nursing note reviewed. Exam conducted with a chaperone present (DIL).  Constitutional:      Appearance: Normal appearance.  HENT:     Head: Normocephalic and atraumatic.     Right Ear: Decreased hearing noted.     Left Ear: Decreased hearing noted.  Cardiovascular:     Rate and Rhythm: Normal rate and regular rhythm.  Pulmonary:     Effort: Pulmonary effort is normal. No respiratory distress.     Breath sounds: No wheezing or rales.  Abdominal:     General: There is  no distension.     Palpations: Abdomen is soft.  Musculoskeletal:        General: No tenderness or deformity.     Cervical back: Normal range of motion and neck supple.     Right lower leg: No edema.     Left lower leg: No edema.  Skin:    General: Skin is warm and dry.  Neurological:     General: No focal deficit present.     Mental Status: He is alert and oriented to person, place, and time.  Psychiatric:        Mood and Affect: Mood normal.        Behavior: Behavior normal.   Assessment & Plan:  1: Chronic heart failure with reduced ejection fraction- - NYHA class II - euvolemic today -  weighing daily; reminded to call for an overnight weight gain of > 2 pounds or a weekly weight gain of > 5 pounds - weight stable from last visit here 2 weeks ago - started on entresto 24/26mg  at last visit; tolerating without known side effects; discussed titrating it today but he defers; not interested in starting anything else either at this time - check BMP today - not adding salt  - BNP 10/31/20 was 459.0 - pharmD reconciled medications with the patient  2: HTN- - BP elevated but he says that it's always elevated at medical appointments; wanted to titrate up entresto today but he defers at this time - saw PCP Einar Pheasant) 09/08/20 - BMP 11/03/20 reviewed and showed sodium 138, potassium 4.5, creatinine 1.08 and GFR >60  3: DM- - A1c 09/08/20 was 7.3%   4: Tobacco use- - current tobacco use of 1/2 ppd - cessation discussed for 3 minutes - no longer drinking alcohol   Patient did not bring his medications nor a list. Each medication was verbally reviewed with the patient and he was encouraged to bring the bottles to every visit to confirm accuracy of list.  Return in 1 month or sooner for any questions/problems before then.

## 2020-12-23 NOTE — Progress Notes (Signed)
[facility] - PHARMACIST COUNSELING NOTE  ADHERENCE ASSESSMENT   Do you ever forget to take your medication? [] Yes (1) [x] No (0)  Do you ever skip doses due to side effects? [] Yes (1) [x] No (0)  Do you have trouble affording your medicines? [] Yes (1) [x] No (0)  Are you ever unable to pick up your medication due to transportation difficulties? [] Yes (1) [x] No (0)  Do you ever stop taking your medications because you don't believe they are helping? [] Yes (1) [x] No (0)  Total score _0______     Guideline-Directed Medical Therapy/Evidence Based Medicine  ACE/ARB/ARNI: [place] Corporal Blocker: N/A Aldosterone Antagonist: N/A Diuretic: furosemide    SUBJECTIVE  HPI:  Past Medical History:  Diagnosis Date  . CHF (congestive heart failure) ([place])   . Diabetes mellitus ([place])   . Hypertension   . Kidney calculi         OBJECTIVE   Vital signs: HR 84, BP 171/92, weight (pounds) 142 ECHO: Date 11/25/20, EF 35-40%, notes LA mildly dilated, no LVH  BMP Latest Ref Rng & Units 11/03/2020 10/31/2020 10/25/2020  Glucose 70 - 99 mg/dL 164(H) 137(H) 212(H)  BUN 8 - 23 mg/dL 18 13 12   Creatinine 0.61 - 1.24 mg/dL 1.08 0.89 0.95  Sodium 135 - 145 mmol/L 138 139 141  Potassium 3.5 - 5.1 mmol/L 4.5 3.7 2.9(L)  Chloride 98 - 111 mmol/L 100 104 101  CO2 22 - 32 mmol/L 30 28 33(H)  Calcium 8.9 - 10.3 mg/dL 8.8(L) 8.5(L) 8.5(L)    ASSESSMENT 65 yo M presenting to heart failure clinic for follow-up visit. PMH includes CHF, HTN, diabetes, and colitis. Pt is complaining of increased neuropathic pain today. Pt has an appt with neurology scheduled in July. No barriers to adherence were identified during medication reconciliation.    PLAN CHF/HTN - Continue furosemide 40 mg twice daily and KCl 20 mEq daily  - Continue Entresto 24-26 mg twice daily  - Recheck labs  Colitis/Diarrhea - Continue hyoscyamine 0.125 mg as needed - Continue  diphenoxylate-atropine four times daily as needed - Continue budesonide 9 mg daily   Pain - Begin using voltaren gel to reduce use of ibuprofen  - Continue gabapentin 600 mg at 7 AM, 11 AM, 3 PM and 900 mg at bedtime  - F/u with neurology regarding increased pain  General Health - Continue vitamin D 2000 units daily  - Continue multivitamin daily   Time spent: 15 minutes  Benn Moulder, PharmD Pharmacy Resident  12/23/2020 2:29 PM    Current Outpatient Medications:  .  budesonide (ENTOCORT EC) 3 MG 24 hr capsule, Take 9 mg by mouth daily., Disp: , Rfl:  .  cholecalciferol (VITAMIN D3) 25 MCG (1000 UNIT) tablet, Take 2,000 Units by mouth daily., Disp: , Rfl:  .  dapagliflozin propanediol (FARXIGA) 5 MG TABS tablet, Take 1 tablet (5 mg total) by mouth daily before breakfast., Disp: 30 tablet, Rfl: 1 .  diphenoxylate-atropine (LOMOTIL) 2.5-0.025 MG tablet, Take 1 tablet by mouth 4 (four) times daily., Disp: , Rfl:  .  furosemide (LASIX) 40 MG tablet, Take 1 tablet (40 mg total) by mouth 2 (two) times daily., Disp: 60 tablet, Rfl: 3 .  gabapentin (NEURONTIN) 100 MG capsule, Take 1 tablet four times daily, along with 300mg  tablet = 400mg  four times daily., Disp: 270 capsule, Rfl: 1 .  gabapentin (NEURONTIN) 300 MG capsule, TAKE 2 CAPSULES (600 MG) AT 7AM, 2 CAPS AT 11AM, 2 CAPS AT  3PM & 3 CAPSULES (900 MG) AT BEDTIME, Disp: 270 capsule, Rfl: 0 .  hyoscyamine (ANASPAZ) 0.125 MG TBDP disintergrating tablet, Take by mouth as needed., Disp: , Rfl:  .  ibuprofen (ADVIL) 600 MG tablet, Take 1 tablet (600 mg total) by mouth every 6 (six) hours as needed., Disp: 30 tablet, Rfl: 0 .  Multiple Vitamin (MULTIVITAMIN WITH MINERALS) TABS tablet, Take 1 tablet by mouth daily., Disp: , Rfl:  .  nystatin ointment (MYCOSTATIN), Apply topically as needed. (Patient not taking: Reported on 12/09/2020), Disp: , Rfl:  .  potassium chloride (KLOR-CON) 10 MEQ tablet, Take 2 tablets (20 mEq total) by mouth  daily., Disp: 60 tablet, Rfl: 5 .  sacubitril-valsartan (ENTRESTO) 24-26 MG, Take 1 tablet by mouth 2 (two) times daily., Disp: 60 tablet, Rfl: 3   COUNSELING POINTS/CLINICAL PEARLS  Entresto (Goal: 97/103 mg twice daily)  Warn male patient to avoid pregnancy during therapy and to report a pregnancy to a physician.  Advise patient to report symptomatic hypotension.  Side effects may include hyperkalemia, cough, dizziness, or renal failure. Furosemide  Drug causes sun-sensitivity. Advise patient to use sunscreen and avoid tanning beds. Patient should avoid activities requiring coordination until drug effects are realized, as drug may cause dizziness, vertigo, or blurred vision. This drug may cause hyperglycemia, hyperuricemia, constipation, diarrhea, loss of appetite, nausea, vomiting, purpuric disorder, cramps, spasticity, asthenia, headache, paresthesia, or scaling eczema. Instruct patient to report unusual bleeding/bruising or signs/symptoms of hypotension, infection, pancreatitis, or ototoxicity (tinnitus, hearing impairment). Advise patient to report signs/symptoms of a severe skin reactions (flu-like symptoms, spreading red rash, or skin/mucous membrane blistering) or erythema multiforme. Instruct patient to eat high-potassium foods during drug therapy, as directed by healthcare professional.  Patient should not drink alcohol while taking this drug.  DRUGS TO AVOID IN HEART FAILURE  Drug or Class Mechanism  Analgesics . NSAIDs . COX-2 inhibitors . Glucocorticoids  Sodium and water retention, increased systemic vascular resistance, decreased response to diuretics   Diabetes Medications . Metformin . Thiazolidinediones o Rosiglitazone (Avandia) o Pioglitazone (Actos) . DPP4 Inhibitors o Saxagliptin (Onglyza) o Sitagliptin (Januvia)   Lactic acidosis Possible calcium channel blockade   Unknown  Antiarrhythmics . Class I  o Flecainide o Disopyramide . Class  III o Sotalol . Other o Dronedarone  Negative inotrope, proarrhythmic   Proarrhythmic, beta blockade  Negative inotrope  Antihypertensives . Alpha Blockers o Doxazosin . Calcium Channel Blockers o Diltiazem o Verapamil o Nifedipine . Central Alpha Adrenergics o Moxonidine . Peripheral Vasodilators o Minoxidil  Increases renin and aldosterone  Negative inotrope    Possible sympathetic withdrawal  Unknown  Anti-infective . Itraconazole . Amphotericin B  Negative inotrope Unknown  Hematologic . Anagrelide . Cilostazol   Possible inhibition of PD IV Inhibition of PD III causing arrhythmias  Neurologic/Psychiatric . Stimulants . Anti-Seizure Drugs o Carbamazepine o Pregabalin . Antidepressants o Tricyclics o Citalopram . Parkinsons o Bromocriptine o Pergolide o Pramipexole . Antipsychotics o Clozapine . Antimigraine o Ergotamine o Methysergide . Appetite suppressants . Bipolar o Lithium  Peripheral alpha and beta agonist activity  Negative inotrope and chronotrope Calcium channel blockade  Negative inotrope, proarrhythmic Dose-dependent QT prolongation  Excessive serotonin activity/valvular damage Excessive serotonin activity/valvular damage Unknown  IgE mediated hypersensitivy, calcium channel blockade  Excessive serotonin activity/valvular damage Excessive serotonin activity/valvular damage Valvular damage  Direct myofibrillar degeneration, adrenergic stimulation  Antimalarials . Chloroquine . Hydroxychloroquine Intracellular inhibition of lysosomal enzymes  Urologic Agents . Alpha Blockers o Doxazosin o Prazosin o Tamsulosin  o Terazosin  Increased renin and aldosterone  Adapted from Page Carleene Overlie, et al. "Drugs That May Cause or Exacerbate Heart Failure: A Scientific Statement from the [place]." Circulation 2016; 134:e32-e69. DOI: 10.1161/CIR.0000000000000426   MEDICATION ADHERENCES TIPS AND STRATEGIES 1. Taking  medication as prescribed improves patient outcomes in heart failure (reduces hospitalizations, improves symptoms, increases survival) 2. Side effects of medications can be managed by decreasing doses, switching agents, stopping drugs, or adding additional therapy. Please let someone in the [facility] know if you have having bothersome side effects so we can modify your regimen. Do not alter your medication regimen without talking to [place].  3. Medication reminders can help patients remember to take drugs on time. If you are missing or forgetting doses you can try linking behaviors, using pill boxes, or an electronic reminder like an alarm on your phone or an app. Some people can also get automated phone calls as medication reminders.

## 2020-12-23 NOTE — Patient Instructions (Addendum)
Continue weighing daily and call for an overnight weight gain of > 2 pounds or a weekly weight gain of >5 pounds.   [facility] [address], [place], [place] [address] [phone]   [facility] [address] [place], [place] [address] [phone]

## 2020-12-27 ENCOUNTER — Telehealth: Payer: Self-pay | Admitting: Family

## 2020-12-27 NOTE — Telephone Encounter (Signed)
Called to notify patient he was approved for novart[employer] patient assistance for [facility] and instructions on how to set it up.   Khalid Lacko, NT

## 2021-01-24 ENCOUNTER — Ambulatory Visit: Payer: Medicare Other | Admitting: Family

## 2021-01-27 ENCOUNTER — Telehealth: Payer: Self-pay

## 2021-01-27 NOTE — Chronic Care Management (AMB) (Addendum)
Chronic Care Management Pharmacy Assistant   Name: Kyle Ramsey  MRN: [id number] DOB: [birth year]  Reason for Encounter: Disease State- Diabetes  Recent office visits:  None since last CCM contact.  Recent consult visits:  12/23/20- CHF clinic- Ordered BMP. Advised patient to start using voltaren gel to reduce use of ibuprofen.  Hospital visits:  10/25/20 ED visit for acute CHF. No admission.  Medications: Outpatient Encounter Medications as of 01/27/2021  Medication Sig   budesonide (ENTOCORT EC) 3 MG 24 hr capsule Take 9 mg by mouth daily.   cholecalciferol (VITAMIN D3) 25 MCG (1000 UNIT) tablet Take 2,000 Units by mouth daily.   dapagliflozin propanediol (FARXIGA) 5 MG TABS tablet Take 1 tablet (5 mg total) by mouth daily before breakfast. (Patient not taking: Reported on 12/23/2020)   diphenoxylate-atropine (LOMOTIL) 2.5-0.025 MG tablet Take 1 tablet by mouth 4 (four) times daily.   furosemide (LASIX) 40 MG tablet Take 1 tablet (40 mg total) by mouth 2 (two) times daily.   gabapentin (NEURONTIN) 100 MG capsule Take 1 tablet four times daily, along with 300mg  tablet = 400mg  four times daily.   gabapentin (NEURONTIN) 300 MG capsule TAKE 2 CAPSULES (600 MG) AT 7AM, 2 CAPS AT 11AM, 2 CAPS AT 3PM & 3 CAPSULES (900 MG) AT BEDTIME   hyoscyamine (ANASPAZ) 0.125 MG TBDP disintergrating tablet Take by mouth as needed.   ibuprofen (ADVIL) 600 MG tablet Take 1 tablet (600 mg total) by mouth every 6 (six) hours as needed.   Multiple Vitamin (MULTIVITAMIN WITH MINERALS) TABS tablet Take 1 tablet by mouth daily.   nystatin ointment (MYCOSTATIN) Apply topically as needed. (Patient not taking: No sig reported)   potassium chloride (KLOR-CON) 10 MEQ tablet Take 2 tablets (20 mEq total) by mouth daily.   sacubitril-valsartan (ENTRESTO) 24-26 MG Take 1 tablet by mouth 2 (two) times daily.   No facility-administered encounter medications on file as of 01/27/2021.     Recent Relevant Labs: Lab  Results  Component Value Date/Time   HGBA1C 7.3 (A) 09/08/2020 11:58 AM    Kidney Function Lab Results  Component Value Date/Time   CREATININE 0.82 12/23/2020 03:04 PM   CREATININE 1.08 11/03/2020 01:50 PM   CREATININE 1.13 11/15/2011 01:25 PM   GFR 89.52 09/08/2020 12:22 PM   GFRNONAA >60 12/23/2020 03:04 PM    Current antihyperglycemic regimen: None  What diet changes have been made to improve diabetes control? Has not made changes to diet. States most foods make him sick on his stomach and do not taste good.   What recent interventions/DTPs have been made to improve glycemic control:  No recent interventions for DM. Patient missed last CCM appt. He did not tolerate [place].   Have there been any recent hospitalizations or ED visits since last visit with CPP? No  Patient denies hypoglycemic symptoms, including Pale, Sweaty, Shaky, Hungry, Nervous/irritable, and Vision changes  Patient denies hyperglycemic symptoms, including blurry vision, excessive thirst, fatigue, polyuria, and weakness  How often are you checking your blood sugar? once daily - if he checks.   What are your blood sugars ranging?  Does not keep track of his readings state they range anywhere from 125-180 all the time.   On insulin? No  During the week, how often does your blood glucose drop below 70? Never  Are you checking your feet daily/regularly? Yes- denies any wounds or sores on feet.   Adherence Review: Is the patient currently on a STATIN medication? No Is the patient  currently on ACE/ARB medication? Yes - in [place]  Does the patient have >5 day gap between last estimated fill dates?  No - gets Entresto through PAP  Care Gaps: Last annual wellness visit: New patient exam 09/08/20 Last eye exam / retinopathy screening: Patient unsure.  Last diabetic foot exam: 09/08/20  Star Rating Drugs:  Medication:  Last Fill: Day Supply Entresto 24-26 mg 12/09/20 30-  patient approved for patient  assistance 12/27/20.  No appointments scheduled within the next 30 days.  Rescheduled Kyle Ramsey telephone visit with Debbora Dus for 02/23/21 at 4:00 PM.  He was reminded to have all medications, supplements and any blood glucose and blood pressure readings available for review at appointment.  Follow-Up:  Pharmacist Review  Debbora Dus, CPP notified  Margaretmary Dys, [place] Assistant [phone]  I have reviewed the care management and care coordination activities outlined in this encounter and I am certifying that I agree with the content of this note. No further action required.  Debbora Dus, PharmD Clinical Pharmacist [facility] at [facility] [phone]

## 2021-01-30 ENCOUNTER — Other Ambulatory Visit: Payer: Self-pay | Admitting: Neurology

## 2021-01-30 DIAGNOSIS — G629 Polyneuropathy, unspecified: Secondary | ICD-10-CM

## 2021-02-20 ENCOUNTER — Telehealth: Payer: Self-pay

## 2021-02-20 ENCOUNTER — Ambulatory Visit: Payer: Medicare Other | Admitting: Neurology

## 2021-02-20 ENCOUNTER — Other Ambulatory Visit: Payer: Self-pay

## 2021-02-20 ENCOUNTER — Encounter: Payer: Self-pay | Admitting: Neurology

## 2021-02-20 VITALS — BP 179/88 | HR 68 | Ht 67.0 in | Wt 148.0 lb

## 2021-02-20 DIAGNOSIS — R202 Paresthesia of skin: Secondary | ICD-10-CM | POA: Diagnosis not present

## 2021-02-20 DIAGNOSIS — G959 Disease of spinal cord, unspecified: Secondary | ICD-10-CM | POA: Diagnosis not present

## 2021-02-20 MED ORDER — GABAPENTIN 300 MG PO CAPS: ORAL_CAPSULE | ORAL | 5 refills | Status: DC

## 2021-02-20 MED ORDER — DULOXETINE HCL 30 MG PO CPEP: 30.0000 mg | ORAL_CAPSULE | Freq: Every day | ORAL | 3 refills | Status: DC

## 2021-02-20 NOTE — Chronic Care Management (AMB) (Addendum)
    Chronic Care Management Pharmacy Assistant   Name: Kyle Ramsey  MRN: [id number] DOB: 08-Jul-1956   Reason for Encounter: Reminder Call   Conditions to be addressed/monitored: HTN and DMII   Medications: Outpatient Encounter Medications as of 02/20/2021  Medication Sig   budesonide (ENTOCORT EC) 3 MG 24 hr capsule Take 9 mg by mouth daily.   cholecalciferol (VITAMIN D3) 25 MCG (1000 UNIT) tablet Take 2,000 Units by mouth daily.   dapagliflozin propanediol (FARXIGA) 5 MG TABS tablet Take 1 tablet (5 mg total) by mouth daily before breakfast. (Patient not taking: Reported on 12/23/2020)   diphenoxylate-atropine (LOMOTIL) 2.5-0.025 MG tablet Take 1 tablet by mouth 4 (four) times daily.   furosemide (LASIX) 40 MG tablet Take 1 tablet (40 mg total) by mouth 2 (two) times daily.   gabapentin (NEURONTIN) 100 MG capsule Take 1 tablet four times daily, along with 300mg  tablet = 400mg  four times daily.   gabapentin (NEURONTIN) 300 MG capsule TAKE 2 CAPSULES (600 MG) AT 7AM, 2 CAPS AT 11AM, 2 CAPS AT 3PM & 3 CAPSULES (900 MG) AT BEDTIME   hyoscyamine (ANASPAZ) 0.125 MG TBDP disintergrating tablet Take by mouth as needed.   ibuprofen (ADVIL) 600 MG tablet Take 1 tablet (600 mg total) by mouth every 6 (six) hours as needed.   Multiple Vitamin (MULTIVITAMIN WITH MINERALS) TABS tablet Take 1 tablet by mouth daily.   nystatin ointment (MYCOSTATIN) Apply topically as needed. (Patient not taking: No sig reported)   potassium chloride (KLOR-CON) 10 MEQ tablet Take 2 tablets (20 mEq total) by mouth daily.   sacubitril-valsartan (ENTRESTO) 24-26 MG Take 1 tablet by mouth 2 (two) times daily.   No facility-administered encounter medications on file as of 02/20/2021.   [name]  was contacted to remind him of his upcoming telephone visit with Debbora Dus on 02/23/2021 at 4:00pm .  he was reminded to have all medications, supplements and any blood glucose and blood pressure readings available for  review at appointment.   The patient reports he is not taking the farxiga due to same intolerance as with metformin and he will not take it. He was unable to tolerate the medication due to upset stomach and has had this in the past which resulted in major weight loss and decline.The patient reports good report from the neurology recently and happy with that  Star Rating Drugs: Medication:  Last Fill: Day Supply Entresto 24-26mg  12/09/20 30 Farxiga 5 mg  10/12/20  30   CCM appointment on 02/23/21 and Cardiology appointment on 03/06/2021   Debbora Dus, CPP notified  Avel Sensor, [place] Assistant [phone]  I have reviewed the care management and care coordination activities outlined in this encounter and I am certifying that I agree with the content of this note. No further action required.  Debbora Dus, PharmD Clinical Pharmacist [facility] at [facility] [phone]

## 2021-02-20 NOTE — Patient Instructions (Addendum)
Start Cymbalta 30mg  daily  Continue gabapentin   Return to clinic in 4 months

## 2021-02-20 NOTE — Progress Notes (Signed)
Follow-up Visit   Date: 02/20/21   Kyle Ramsey MRN: [id number] DOB: 11-30-1955   Interim History: Kyle Ramsey is a 65 y.o. right-handed Caucasian male with diabetes mellitus, alcohol use, heart failure, and hypertension  returning to the clinic for follow-up of painful paresthesias and leg weakness.  The patient was accompanied to the clinic by self.  History of present illness: Patient established care with a PCP in May 2021 after turning 64 and getting medicare.  Prior to this, he did not have any regular medical evaluation.  He was diagnosed with diabetes mellitus with HbA1c 11, which has steadily improved with medication.  He tells me that after he was started on antiglycemic medication, his left leg became weak where he was unable to raise it, such as when squatting or climbing stairs.  This slowly started to also involve the right leg.  Around the same time, he began having burning, sharp pain in thighs, lower legs, and over the past month, it has extended into his abdomen.  He takes gabapentin 100-300mg  every 4 hours (day and night), which he self-adjusted.  His primary complaint is bilateral leg pain.  He also has 40lb weight loss.  EGD and colonoscopy has been normal. He drinks 4-5 beers nightly x 50 years.  UPDATE 02/20/2021:  He is here for follow-up visit.  At his last visit, his exam was concerning for myelopathy and he underwent extensive imaging of the neuroaxis, which did not show any compressive spinal cord or intracranial pathology. He underwent hernia surgery which significantly improved his leg paresthesias and weakness, but then the burning pain has moved into his abdomen and chest. He is eating much better and gained 25+ lb.  He is very pleased at the resolution of pain in this legs.  He continues to have skin sensitivity of his lower abdomen, scrotum, and chest. This sensitivity is always triggered by any food consumption.   No new weakness.  He takes gabapentin  600mg  every 4-5 hours and 900mg  at 10p. He does not wake up at night to take the medication any more.    Medications:  Current Outpatient Medications on File Prior to Visit  Medication Sig Dispense Refill   budesonide (ENTOCORT EC) 3 MG 24 hr capsule Take 9 mg by mouth daily.     cholecalciferol (VITAMIN D3) 25 MCG (1000 UNIT) tablet Take 2,000 Units by mouth daily.     dapagliflozin propanediol (FARXIGA) 5 MG TABS tablet Take 1 tablet (5 mg total) by mouth daily before breakfast. (Patient not taking: Reported on 12/23/2020) 30 tablet 1   diphenoxylate-atropine (LOMOTIL) 2.5-0.025 MG tablet Take 1 tablet by mouth 4 (four) times daily.     furosemide (LASIX) 40 MG tablet Take 1 tablet (40 mg total) by mouth 2 (two) times daily. 60 tablet 3   gabapentin (NEURONTIN) 100 MG capsule Take 1 tablet four times daily, along with 300mg  tablet = 400mg  four times daily. 270 capsule 1   gabapentin (NEURONTIN) 300 MG capsule TAKE 2 CAPSULES (600 MG) AT 7AM, 2 CAPS AT 11AM, 2 CAPS AT 3PM & 3 CAPSULES (900 MG) AT BEDTIME 270 capsule 0   hyoscyamine (ANASPAZ) 0.125 MG TBDP disintergrating tablet Take by mouth as needed.     ibuprofen (ADVIL) 600 MG tablet Take 1 tablet (600 mg total) by mouth every 6 (six) hours as needed. 30 tablet 0   Multiple Vitamin (MULTIVITAMIN WITH MINERALS) TABS tablet Take 1 tablet by mouth daily.     nystatin  ointment (MYCOSTATIN) Apply topically as needed. (Patient not taking: No sig reported)     potassium chloride (KLOR-CON) 10 MEQ tablet Take 2 tablets (20 mEq total) by mouth daily. 60 tablet 5   sacubitril-valsartan (ENTRESTO) 24-26 MG Take 1 tablet by mouth 2 (two) times daily. 60 tablet 3   No current facility-administered medications on file prior to visit.    Allergies:  Allergies  Allergen Reactions   Losartan Shortness Of Breath   Azithromycin Other (See Comments)   Codeine Nausea And Vomiting   Erythromycin Other (See Comments)    Gall bladder problem   Glipizide  Other (See Comments)   Metformin Hcl Er Other (See Comments)   Onion Other (See Comments)    Vital Signs:  BP (!) 179/88   Pulse 68   Wt 148 lb (67.1 kg)   BMI 23.18 kg/m     Neurological Exam: MENTAL STATUS including orientation to time, place, person, recent and remote memory, attention span and concentration, language, and fund of knowledge is normal.  Speech is not dysarthric.  CRANIAL NERVES:  No visual field defects.  Pupils equal round and reactive to light.  Normal conjugate, extra-ocular eye movements in all directions of gaze.  No ptosis.  Face is symmetric. Palate elevates symmetrically.  Tongue is midline.  MOTOR:  Motor strength is 5/5 in all extremities, including bilateral hip flexors (improved).  No atrophy, fasciculations or abnormal movements.  No pronator drift.  Tone is normal.    MSRs:  Reflexes are 2+/4 throughout.  Plantars are down going. .  SENSORY:  Intact to vibration throughout.  COORDINATION/GAIT:  Normal finger-to- nose-finger.  Intact rapid alternating movements bilaterally.  Gait narrow based and stable. Unassisted  Data: MRI brain wwo contrast 09/14/2020: Small amount of T2 hyperintense lesions of the white matter, nonspecific, may be related to chronic microvascular ischemic changes.  MRI cervical spine 08/11/2020: Examination limited by motion degradation.   Cervical spondylosis as described with findings most notably as follows.   At C4-C5, a shallow disc bulge contributes to mild relative spinal canal narrowing. No significant spinal canal stenosis at the remaining levels.   Multilevel neural foraminal narrowing greatest on the left at C3-C4 (moderate), bilaterally at C4-C5 (severe right, moderate left), on the left at C6-C7 (moderate) and bilaterally at C7-T1 (moderate right, moderate/severe left).   Disc degeneration is greatest at C6-C7 (moderate to moderately severe) and C7-T1 (moderate). Mild multilevel degenerative endplate edema.    Nonspecific edema signal within the interspinous spaces at the C4-C5 through T3-T4 levels.   MRI thoracic spine wo contrast 07/18/2020: 1. Normal MRI appearance of the thoracic spinal cord. No cord signal changes to suggest myelopathy. 2. Exaggeration of the normal thoracic kyphosis with associated mild multilevel degenerative disc desiccation and reactive endplate changes, most pronounced at T5-6 through T9-10. No significant canal or foraminal stenosis or evidence for neural impingement.   MR LUMBAR SPINE IMPRESSION: 1. Normal MRI appearance of the conus medullaris and nerve roots of the cauda equina. 2. Degenerative disc disease with reactive endplate change at J1-B1 with resultant moderate left worse than right L5 foraminal stenosis.  3. Disc bulge with facet hypertrophy at L4-5 with resultant mild canal and bilateral subarticular stenosis, with mild to moderate bilateral L4 foraminal narrowing.  4. Mild reactive marrow edema about the L4-5 and L5-S1 facets due to facet arthritis, which could contribute to underlying back pain.    IMPRESSION/PLAN: Myelopathy, resolved.  Imaging of the neuroaxis was reviewed and does not  show compressive spinal canal or intracranial pathology.  Likely nutritional deficiency, since symptoms improved following hernia surgery which lead to improved appetite and weight gain. Skin paresthesias, not consistent with neuropathy, however it seems to be responsive to gabapentin.  He is on a high dose of gabapentin which he takes 600mg  at 6a, 600mg  at 10a, 600mg  at 2p, 600mg  at 6p, and 900mg  at 10p = 3300mg /d. I will start him on Cymbalta 30mg  and see if this provides some relief of pain.   Return to clinic in 4 months.    Thank you for allowing me to participate in patient's care.  If I can answer any additional questions, I would be pleased to do so.    Sincerely,    Jasiel Apachito K. Posey Pronto, DO

## 2021-02-23 ENCOUNTER — Other Ambulatory Visit: Payer: Self-pay

## 2021-02-23 ENCOUNTER — Ambulatory Visit (INDEPENDENT_AMBULATORY_CARE_PROVIDER_SITE_OTHER): Payer: Medicare Other

## 2021-02-23 DIAGNOSIS — E1169 Type 2 diabetes mellitus with other specified complication: Secondary | ICD-10-CM | POA: Diagnosis not present

## 2021-02-23 DIAGNOSIS — I10 Essential (primary) hypertension: Secondary | ICD-10-CM | POA: Diagnosis not present

## 2021-02-23 NOTE — Progress Notes (Signed)
Chronic Care Management Pharmacy Note  02/23/21 Name:  Kyle Ramsey MRN:  [id number] DOB:  [birth year]  Subjective: Kyle Ramsey is an 65 y.o. year old male who is a primary patient of Cody, Jobe Marker, MD.  The CCM team was consulted for assistance with disease management and care coordination needs.    Engaged with patient by telephone for follow up visit in response to provider referral for pharmacy case management and/or care coordination services.   Consent to Services:  The patient was given information about Chronic Care Management services, agreed to services, and gave verbal consent prior to initiation of services.  Please see initial visit note for detailed documentation.   Patient Care Team: Lesleigh Noe, MD as PCP - General (Family Medicine) Debbora Dus, [facility] as Pharmacist (Pharmacist)  Recent office visits: Last CCM 10/11/20   Recent consult visits: 02/20/21 - Neuropathy - START Cymbalta 30 mg daily, continue gabapentin. Return in 4 months.  Hospital visits: 10/25/20 - ED Visit - Acute Heart Failure   Objective:  Lab Results  Component Value Date   CREATININE 0.82 12/23/2020   BUN 17 12/23/2020   GFR 89.52 09/08/2020   GFRNONAA >60 12/23/2020   NA 139 12/23/2020   K 4.4 12/23/2020   CALCIUM 9.1 12/23/2020   CO2 28 12/23/2020   GLUCOSE 126 (H) 12/23/2020   Lab Results  Component Value Date/Time   HGBA1C 7.3 (A) 09/08/2020 11:58 AM   GFR 89.52 09/08/2020 12:22 PM    Lab Results  Component Value Date   CHOL 171 09/08/2020   HDL 45.50 09/08/2020   LDLCALC 111 (H) 09/08/2020   TRIG 73.0 09/08/2020   CHOLHDL 4 09/08/2020   Hepatic Function Latest Ref Rng & Units 10/25/2020 09/08/2020 11/15/2011  Total Protein 6.5 - 8.1 g/dL 5.9(L) 6.0 6.7  Albumin 3.5 - 5.0 g/dL 3.3(L) 4.0 4.4  AST 15 - 41 U/L 14(L) 12 22  ALT 0 - 44 U/L $Remo'14 10 23  'fiIXv$ Alk Phosphatase 38 - 126 U/L 60 47 80  Total Bilirubin 0.3 - 1.2 mg/dL 0.9 1.2 1.5(H)  Bilirubin, Direct 0.0 - 0.2  mg/dL 0.1 - -   No results found for: TSH, FREET4  CBC Latest Ref Rng & Units 10/25/2020 09/08/2020 11/15/2011  WBC 4.0 - 10.5 K/uL 5.8 8.3 6.9  Hemoglobin 13.0 - 17.0 g/dL 10.8(L) 13.5 17.0  Hematocrit 39.0 - 52.0 % 31.3(L) 38.2(L) 50.1  Platelets 150 - 400 K/uL 219 200.0 -   No results found for: VD25OH  Clinical ASCVD: No  The 10-year ASCVD risk score Mikey Bussing DC Jr., et al., 2013) is: 54.7%   Values used to calculate the score:     Age: 22 years     Sex: Male     Is Non-Hispanic African American: No     Diabetic: Yes     Tobacco smoker: Yes     Systolic Blood Pressure: 782 mmHg     Is BP treated: Yes     HDL Cholesterol: 45.5 mg/dL     Total Cholesterol: 171 mg/dL    Depression screen PHQ 2/9 09/08/2020  Decreased Interest 0  Down, Depressed, Hopeless 0  PHQ - 2 Score 0    Social History   Tobacco Use  Smoking Status Every Day   Packs/day: 0.50   Years: 40.00   Pack years: 20.00   Types: Cigarettes  Smokeless Tobacco Never  Tobacco Comments   1 ppd 40 years, cut back to 0.5 ppd   BP Readings  from Last 3 Encounters:  02/20/21 (!) 179/88  12/23/20 (!) 171/92  12/09/20 (!) 154/86   Pulse Readings from Last 3 Encounters:  02/20/21 68  12/23/20 84  12/09/20 82   Wt Readings from Last 3 Encounters:  02/20/21 148 lb (67.1 kg)  12/23/20 142 lb 4 oz (64.5 kg)  12/09/20 141 lb (64 kg)   BMI Readings from Last 3 Encounters:  02/20/21 23.18 kg/m  12/23/20 22.28 kg/m  12/09/20 22.08 kg/m    Assessment/Interventions: Review of patient past medical history, allergies, medications, health status, including review of consultants reports, laboratory and other test data, was performed as part of comprehensive evaluation and provision of chronic care management services.   SDOH:  (Social Determinants of Health) assessments and interventions performed: Yes  SDOH Screenings   Alcohol Screen: Not on file  Depression (PHQ2-9): Low Risk    PHQ-2 Score: 0  Financial  Resource Strain: Medium Risk   Difficulty of Paying Living Expenses: Somewhat hard  Food Insecurity: Not on file  Housing: Not on file  Physical Activity: Not on file  Social Connections: Not on file  Stress: Not on file  Tobacco Use: High Risk   Smoking Tobacco Use: Every Day   Smokeless Tobacco Use: Never  Transportation Needs: Not on file    [place]  Allergies  Allergen Reactions   Losartan Shortness Of Breath   Azithromycin Other (See Comments)   Codeine Nausea And Vomiting   Erythromycin Other (See Comments)    Gall bladder problem   Glipizide Other (See Comments)   Metformin Hcl Er Other (See Comments)   Onion Other (See Comments)    Medications Reviewed Today     Reviewed by Debbora Dus, [facility] (Pharmacist) on 03/12/21 at [place] List Status: <None>   Medication Order Taking? Sig Documenting Provider Last Dose Status Informant  budesonide (ENTOCORT EC) 3 MG 24 hr capsule [id number]  Take 9 mg by mouth daily. [provider]  Active   cholecalciferol (VITAMIN D3) 25 MCG (1000 UNIT) tablet [id number] No Take 2,000 Units by mouth daily.  Patient not taking: Reported on 02/23/2021   [provider] Not Taking Active   diphenoxylate-atropine (LOMOTIL) 2.5-0.025 MG tablet [id number]  Take 1 tablet by mouth 4 (four) times daily. [provider]  Active   DULoxetine (CYMBALTA) 30 MG capsule [id number] No Take 1 capsule (30 mg total) by mouth daily.  Patient not taking: Reported on 02/23/2021   Alda Berthold, DO Not Taking Active   furosemide (LASIX) 40 MG tablet [id number] Yes Take 1 tablet (40 mg total) by mouth 2 (two) times daily. Alisa Graff, FNP Taking Active   gabapentin (NEURONTIN) 300 MG capsule [id number] Yes TAKE 2 CAPSULES (600 MG) AT 7AM, 2 CAPS AT 11AM, 2 CAPS AT 3PM & 3 CAPSULES (900 MG) AT BEDTIME Narda Amber K, DO Taking Active   hyoscyamine (ANASPAZ) 0.125 MG TBDP disintergrating tablet [id number] No Take by mouth as needed.   Patient not taking: Reported on 02/23/2021   [provider] Not Taking Active   ibuprofen (ADVIL) 600 MG tablet [id number] Yes Take 1 tablet (600 mg total) by mouth every 6 (six) hours as needed. Chase Picket, MD Taking Active   Multiple Vitamin (MULTIVITAMIN WITH MINERALS) TABS tablet [id number] Yes Take 1 tablet by mouth daily. [provider] Taking Active   potassium chloride (KLOR-CON) 10 MEQ tablet [id number] Yes Take 2 tablets (20 mEq total) by mouth daily. [address] Tina A,  FNP Taking Expired 02/23/21 2359   sacubitril-valsartan (ENTRESTO) 24-26 MG [id number] Yes Take 1 tablet by mouth 2 (two) times daily. Alisa Graff, FNP Taking Active             Patient Active Problem List   Diagnosis Date Noted   Atrial bigeminy 09/08/2020   Lymphocytic colitis 09/08/2020   Essential hypertension 09/08/2020   Neuropathy 09/08/2020   Cataract 09/08/2020   Myopathy 09/08/2020   Tobacco abuse 09/08/2020   Aortic atherosclerosis ([place]) 09/08/2020   Bilateral inguinal hernia, without obstruction or gangrene, not specified as recurrent 06/27/2020   Type 2 diabetes mellitus with other specified complication ([place]) 72/62/0355    Immunization History  Administered Date(s) Administered   PFIZER(Purple Top)SARS-COV-2 Vaccination 11/19/2019, 12/26/2019    Conditions to be addressed/monitored:  Hypertension and Diabetes  Care Plan : [place]  Updates made by Debbora Dus, [place] since 03/12/2021 12:00 AM     Problem: CHL AMB "PATIENT-SPECIFIC PROBLEM"      Long-Range Goal: Disease Management   Start Date: 10/12/2020  Priority: High  Note:   Current Barriers:  Unable to achieve control of blood pressure, diabetes    Pharmacist Clinical Goal(s):  Patient will contact provider office for questions/concerns as evidenced notation of same in electronic health record through collaboration with PharmD and provider.   Interventions: 1:1 collaboration with  Lesleigh Noe, MD regarding development and update of comprehensive plan of care as evidenced by provider attestation and co-signature Inter-disciplinary care team collaboration (see longitudinal plan of care) Comprehensive medication review performed; medication list updated in electronic medical record  Hypertension/CHF (BP goal <140/90) -Uncontrolled - per clinic BP readings -Patient prefers to focus on one condition at a time. Right now focusing on CHF. We discussed considering increase in Entresto dose as he prepares for next CHF visit. He is very sensitive to medications. He will consider this prior to next visit. -Current treatment: HTN: None  CHF: Entresto 24-26 mg - 1 twice daily Furosemide 40 mg - 1 twice daily  (usually takes 1 in the morning, may take another before 2 PM) Potassium 10 mEq - 1 twice daily with furosemide -Medications previously tried: none reported  -Current home readings: Reports clinic BP is usually higher than home but not routinely checking at home. - He reports tolerating Entresto well. He is weighing daily - stable, swelling at night but improves during the day. Current weight 135 daily. SOB stable, improved. He tries to limit salt.  -Denies hypotensive/hypertensive symptoms -Educated on BP goals and benefits of medications for prevention of heart attack, stroke and kidney damage;  -Recommended discuss increase Entresto at next CHF visit. Check home BP a few days per week.   Diabetes (A1c goal <7%) -Not ideally controlled - per home BG readings, last A1c 7.3% 08/2020. He did not tolerate Farxiga or metformin. He reports super beets powder BID has helped a lot with sugars, blood pressure, neuropathy. Feels better on this.  -Current medications: None -Medications previously tried: [place], metformin   -Current home glucose readings fasting glucose: 150 yesterday, ranging 120-150. He feels best when BG is about 120.  post prandial glucose:  220-250s -Denies hypoglycemic/hyperglycemic symptoms -Current meal patterns: Attended diabetes education class and is aware of carbohydrates impact on BG. Avoids sweets. -Educated on A1c and blood sugar goals; -Counseled to check feet daily and get yearly eye exams -Counseled on diet and exercise extensively; Schedule diabetes visit with Dr. Einar Pheasant.  Patient Goals/Self-Care Activities Patient will:  -  take medications as prescribed check blood pressure at home 2-3 days per week, document, and provide at future appointments  Follow Up Plan: Telephone follow up appointment with care management team member scheduled for:  3 months; CMA call for BP logs in 1 month     Medication Assistance: None required.  Patient affirms current coverage meets needs.   Compliance/Adherence/Medication fill history: Care Gaps: Due for diabetes follow up - A1c, foot and eye exam due  Vaccines - Shingrix, TDAP, COVID-19 Booster, Pneumococcal Hepatitis C screening  Star-Rating Drugs: None identified   Patient's preferred pharmacy is:  [id number] - [place], [address] [place] [address] Phone: [phone] Fax: [phone]  Uses pill box? No - denies need   Care Plan and Follow Up Patient Decision:  Patient agrees to Care Plan and Follow-up.  Debbora Dus, PharmD Clinical Pharmacist [facility] at [facility] [phone]

## 2021-03-05 NOTE — Progress Notes (Deleted)
   Patient ID: Kyle Ramsey, male    DOB: [birth year], 65 y.o.   MRN: [id number]  HPI  Kyle Ramsey is a 65 y/o male with a history of DM, CKD, HTN, current tobacco use and chronic heart failure.   Echo report from 11/25/20 reviewed and showed an EF of 35-40% along with mild LAE and mild Kyle.   Was in the ED 10/25/20 due to leg swelling. CT scans negative for PE/ DVT or venous thrombosis in his abdomen. Given lasix/ potassium and released.    He presents today for a follow-up visit with a chief complaint of   Has tolerated entresto without known side effect but is adamant that he doesn't want to add another medication, titrate up entresto or be referred anywhere for anything else at this time. He admits that he's very skeptical of medications and that they tend to create more problems than help.   Past Medical History:  Diagnosis Date   CHF (congestive heart failure) ([place])    Diabetes mellitus ([place])    Hypertension    Kidney calculi    Past Surgical History:  Procedure Laterality Date   HERNIA REPAIR Bilateral 09/2020   Family History  Problem Relation Age of Onset   Diabetes Mother    Dementia Mother    Breast cancer Mother    Diabetes Father    Dementia Father    Social History   Tobacco Use   Smoking status: Every Day    Packs/day: 0.50    Years: 40.00    Pack years: 20.00    Types: Cigarettes   Smokeless tobacco: Never   Tobacco comments:    1 ppd 40 years, cut back to 0.5 ppd  Substance Use Topics   Alcohol use: Not Currently    Comment: nothing    Allergies  Allergen Reactions   Losartan Shortness Of Breath   Azithromycin Other (See Comments)   Codeine Nausea And Vomiting   Erythromycin Other (See Comments)    Gall bladder problem   Glipizide Other (See Comments)   Metformin Hcl Er Other (See Comments)   Onion Other (See Comments)    Review of Systems  Cardiovascular:  Negative for leg swelling.  Skin: Negative.   Neurological:  Positive for dizziness.      Physical Exam  Assessment & Plan:  1: Chronic heart failure with reduced ejection fraction- - NYHA class II - euvolemic today - weighing daily; reminded to call for an overnight weight gain of > 2 pounds or a weekly weight gain of > 5 pounds - weight 142.4 from last visit here 2 1/2 months ago - on GDMT of  - started on entresto 24/'26mg'$  at last visit; tolerating without known side effects; discussed titrating it today but he defers; not interested in starting anything else either at this time  - not adding salt  - BNP 10/31/20 was 459.0   2: HTN- - BP  - saw PCP Kyle Ramsey) 09/08/20 - BMP 11/03/20 reviewed and showed sodium 138, potassium 4.5, creatinine 1.08 and GFR >60  3: DM- - A1c 09/08/20 was 7.3%   4: Tobacco use- - current tobacco use of 1/2 ppd - cessation discussed for 3 minutes - no longer drinking alcohol   Patient did not bring his medications nor a list. Each medication was verbally reviewed with the patient and he was encouraged to bring the bottles to every visit to confirm accuracy of list.

## 2021-03-06 ENCOUNTER — Ambulatory Visit: Payer: Medicare Other | Admitting: Family

## 2021-03-07 DIAGNOSIS — H5703 Miosis: Secondary | ICD-10-CM | POA: Diagnosis not present

## 2021-03-07 DIAGNOSIS — H25811 Combined forms of age-related cataract, right eye: Secondary | ICD-10-CM | POA: Diagnosis not present

## 2021-03-12 NOTE — Patient Instructions (Signed)
Dear [name],  Below is a summary of the goals we discussed during our follow up appointment on February 23, 2021. Please contact me anytime with questions or concerns.   Visit Information  Patient Care Plan: CCM Pharmacy Care Plan     Problem Identified: CHL AMB "PATIENT-SPECIFIC PROBLEM"      Long-Range Goal: Disease Management   Start Date: 10/12/2020  Priority: High  Note:   Current Barriers:  Unable to achieve control of blood pressure, diabetes    Pharmacist Clinical Goal(s):  Patient will contact provider office for questions/concerns as evidenced notation of same in electronic health record through collaboration with PharmD and provider.   Interventions: 1:1 collaboration with Lesleigh Noe, MD regarding development and update of comprehensive plan of care as evidenced by provider attestation and co-signature Inter-disciplinary care team collaboration (see longitudinal plan of care) Comprehensive medication review performed; medication list updated in electronic medical record  Hypertension/CHF (BP goal <140/90) -Uncontrolled - per clinic BP readings -Patient prefers to focus on one condition at a time. Right now focusing on CHF. We discussed considering increase in Entresto dose as he prepares for next CHF visit. He is very sensitive to medications. He will consider this prior to next visit. -Current treatment: HTN: None  CHF: Entresto 24-26 mg - 1 twice daily Furosemide 40 mg - 1 twice daily  (usually takes 1 in the morning, may take another before 2 PM) Potassium 10 mEq - 1 twice daily with furosemide -Medications previously tried: none reported  -Current home readings: Reports clinic BP is usually higher than home but not routinely checking at home. - He reports tolerating Entresto well. He is weighing daily - stable, swelling at night but improves during the day. Current weight 135 daily. SOB stable, improved. He tries to limit salt.  -Denies  hypotensive/hypertensive symptoms -Educated on BP goals and benefits of medications for prevention of heart attack, stroke and kidney damage;  -Recommended discuss increase Entresto at next CHF visit. Check home BP a few days per week.   Diabetes (A1c goal <7%) -Not ideally controlled - per home BG readings, last A1c 7.3% 08/2020. He did not tolerate Farxiga or metformin. He reports super beets powder BID has helped a lot with sugars, blood pressure, neuropathy. Feels better on this.  -Current medications: None -Medications previously tried: [place], metformin   -Current home glucose readings fasting glucose: 150 yesterday, ranging 120-150. He feels best when BG is about 120.  post prandial glucose: 220-250s -Denies hypoglycemic/hyperglycemic symptoms -Current meal patterns: Attended diabetes education class and is aware of carbohydrates impact on BG. Avoids sweets. -Educated on A1c and blood sugar goals; -Counseled to check feet daily and get yearly eye exams -Counseled on diet and exercise extensively; Schedule diabetes visit with Dr. Einar Pheasant.  Patient Goals/Self-Care Activities Patient will:  - take medications as prescribed check blood pressure at home 2-3 days per week, document, and provide at future appointments  Follow Up Plan: Telephone follow up appointment with care management team member scheduled for:  3 months; CMA call for BP logs in 1 month      Patient verbalizes understanding of instructions provided today and agrees to view in [place].   Debbora Dus, PharmD Clinical Pharmacist [facility] at [facility] [phone]

## 2021-03-16 DIAGNOSIS — H268 Other specified cataract: Secondary | ICD-10-CM | POA: Diagnosis not present

## 2021-03-16 DIAGNOSIS — H25811 Combined forms of age-related cataract, right eye: Secondary | ICD-10-CM | POA: Diagnosis not present

## 2021-03-22 ENCOUNTER — Ambulatory Visit: Payer: Medicare Other | Attending: Family | Admitting: Family

## 2021-03-22 ENCOUNTER — Other Ambulatory Visit: Payer: Self-pay

## 2021-03-22 ENCOUNTER — Encounter: Payer: Self-pay | Admitting: Family

## 2021-03-22 VITALS — BP 163/91 | HR 84 | Resp 16 | Ht 67.0 in | Wt 146.0 lb

## 2021-03-22 DIAGNOSIS — E1122 Type 2 diabetes mellitus with diabetic chronic kidney disease: Secondary | ICD-10-CM | POA: Insufficient documentation

## 2021-03-22 DIAGNOSIS — F17210 Nicotine dependence, cigarettes, uncomplicated: Secondary | ICD-10-CM | POA: Diagnosis not present

## 2021-03-22 DIAGNOSIS — I5022 Chronic systolic (congestive) heart failure: Secondary | ICD-10-CM | POA: Insufficient documentation

## 2021-03-22 DIAGNOSIS — Z720 Tobacco use: Secondary | ICD-10-CM

## 2021-03-22 DIAGNOSIS — Z79899 Other long term (current) drug therapy: Secondary | ICD-10-CM | POA: Diagnosis not present

## 2021-03-22 DIAGNOSIS — Z7952 Long term (current) use of systemic steroids: Secondary | ICD-10-CM | POA: Diagnosis not present

## 2021-03-22 DIAGNOSIS — Z881 Allergy status to other antibiotic agents status: Secondary | ICD-10-CM | POA: Diagnosis not present

## 2021-03-22 DIAGNOSIS — Z885 Allergy status to narcotic agent status: Secondary | ICD-10-CM | POA: Insufficient documentation

## 2021-03-22 DIAGNOSIS — I130 Hypertensive heart and chronic kidney disease with heart failure and stage 1 through stage 4 chronic kidney disease, or unspecified chronic kidney disease: Secondary | ICD-10-CM | POA: Diagnosis not present

## 2021-03-22 DIAGNOSIS — I10 Essential (primary) hypertension: Secondary | ICD-10-CM

## 2021-03-22 DIAGNOSIS — Z791 Long term (current) use of non-steroidal anti-inflammatories (NSAID): Secondary | ICD-10-CM | POA: Diagnosis not present

## 2021-03-22 DIAGNOSIS — Z833 Family history of diabetes mellitus: Secondary | ICD-10-CM | POA: Insufficient documentation

## 2021-03-22 DIAGNOSIS — N189 Chronic kidney disease, unspecified: Secondary | ICD-10-CM | POA: Diagnosis not present

## 2021-03-22 DIAGNOSIS — E1169 Type 2 diabetes mellitus with other specified complication: Secondary | ICD-10-CM

## 2021-03-22 NOTE — Patient Instructions (Signed)
Continue weighing daily and call for an overnight weight gain of > 2 pounds or a weekly weight gain of >5 pounds. 

## 2021-03-22 NOTE — Progress Notes (Signed)
Patient ID: Kyle Ramsey, male    DOB: January 28, 1956, 65 y.o.   MRN: [id number]  HPI  Mr Olbera is a 65 y/o male with a history of DM, CKD, HTN, current tobacco use and chronic heart failure.   Echo report from 11/25/20 reviewed and showed an EF of 35-40% along with mild LAE and mild MR.   Was in the ED 10/25/20 due to leg swelling. CT scans negative for PE/ DVT or venous thrombosis in his abdomen. Given lasix/ potassium and released.    He presents today for a follow-up visit with a chief complaint of minimal fatigue upon moderate exertion. He describes this as chronic in nature having been present for several years. He has associated dizziness, occasional edema at the end of the day and chronic pain along with this. He denies any difficulty sleeping, abdominal distention, palpitations, chest pain, shortness of breath, cough or weight gain.   Wants to make sure that I'm not planning on changing any of his medications. Reluctantly started taking cymbalta but he says that his pain has improved since he's been taking it.   Past Medical History:  Diagnosis Date   CHF (congestive heart failure) ([place])    Diabetes mellitus ([place])    Hypertension    Kidney calculi    Past Surgical History:  Procedure Laterality Date   HERNIA REPAIR Bilateral 09/2020   Family History  Problem Relation Age of Onset   Diabetes Mother    Dementia Mother    Breast cancer Mother    Diabetes Father    Dementia Father    Social History   Tobacco Use   Smoking status: Every Day    Packs/day: 0.50    Years: 40.00    Pack years: 20.00    Types: Cigarettes   Smokeless tobacco: Never   Tobacco comments:    1 ppd 40 years, cut back to 0.5 ppd  Substance Use Topics   Alcohol use: Not Currently    Comment: nothing    Allergies  Allergen Reactions   Losartan Shortness Of Breath   Azithromycin Other (See Comments)   Codeine Nausea And Vomiting   Erythromycin Other (See Comments)    Gall bladder problem    Glipizide Other (See Comments)   Metformin Hcl Er Other (See Comments)   Onion Other (See Comments)   Prior to Admission medications   Medication Sig Start Date End Date Taking? Authorizing Provider  DULoxetine (CYMBALTA) 30 MG capsule Take 1 capsule (30 mg total) by mouth daily. 02/20/21  Yes Patel, Donika K, DO  furosemide (LASIX) 40 MG tablet Take 1 tablet (40 mg total) by mouth 2 (two) times daily. 11/03/20  Yes Jakirah Zaun, Otila Kluver A, FNP  gabapentin (NEURONTIN) 300 MG capsule TAKE 2 CAPSULES (600 MG) AT 7AM, 2 CAPS AT 11AM, 2 CAPS AT 3PM & 3 CAPSULES (900 MG) AT BEDTIME 02/20/21  Yes Patel, Donika K, DO  ibuprofen (ADVIL) 600 MG tablet Take 1 tablet (600 mg total) by mouth every 6 (six) hours as needed. 06/14/19  Yes Lamptey, Myrene Galas, MD  Multiple Vitamin (MULTIVITAMIN WITH MINERALS) TABS tablet Take 1 tablet by mouth daily.   Yes [provider]  potassium chloride (KLOR-CON) 10 MEQ tablet Take 2 tablets (20 mEq total) by mouth daily. 10/28/20  Yes Jaekwon Mcclune A, FNP  sacubitril-valsartan (ENTRESTO) 24-26 MG Take 1 tablet by mouth 2 (two) times daily. 12/09/20  Yes Dannika Hilgeman A, FNP  budesonide (ENTOCORT EC) 3 MG 24 hr capsule  Take 9 mg by mouth daily. Patient not taking: Reported on 03/22/2021    [provider]    Review of Systems  Constitutional:  Positive for fatigue. Negative for appetite change.  HENT:  Negative for congestion, postnasal drip and sore throat.   Eyes: Negative.   Respiratory:  Negative for cough and shortness of breath.   Cardiovascular:  Negative for chest pain, palpitations and leg swelling.  Gastrointestinal:  Negative for abdominal distention and abdominal pain.  Endocrine: Negative.   Genitourinary: Negative.   Musculoskeletal:  Positive for arthralgias (leg pain improving) and back pain.  Skin: Negative.   Allergic/Immunologic: Negative.   Neurological:  Positive for dizziness. Negative for light-headedness.  Hematological:  Negative for  adenopathy. Does not bruise/bleed easily.  Psychiatric/Behavioral:  Negative for dysphoric mood and sleep disturbance. The patient is nervous/anxious.    Vitals:   03/22/21 1345  BP: (!) 163/91  Pulse: 84  Resp: 16  SpO2: 99%  Weight: 146 lb (66.2 kg)  Height: '5\' 7"'$  (1.702 m)   Wt Readings from Last 3 Encounters:  03/22/21 146 lb (66.2 kg)  02/20/21 148 lb (67.1 kg)  12/23/20 142 lb 4 oz (64.5 kg)   Lab Results  Component Value Date   CREATININE 0.82 12/23/2020   CREATININE 1.08 11/03/2020   CREATININE 0.89 10/31/2020   Physical Exam Vitals and nursing note reviewed.  Constitutional:      Appearance: Normal appearance.  HENT:     Head: Normocephalic and atraumatic.  Cardiovascular:     Rate and Rhythm: Normal rate and regular rhythm.  Pulmonary:     Effort: Pulmonary effort is normal. No respiratory distress.     Breath sounds: No wheezing or rales.  Abdominal:     Palpations: Abdomen is soft.     Tenderness: There is no abdominal tenderness.  Musculoskeletal:        General: No tenderness.     Cervical back: Normal range of motion and neck supple.     Right lower leg: No edema.     Left lower leg: No edema.  Skin:    General: Skin is warm and dry.  Neurological:     General: No focal deficit present.     Mental Status: He is alert and oriented to person, place, and time.  Psychiatric:        Mood and Affect: Mood normal.        Behavior: Behavior normal.    Assessment & Plan:  1: Chronic heart failure with reduced ejection fraction- - NYHA class II - euvolemic today - weighing daily; reminded to call for an overnight weight gain of > 2 pounds or a weekly weight gain of > 5 pounds - weight up 4 pounds from last visit here 2 1/2 months ago - on GDMT of entresto; not interested in starting anything else either at this time or titrating up entresto - not adding salt  - BNP 10/31/20 was 459.0  2: HTN- - BP elevated today (163/91); not wanting or interested  in starting anything; "it's always high at medical appointments" - saw PCP Einar Pheasant) 09/08/20 - BMP 11/03/20 reviewed and showed sodium 138, potassium 4.5, creatinine 1.08 and GFR >60  3: DM- - A1c 09/08/20 was 7.3%  - glucose runs between 120-160  4: Tobacco use- - current tobacco use of 1/2 ppd - cessation discussed for 3 minutes - no longer drinking alcohol   Patient did not bring his medications nor a list. Each medication was  verbally reviewed with the patient and he was encouraged to bring the bottles to every visit to confirm accuracy of list.  Return in 3 months or sooner for any questions/problems before then.

## 2021-04-05 ENCOUNTER — Telehealth: Payer: Self-pay

## 2021-04-05 NOTE — Chronic Care Management (AMB) (Addendum)
Chronic Care Management Pharmacy Assistant   Name: Kyle Ramsey  MRN: [id number] DOB: January 17, 1956   Reason for Encounter: HTN Review  Recent office visits:  None since  last CCM contact  Recent consult visits:  03/22/21 - Cardiology - Patient presented for follow up CHF.  BP elevated today (163/91).  He declines medication changes. Euvolemic  Patient to call if overnight weight gail > 2lbs,or weekly 5 lbs. Follow up 3 months.  Hospital visits:  None in previous 6 months  Medications: Outpatient Encounter Medications as of 04/05/2021  Medication Sig Note   budesonide (ENTOCORT EC) 3 MG 24 hr capsule Take 9 mg by mouth daily. (Patient not taking: Reported on 03/22/2021)    DULoxetine (CYMBALTA) 30 MG capsule Take 1 capsule (30 mg total) by mouth daily.    furosemide (LASIX) 40 MG tablet Take 1 tablet (40 mg total) by mouth 2 (two) times daily. 03/22/2021: Pt doesn't take every day   gabapentin (NEURONTIN) 300 MG capsule TAKE 2 CAPSULES (600 MG) AT 7AM, 2 CAPS AT 11AM, 2 CAPS AT 3PM & 3 CAPSULES (900 MG) AT BEDTIME    ibuprofen (ADVIL) 600 MG tablet Take 1 tablet (600 mg total) by mouth every 6 (six) hours as needed.    Multiple Vitamin (MULTIVITAMIN WITH MINERALS) TABS tablet Take 1 tablet by mouth daily.    potassium chloride (KLOR-CON) 10 MEQ tablet Take 2 tablets (20 mEq total) by mouth daily.    sacubitril-valsartan (ENTRESTO) 24-26 MG Take 1 tablet by mouth 2 (two) times daily.    No facility-administered encounter medications on file as of 04/05/2021.     Recent Office Vitals: BP Readings from Last 3 Encounters:  03/22/21 (!) 163/91  02/20/21 (!) 179/88  12/23/20 (!) 171/92   Pulse Readings from Last 3 Encounters:  03/22/21 84  02/20/21 68  12/23/20 84    Wt Readings from Last 3 Encounters:  03/22/21 146 lb (66.2 kg)  02/20/21 148 lb (67.1 kg)  12/23/20 142 lb 4 oz (64.5 kg)     Kidney Function Lab Results  Component Value Date/Time   CREATININE 0.82  12/23/2020 03:04 PM   CREATININE 1.08 11/03/2020 01:50 PM   CREATININE 1.13 11/15/2011 01:25 PM   GFR 89.52 09/08/2020 12:22 PM   GFRNONAA >60 12/23/2020 03:04 PM    BMP Latest Ref Rng & Units 12/23/2020 11/03/2020 10/31/2020  Glucose 70 - 99 mg/dL 126(H) 164(H) 137(H)  BUN 8 - 23 mg/dL '17 18 13  '$ Creatinine 0.61 - 1.24 mg/dL 0.82 1.08 0.89  Sodium 135 - 145 mmol/L 139 138 139  Potassium 3.5 - 5.1 mmol/L 4.4 4.5 3.7  Chloride 98 - 111 mmol/L 105 100 104  CO2 22 - 32 mmol/L '28 30 28  '$ Calcium 8.9 - 10.3 mg/dL 9.1 8.8(L) 8.5(L)    Contacted patient on 04/10/21 to discuss hypertension disease state.  Current antihypertensive regimen: No Pharmacotherapy (He is on Entresto for CHF).  The patient uses super beets powder daily and states his helps keep his BP within limits.  How often are you checking your Blood Pressure? infrequently Current home BP readings:   DATE:             BP               PULSE 03/15/21   142/82  64 03/22/21  144/86  76 03/28/21  154/81  80  Wrist or arm cuff: arm Caffeine intake: none Salt intake: limits table salt  OTC medications including  pseudoephedrine or NSAIDs? no Any readings above 180/120? No  What recent interventions/DTPs have been made by any provider to improve Blood Pressure control since last CPP Visit: none identified   Any recent hospitalizations or ED visits since last visit with CPP? No  What diet changes have been made to improve Blood Pressure Control?  Patient states he added a supplement called Super Beet powder that he mixes daily   What exercise is being done to improve your Blood Pressure Control?  None identified  Adherence Review: Is the patient currently on ACE/ARB medication? Yes Does the patient have >5 day gap between last estimated fill dates? CPP to review   Star Rating Drugs:  Medication:  Last Fill: Day Supply Entresto 24-26 mg      patient approved for patient assistance 12/27/20  Care Gaps:  None Last annual wellness  visit: 09/08/2020  Upcoming appts: Cardiology appointment on 06/28/21   Debbora Dus, CPP notified  Avel Sensor, [place] Assistant [phone]  I have reviewed the care management and care coordination activities outlined in this encounter and I am certifying that I agree with the content of this note. At last visit I encouraged considering Entresto dose titration prior to next cardiology visit for BP and CHF. Patient said he would consider. He declined changes at cardio visit last month. Will continue to review blood pressure and encourage dietary and lifestyle changes for blood pressure control - low salt intake, exercise, decreased stress, avoidance of NSAIDs.  Debbora Dus, PharmD Clinical Pharmacist [facility] at [facility] [phone]

## 2021-05-02 ENCOUNTER — Telehealth: Payer: Self-pay

## 2021-05-02 NOTE — Chronic Care Management (AMB) (Addendum)
    Chronic Care Management Pharmacy Assistant   Name: Nole Robey  MRN: [id number] DOB: [birth year]  Reason for Encounter: General Adherence   Recent office visits:  None since last CCM contact  Recent consult visits:  None since last CCM contact  Hospital visits:  None in previous 6 months  Medications: Outpatient Encounter Medications as of 05/02/2021  Medication Sig Note   budesonide (ENTOCORT EC) 3 MG 24 hr capsule Take 9 mg by mouth daily. (Patient not taking: Reported on 03/22/2021)    DULoxetine (CYMBALTA) 30 MG capsule Take 1 capsule (30 mg total) by mouth daily.    furosemide (LASIX) 40 MG tablet Take 1 tablet (40 mg total) by mouth 2 (two) times daily. 03/22/2021: Pt doesn't take every day   gabapentin (NEURONTIN) 300 MG capsule TAKE 2 CAPSULES (600 MG) AT 7AM, 2 CAPS AT 11AM, 2 CAPS AT 3PM & 3 CAPSULES (900 MG) AT BEDTIME    ibuprofen (ADVIL) 600 MG tablet Take 1 tablet (600 mg total) by mouth every 6 (six) hours as needed.    Multiple Vitamin (MULTIVITAMIN WITH MINERALS) TABS tablet Take 1 tablet by mouth daily.    potassium chloride (KLOR-CON) 10 MEQ tablet Take 2 tablets (20 mEq total) by mouth daily.    sacubitril-valsartan (ENTRESTO) 24-26 MG Take 1 tablet by mouth 2 (two) times daily.    No facility-administered encounter medications on file as of 05/02/2021.    Attempted contact with [name] 3 times on 05/02/21,05/03/21,05/05/21. Unsuccessful outreach. CCM visit next month.  Star Medications: Medication Name/mg Last Fill Days Supply Entresto 24-26 mg  12/09/20 30  Care Gaps: Annual wellness visit in last year? Yes  09/08/20 Most Recent BP reading: 163/91  03/22/21  If Diabetic: Most recent A1C reading:  7.3 09/08/20  CCM appointment on 05/30/21 and Cardiology appointment on 06/28/21  Debbora Dus, CPP notified  Avel Sensor, [place] Assistant [phone]  I have reviewed the care management and care coordination activities  outlined in this encounter and I am certifying that I agree with the content of this note. No further action required.  Debbora Dus, PharmD Clinical Pharmacist [facility] at [facility] [phone]

## 2021-05-23 ENCOUNTER — Telehealth: Payer: Self-pay

## 2021-05-23 NOTE — Chronic Care Management (AMB) (Signed)
    Chronic Care Management Pharmacy Assistant   Name: Kyle Ramsey  MRN: [id number] DOB: 26-Nov-1955  Reason for Encounter: Reminder Call   Conditions to be addressed/monitored: HTN and DMII    Medications: Outpatient Encounter Medications as of 05/23/2021  Medication Sig Note   budesonide (ENTOCORT EC) 3 MG 24 hr capsule Take 9 mg by mouth daily. (Patient not taking: Reported on 03/22/2021)    DULoxetine (CYMBALTA) 30 MG capsule Take 1 capsule (30 mg total) by mouth daily.    furosemide (LASIX) 40 MG tablet Take 1 tablet (40 mg total) by mouth 2 (two) times daily. 03/22/2021: Pt doesn't take every day   gabapentin (NEURONTIN) 300 MG capsule TAKE 2 CAPSULES (600 MG) AT 7AM, 2 CAPS AT 11AM, 2 CAPS AT 3PM & 3 CAPSULES (900 MG) AT BEDTIME    ibuprofen (ADVIL) 600 MG tablet Take 1 tablet (600 mg total) by mouth every 6 (six) hours as needed.    Multiple Vitamin (MULTIVITAMIN WITH MINERALS) TABS tablet Take 1 tablet by mouth daily.    potassium chloride (KLOR-CON) 10 MEQ tablet Take 2 tablets (20 mEq total) by mouth daily.    sacubitril-valsartan (ENTRESTO) 24-26 MG Take 1 tablet by mouth 2 (two) times daily.    No facility-administered encounter medications on file as of 05/23/2021.   [name] was contacted to remind him of his upcoming telephone visit with Debbora Dus on 05/30/21 at 1:00pm. Patient was reminded to have all medications, supplements and any blood glucose and blood pressure readings available for review at appointment.   Are you having any problems with your medications? No  Do you have any concerns you like to discuss with the pharmacist? No    Star Rating Drugs: Medication:  Last Fill: Day Supply Entresto 24-26 mg 12/09/20 [place], CPP notified  Avel Sensor, [place] Assistant [phone]  Total time spent for month CPA: 10 min.

## 2021-05-30 ENCOUNTER — Other Ambulatory Visit: Payer: Self-pay | Admitting: Family

## 2021-05-30 ENCOUNTER — Other Ambulatory Visit: Payer: Self-pay

## 2021-05-30 ENCOUNTER — Telehealth: Payer: Self-pay

## 2021-05-30 ENCOUNTER — Ambulatory Visit (INDEPENDENT_AMBULATORY_CARE_PROVIDER_SITE_OTHER): Payer: Medicare Other

## 2021-05-30 DIAGNOSIS — I10 Essential (primary) hypertension: Secondary | ICD-10-CM

## 2021-05-30 DIAGNOSIS — E1169 Type 2 diabetes mellitus with other specified complication: Secondary | ICD-10-CM

## 2021-05-30 MED ORDER — SACUBITRIL-VALSARTAN 49-51 MG PO TABS: 1.0000 | ORAL_TABLET | Freq: Two times a day (BID) | ORAL | 3 refills | Status: DC

## 2021-05-30 NOTE — Telephone Encounter (Signed)
Please have pt schedule DM f/u

## 2021-05-30 NOTE — Progress Notes (Signed)
Chronic Care Management Pharmacy Note  05/30/21 Name:  Kyle Ramsey MRN:  [id number] DOB:  [birth year]  Subjective: Kyle Ramsey is an 65 y.o. year old male who is a primary patient of Cody, Jobe Marker, MD.  The CCM team was consulted for assistance with disease management and care coordination needs.    Engaged with patient by telephone for follow up visit in response to provider referral for pharmacy case management and/or care coordination services.   Consent to Services:  The patient was given information about Chronic Care Management services, agreed to services, and gave verbal consent prior to initiation of services.  Please see initial visit note for detailed documentation.   Patient Care Team: Lesleigh Noe, MD as PCP - General (Family Medicine) Debbora Dus, [facility] as Pharmacist (Pharmacist)  Recent office visits: None since last CCM visit  Recent consult visits: 03/22/21 - Cardiology - Patient presented for follow up CHF.  BP elevated today (163/91).  He declines medication changes. Euvolemic  Patient to call if overnight weight gail > 2lbs,or weekly 5 lbs. Follow up 3 months. 02/20/21 - Neuropathy - START Cymbalta 30 mg daily, continue gabapentin. Return in 4 months.  Hospital visits: 10/25/20 - ED Visit - Acute Heart Failure   Objective:  Lab Results  Component Value Date   CREATININE 0.82 12/23/2020   BUN 17 12/23/2020   GFR 89.52 09/08/2020   GFRNONAA >60 12/23/2020   NA 139 12/23/2020   K 4.4 12/23/2020   CALCIUM 9.1 12/23/2020   CO2 28 12/23/2020   GLUCOSE 126 (H) 12/23/2020   Lab Results  Component Value Date/Time   HGBA1C 7.3 (A) 09/08/2020 11:58 AM   GFR 89.52 09/08/2020 12:22 PM    Lab Results  Component Value Date   CHOL 171 09/08/2020   HDL 45.50 09/08/2020   LDLCALC 111 (H) 09/08/2020   TRIG 73.0 09/08/2020   CHOLHDL 4 09/08/2020   Hepatic Function Latest Ref Rng & Units 10/25/2020 09/08/2020 11/15/2011  Total Protein 6.5 - 8.1 g/dL  5.9(L) 6.0 6.7  Albumin 3.5 - 5.0 g/dL 3.3(L) 4.0 4.4  AST 15 - 41 U/L 14(L) 12 22  ALT 0 - 44 U/L $Remo'14 10 23  'dzWnO$ Alk Phosphatase 38 - 126 U/L 60 47 80  Total Bilirubin 0.3 - 1.2 mg/dL 0.9 1.2 1.5(H)  Bilirubin, Direct 0.0 - 0.2 mg/dL 0.1 - -   No results found for: TSH, FREET4  CBC Latest Ref Rng & Units 10/25/2020 09/08/2020 11/15/2011  WBC 4.0 - 10.5 K/uL 5.8 8.3 6.9  Hemoglobin 13.0 - 17.0 g/dL 10.8(L) 13.5 17.0  Hematocrit 39.0 - 52.0 % 31.3(L) 38.2(L) 50.1  Platelets 150 - 400 K/uL 219 200.0 -   No results found for: VD25OH  Clinical ASCVD: No  The 10-year ASCVD risk score (Arnett DK, et al., 2019) is: 48.8%   Values used to calculate the score:     Age: 32 years     Sex: Male     Is Non-Hispanic African American: No     Diabetic: Yes     Tobacco smoker: Yes     Systolic Blood Pressure: 338 mmHg     Is BP treated: Yes     HDL Cholesterol: 45.5 mg/dL     Total Cholesterol: 171 mg/dL    Depression screen [facility] 2/9 03/22/2021 09/08/2020  Decreased Interest 0 0  Down, Depressed, Hopeless 0 0  PHQ - 2 Score 0 0    Social History   Tobacco Use  Smoking  Status Every Day   Packs/day: 0.50   Years: 40.00   Pack years: 20.00   Types: Cigarettes  Smokeless Tobacco Never  Tobacco Comments   1 ppd 40 years, cut back to 0.5 ppd   BP Readings from Last 3 Encounters:  03/22/21 (!) 163/91  02/20/21 (!) 179/88  12/23/20 (!) 171/92   Pulse Readings from Last 3 Encounters:  03/22/21 84  02/20/21 68  12/23/20 84   Wt Readings from Last 3 Encounters:  03/22/21 146 lb (66.2 kg)  02/20/21 148 lb (67.1 kg)  12/23/20 142 lb 4 oz (64.5 kg)   BMI Readings from Last 3 Encounters:  03/22/21 22.87 kg/m  02/20/21 23.18 kg/m  12/23/20 22.28 kg/m    Assessment/Interventions: Review of patient past medical history, allergies, medications, health status, including review of consultants reports, laboratory and other test data, was performed as part of comprehensive evaluation and  provision of chronic care management services.   SDOH:  (Social Determinants of Health) assessments and interventions performed: Yes SDOH Interventions    Flowsheet Row Most Recent Value  SDOH Interventions   Financial Strain Interventions Intervention Not Indicated      SDOH Screenings   Alcohol Screen: Not on file  Depression (PHQ2-9): Low Risk    PHQ-2 Score: 0  Financial Resource Strain: Low Risk    Difficulty of Paying Living Expenses: Not very hard  Food Insecurity: Not on file  Housing: Not on file  Physical Activity: Not on file  Social Connections: Not on file  Stress: Not on file  Tobacco Use: High Risk   Smoking Tobacco Use: Every Day   Smokeless Tobacco Use: Never  Transportation Needs: Not on file    [place]  Allergies  Allergen Reactions   Losartan Shortness Of Breath   Azithromycin Other (See Comments)   Codeine Nausea And Vomiting   Erythromycin Other (See Comments)    Gall bladder problem   Glipizide Other (See Comments)   Metformin Hcl Er Other (See Comments)   Onion Other (See Comments)    Medications Reviewed Today     Reviewed by Debbora Dus, [facility] (Pharmacist) on 05/30/21 at [place] List Status: <None>   Medication Order Taking? Sig Documenting Provider Last Dose Status Informant  budesonide (ENTOCORT EC) 3 MG 24 hr capsule [id number] Yes Take 9 mg by mouth daily. Patients take 1 tablet for three- four days when he has diarrhea (about once every month or two) 05/30/21 [provider] Taking Active Self  DULoxetine (CYMBALTA) 30 MG capsule [id number] Yes Take 1 capsule (30 mg total) by mouth daily. Narda Amber K, DO Taking Active   furosemide (LASIX) 40 MG tablet [id number] No Take 1 tablet (40 mg total) by mouth 2 (two) times daily.  Patient not taking: Reported on 05/30/2021   Alisa Graff, FNP Not Taking Active            Med Note Sharen Counter Mar 22, 2021  1:58 PM) Pt doesn't take every day  gabapentin  (NEURONTIN) 300 MG capsule [id number] Yes TAKE 2 CAPSULES (600 MG) AT 7AM, 2 CAPS AT 11AM, 2 CAPS AT 3PM & 3 CAPSULES (900 MG) AT BEDTIME Narda Amber K, DO Taking Active   ibuprofen (ADVIL) 600 MG tablet [id number]  Take 1 tablet (600 mg total) by mouth every 6 (six) hours as needed. Chase Picket, MD  Active   Multiple Vitamin (MULTIVITAMIN WITH MINERALS) TABS tablet [id number]  Take 1 tablet  by mouth daily. [provider]  Active   Omega-3 Fatty Acids (OMEGA 3 500 PO) [id number] Yes Take by mouth. Omega XL - daily for arthritis [provider] Taking Active Self  potassium chloride (KLOR-CON) 10 MEQ tablet [id number] No Take 2 tablets (20 mEq total) by mouth daily.  Patient not taking: Reported on 05/30/2021   Alisa Graff, FNP Not Taking Active   sacubitril-valsartan (ENTRESTO) 24-26 MG [id number] Yes Take 1 tablet by mouth 2 (two) times daily. Alisa Graff, FNP Taking Active             Patient Active Problem List   Diagnosis Date Noted   Atrial bigeminy 09/08/2020   Lymphocytic colitis 09/08/2020   Essential hypertension 09/08/2020   Neuropathy 09/08/2020   Cataract 09/08/2020   Myopathy 09/08/2020   Tobacco abuse 09/08/2020   Aortic atherosclerosis ([place]) 09/08/2020   Bilateral inguinal hernia, without obstruction or gangrene, not specified as recurrent 06/27/2020   Type 2 diabetes mellitus with other specified complication ([place]) 75/17/0017    Immunization History  Administered Date(s) Administered   PFIZER(Purple Top)SARS-COV-2 Vaccination 11/19/2019, 12/26/2019    Conditions to be addressed/monitored:  Hypertension and Diabetes  Care Plan : [place]  Updates made by Debbora Dus, [place] since 05/30/2021 12:00 AM     Problem: CHL AMB "PATIENT-SPECIFIC PROBLEM"      Long-Range Goal: Disease Management   Start Date: 10/12/2020  This Visit's Progress: On track  Priority: High  Note:    Current Barriers:  Elevated blood pressure  and blood glucose Multiple medication intolerances  Pharmacist Clinical Goal(s):  Patient will contact provider office for questions/concerns as evidenced notation of same in electronic health record through collaboration with PharmD and provider.   Interventions: 1:1 collaboration with Lesleigh Noe, MD regarding development and update of comprehensive plan of care as evidenced by provider attestation and co-signature Inter-disciplinary care team collaboration (see longitudinal plan of care) Comprehensive medication review performed; medication list updated in electronic medical record  Hypertension/CHF (BP goal <140/90) -Uncontrolled - per clinic BP readings -Pt has poor experience with medications and does not want to start new meds. We discussed increasing the Entresto dose would benefit his BP and CHF. He agrees to this pending cardiology approval. He reports tolerating Entresto well. His weight is very stable. He weights daily and limits salt. -Current treatment: HTN: None  CHF: Entresto 24-26 mg - 1 twice daily Furosemide 40 mg - 1 twice daily  (usually takes 1 in the morning, may take another before 2 PM) - not taking  Potassium 10 mEq - 1 twice daily with furosemide - not taking  -Medications previously tried: none reported  -Current home readings:  He states BP continues to run around 140-150 mmHg. Last checked about a month ago.  DATE:              BP               PULSE 03/15/21                          142/82             64 03/22/21                        144/86             76 03/28/21  154/81             80 -Denies hypotensive/hypertensive symptoms -Educated on BP goals and benefits of medications for prevention of heart attack, stroke and kidney damage;  -Recommended consider increasing Entresto dose (PAP). See telephone note to Darylene Price, NP.  Diabetes (A1c goal <7%) -Not ideally controlled - home BG readings remain mildly elevated. He has not  scheduled diabetes visit with PCP as recommended. Last A1c 7.3% 08/2020. -Current medications: None -Medications previously tried: [place], metformin   -Current home glucose readings - checking every 3 days or so  fasting glucose: still running 130-150  post prandial glucose: 180-250 -Denies hypoglycemic/hyperglycemic symptoms -Current meal patterns: Attended diabetes education class and is aware of carbohydrates impact on BG. Avoids sweets. -Educated on A1c and blood sugar goals; -Counseled to check feet daily and get yearly eye exams - foot and eye exam due  -Counseled on diet and exercise extensively; Schedule diabetes visit with Dr. Einar Pheasant and your eye exam.   Patient Goals/Self-Care Activities Patient will:  - take medications as prescribed check blood pressure at home 2-3 days per week, document, and provide at future appointments  Follow Up Plan: Telephone follow up appointment with care management team member scheduled for:   - 3 months    Medication Assistance: None required.  Patient affirms current coverage meets needs.   Compliance/Adherence/Medication fill history: Care Gaps: Due for diabetes follow up - A1c, foot and eye exam due  Vaccines - Shingrix, TDAP, COVID-19 Booster, Pneumococcal Hepatitis C screening  Star-Rating Drugs: None identified   Patient's preferred pharmacy is:  [id number] - [place], [address] [place] [address] Phone: [phone] Fax: [phone]  Uses pill box? No - denies need   Care Plan and Follow Up Patient Decision:  Patient agrees to Care Plan and Follow-up.  Debbora Dus, PharmD Clinical Pharmacist [facility] [phone]

## 2021-05-30 NOTE — Telephone Encounter (Signed)
Hello,   During CCM visit today we discussed Entresto titration/target dosing as recommend by you at last CHF visit. His home SBP continues to run 140s-150s. He is interested in trying the next dose of Entresto. He gets this through patient assistance and would prefer to start with a 30 day supply if you would like to proceed. He also mentioned he is very happy with the care you provide.  Thanks,  Debbora Dus, PharmD Clinical Pharmacist [facility] at [facility] [phone]

## 2021-05-30 NOTE — Telephone Encounter (Signed)
Left voice message to call the office  

## 2021-05-30 NOTE — Telephone Encounter (Signed)
Kyle Ramsey, this sounds great. Thank you.  ----  Ria Comment,   Can you please let [name] know new dose Delene Loll will come through assistance program. Only a 90 day supply is allowed. If he has issues with the new dose, we can cut in half.  Thanks!

## 2021-05-30 NOTE — Progress Notes (Signed)
New dose of entresto faxed to [employer] patient assistance

## 2021-05-30 NOTE — Patient Instructions (Signed)
Dear Kyle Ramsey,  Below is a summary of the goals we discussed during our follow up appointment on May 30, 2021. Please contact me anytime with questions or concerns.   Visit Information  Patient Care Plan: CCM Pharmacy Care Plan     Problem Identified: CHL AMB "PATIENT-SPECIFIC PROBLEM"      Long-Range Goal: Disease Management   Start Date: 10/12/2020  This Visit's Progress: On track  Priority: High  Note:    Current Barriers:  Elevated blood pressure and blood glucose Multiple medication intolerances  Pharmacist Clinical Goal(s):  Patient will contact provider office for questions/concerns as evidenced notation of same in electronic health record through collaboration with PharmD and provider.   Interventions: 1:1 collaboration with Lesleigh Noe, MD regarding development and update of comprehensive plan of care as evidenced by provider attestation and co-signature Inter-disciplinary care team collaboration (see longitudinal plan of care) Comprehensive medication review performed; medication list updated in electronic medical record  Hypertension/CHF (BP goal <140/90) -Uncontrolled - per clinic BP readings -Pt has poor experience with medications and does not want to start new meds. We discussed increasing the Entresto dose would benefit his BP and CHF. He agrees to this pending cardiology approval. He reports tolerating Entresto well. His weight is very stable. He weights daily and limits salt. -Current treatment: HTN: None  CHF: Entresto 24-26 mg - 1 twice daily Furosemide 40 mg - 1 twice daily  (usually takes 1 in the morning, may take another before 2 PM) - not taking  Potassium 10 mEq - 1 twice daily with furosemide - not taking  -Medications previously tried: none reported  -Current home readings:  He states BP continues to run around 140-150 mmHg. Last checked about a month ago.  DATE:              BP               PULSE 03/15/21                           142/82             64 03/22/21                        144/86             76 03/28/21                        154/81             80 -Denies hypotensive/hypertensive symptoms -Educated on BP goals and benefits of medications for prevention of heart attack, stroke and kidney damage;  -Recommended consider increasing Entresto dose (PAP). See telephone note to Darylene Price, NP.  Diabetes (A1c goal <7%) -Not ideally controlled - home BG readings remain mildly elevated. He has not scheduled diabetes visit with PCP as recommended. Last A1c 7.3% 08/2020. -Current medications: None -Medications previously tried: [place], metformin   -Current home glucose readings - checking every 3 days or so  fasting glucose: still running 130-150  post prandial glucose: 180-250 -Denies hypoglycemic/hyperglycemic symptoms -Current meal patterns: Attended diabetes education class and is aware of carbohydrates impact on BG. Avoids sweets. -Educated on A1c and blood sugar goals; -Counseled to check feet daily and get yearly eye exams - foot and eye exam due  -Counseled on diet and exercise extensively; Schedule diabetes visit with Dr. Einar Pheasant and  your eye exam.   Patient Goals/Self-Care Activities Patient will:  - take medications as prescribed check blood pressure at home 2-3 days per week, document, and provide at future appointments  Follow Up Plan: Telephone follow up appointment with care management team member scheduled for:   - 3 months      Patient verbalizes understanding of instructions provided today and agrees to view in [place].   Debbora Dus, PharmD Clinical Pharmacist [facility] at [facility] [phone]

## 2021-05-30 NOTE — Telephone Encounter (Signed)
Patient is due for diabetes follow up with PCP. Not sure the best person to contact but wanted to let you all know.  Lab Results  Component Value Date/Time   HGBA1C 7.3 (A) 09/08/2020 11:58 AM    Debbora Dus, PharmD Clinical Pharmacist [facility] at [facility] [phone]

## 2021-05-31 ENCOUNTER — Telehealth: Payer: Self-pay | Admitting: Family Medicine

## 2021-05-31 NOTE — Telephone Encounter (Signed)
Lvm to call office to schedule a f/u apt and a mychart letter

## 2021-06-01 NOTE — Progress Notes (Signed)
Attempted contact multiple times to inform patient new dose Delene Loll will come through assistance program. Only a 90 day supply is allowed. If he has issues with the new dose, we can cut in half. Unable to reach patient.   Debbora Dus, CPP notified  Margaretmary Dys, [facility] Assistant [phone]

## 2021-06-08 ENCOUNTER — Ambulatory Visit: Payer: Medicare Other | Admitting: Family Medicine

## 2021-06-12 DIAGNOSIS — I10 Essential (primary) hypertension: Secondary | ICD-10-CM

## 2021-06-12 DIAGNOSIS — E1169 Type 2 diabetes mellitus with other specified complication: Secondary | ICD-10-CM

## 2021-06-26 ENCOUNTER — Ambulatory Visit: Payer: Medicare Other | Admitting: Neurology

## 2021-06-28 ENCOUNTER — Ambulatory Visit (INDEPENDENT_AMBULATORY_CARE_PROVIDER_SITE_OTHER): Payer: Medicare Other | Admitting: Internal Medicine

## 2021-06-28 ENCOUNTER — Ambulatory Visit: Payer: Medicare Other | Attending: Family | Admitting: Family

## 2021-06-28 ENCOUNTER — Encounter: Payer: Self-pay | Admitting: Family

## 2021-06-28 ENCOUNTER — Encounter: Payer: Self-pay | Admitting: Internal Medicine

## 2021-06-28 ENCOUNTER — Other Ambulatory Visit: Payer: Self-pay

## 2021-06-28 VITALS — BP 138/86 | HR 77 | Temp 97.7°F | Ht 67.0 in | Wt 151.0 lb

## 2021-06-28 VITALS — BP 173/94 | HR 74 | Resp 18 | Ht 67.0 in | Wt 150.5 lb

## 2021-06-28 DIAGNOSIS — Z720 Tobacco use: Secondary | ICD-10-CM

## 2021-06-28 DIAGNOSIS — E1169 Type 2 diabetes mellitus with other specified complication: Secondary | ICD-10-CM | POA: Diagnosis not present

## 2021-06-28 DIAGNOSIS — E1142 Type 2 diabetes mellitus with diabetic polyneuropathy: Secondary | ICD-10-CM | POA: Diagnosis not present

## 2021-06-28 DIAGNOSIS — K52832 Lymphocytic colitis: Secondary | ICD-10-CM | POA: Diagnosis not present

## 2021-06-28 DIAGNOSIS — I130 Hypertensive heart and chronic kidney disease with heart failure and stage 1 through stage 4 chronic kidney disease, or unspecified chronic kidney disease: Secondary | ICD-10-CM | POA: Diagnosis not present

## 2021-06-28 DIAGNOSIS — E1122 Type 2 diabetes mellitus with diabetic chronic kidney disease: Secondary | ICD-10-CM | POA: Insufficient documentation

## 2021-06-28 DIAGNOSIS — I10 Essential (primary) hypertension: Secondary | ICD-10-CM | POA: Diagnosis not present

## 2021-06-28 DIAGNOSIS — I5022 Chronic systolic (congestive) heart failure: Secondary | ICD-10-CM | POA: Insufficient documentation

## 2021-06-28 DIAGNOSIS — Z79899 Other long term (current) drug therapy: Secondary | ICD-10-CM | POA: Diagnosis not present

## 2021-06-28 DIAGNOSIS — F17210 Nicotine dependence, cigarettes, uncomplicated: Secondary | ICD-10-CM | POA: Insufficient documentation

## 2021-06-28 DIAGNOSIS — N189 Chronic kidney disease, unspecified: Secondary | ICD-10-CM | POA: Diagnosis not present

## 2021-06-28 DIAGNOSIS — I700 Atherosclerosis of aorta: Secondary | ICD-10-CM

## 2021-06-28 DIAGNOSIS — Z23 Encounter for immunization: Secondary | ICD-10-CM | POA: Diagnosis not present

## 2021-06-28 LAB — HEPATIC FUNCTION PANEL
ALT: 12 U/L (ref 0–53)
AST: 14 U/L (ref 0–37)
Albumin: 4 g/dL (ref 3.5–5.2)
Alkaline Phosphatase: 58 U/L (ref 39–117)
Bilirubin, Direct: 0.2 mg/dL (ref 0.0–0.3)
Total Bilirubin: 1.2 mg/dL (ref 0.2–1.2)
Total Protein: 6.2 g/dL (ref 6.0–8.3)

## 2021-06-28 LAB — RENAL FUNCTION PANEL
Albumin: 4 g/dL (ref 3.5–5.2)
BUN: 15 mg/dL (ref 6–23)
CO2: 29 mEq/L (ref 19–32)
Calcium: 8.8 mg/dL (ref 8.4–10.5)
Chloride: 105 mEq/L (ref 96–112)
Creatinine, Ser: 1.04 mg/dL (ref 0.40–1.50)
GFR: 74.84 mL/min (ref >60.00)
Glucose, Bld: 86 mg/dL (ref 70–99)
Phosphorus: 3.1 mg/dL (ref 2.3–4.6)
Potassium: 3.5 mEq/L (ref 3.5–5.1)
Sodium: 141 mEq/L (ref 135–145)

## 2021-06-28 LAB — LIPID PANEL
Cholesterol: 178 mg/dL (ref 0–200)
HDL: 69.8 mg/dL (ref >39.00)
LDL Cholesterol: 101 mg/dL — ABNORMAL HIGH (ref 0–99)
NonHDL: 108.51
Total CHOL/HDL Ratio: 3
Triglycerides: 39 mg/dL (ref 0.0–149.0)
VLDL: 7.8 mg/dL (ref 0.0–40.0)

## 2021-06-28 LAB — HEMOGLOBIN A1C: Hgb A1c MFr Bld: 6.2 % (ref 4.6–6.5)

## 2021-06-28 LAB — CBC
HCT: 40.6 % (ref 39.0–52.0)
Hemoglobin: 13.7 g/dL (ref 13.0–17.0)
MCHC: 33.7 g/dL (ref 30.0–36.0)
MCV: 98.6 fl (ref 78.0–100.0)
Platelets: 183 10*3/uL (ref 150.0–400.0)
RBC: 4.11 Mil/uL — ABNORMAL LOW (ref 4.22–5.81)
RDW: 13.5 % (ref 11.5–15.5)
WBC: 6.8 10*3/uL (ref 4.0–10.5)

## 2021-06-28 LAB — HM DIABETES FOOT EXAM

## 2021-06-28 LAB — T4, FREE: Free T4: 0.74 ng/dL (ref 0.60–1.60)

## 2021-06-28 MED ORDER — DIPHENOXYLATE-ATROPINE 2.5-0.025 MG PO TABS: 1.0000 | ORAL_TABLET | Freq: Every day | ORAL | 0 refills | Status: DC | PRN

## 2021-06-28 MED ORDER — BUDESONIDE 3 MG PO CPEP: 9.0000 mg | ORAL_CAPSULE | Freq: Every day | ORAL | 11 refills | Status: DC

## 2021-06-28 NOTE — Addendum Note (Signed)
Addended by: Pilar Grammes on: 06/28/2021 01:16 PM   Modules accepted: Orders

## 2021-06-28 NOTE — Progress Notes (Signed)
Patient ID: Kyle Ramsey, male    DOB: [birth year], 65 y.o.   MRN: [id number]  Mr Drumwright is a 65 y/o male with a history of DM, CKD, HTN, current tobacco use and chronic heart failure.   Echo report from 11/25/20 reviewed and showed an EF of 35-40% along with mild LAE and mild MR.   Has not been admitted or been in the ED in the last 6 months.    He presents today with a chief complaint of a follow-up visit. Today, he has no complaints and states he has felt "great". He denies CP, SOB, palpitations, weight gain, dizziness, or syncope.   He weighs himself daily, denies any fluctuations in either direction.   Past Medical History:  Diagnosis Date   CHF (congestive heart failure) ([place])    Diabetes mellitus ([place])    Hypertension    Kidney calculi    Past Surgical History:  Procedure Laterality Date   HERNIA REPAIR Bilateral 09/2020   Family History  Problem Relation Age of Onset   Diabetes Mother    Dementia Mother    Breast cancer Mother    Diabetes Father    Dementia Father    Social History   Tobacco Use   Smoking status: Every Day    Packs/day: 0.50    Years: 40.00    Pack years: 20.00    Types: Cigarettes   Smokeless tobacco: Never   Tobacco comments:    1 ppd 40 years, cut back to 0.5 ppd  Substance Use Topics   Alcohol use: Not Currently    Comment: nothing    Allergies  Allergen Reactions   Losartan Shortness Of Breath   Azithromycin Other (See Comments)   Codeine Nausea And Vomiting   Erythromycin Other (See Comments)    Gall bladder problem   Glipizide Other (See Comments)   Metformin Hcl Er Other (See Comments)   Onion Other (See Comments)   Prior to Admission medications   Medication Sig Start Date End Date Taking? Authorizing Provider  DULoxetine (CYMBALTA) 30 MG capsule Take 1 capsule (30 mg total) by mouth daily. 02/20/21  Yes Patel, Donika K, DO  furosemide (LASIX) 40 MG tablet Take 1 tablet (40 mg total) by mouth 2 (two) times daily. 11/03/20   Yes Hackney, Otila Kluver A, FNP  gabapentin (NEURONTIN) 300 MG capsule TAKE 2 CAPSULES (600 MG) AT 7AM, 2 CAPS AT 11AM, 2 CAPS AT 3PM & 3 CAPSULES (900 MG) AT BEDTIME 02/20/21  Yes Patel, Donika K, DO  ibuprofen (ADVIL) 600 MG tablet Take 1 tablet (600 mg total) by mouth every 6 (six) hours as needed. 06/14/19  Yes Lamptey, Myrene Galas, MD  Multiple Vitamin (MULTIVITAMIN WITH MINERALS) TABS tablet Take 1 tablet by mouth daily.   Yes [provider]  potassium chloride (KLOR-CON) 10 MEQ tablet Take 2 tablets (20 mEq total) by mouth daily. 10/28/20  Yes Hackney, Tina A, FNP  sacubitril-valsartan (ENTRESTO) 24-26 MG Take 1 tablet by mouth 2 (two) times daily. 12/09/20  Yes Hackney, Tina A, FNP  budesonide (ENTOCORT EC) 3 MG 24 hr capsule Take 9 mg by mouth daily. Patient not taking: Reported on 03/22/2021    [provider]   Review of Systems  Constitutional:  Negative for appetite change and fatigue.  HENT:  Negative for congestion, postnasal drip and sore throat.   Eyes: Negative.   Respiratory:  Negative for cough and shortness of breath.   Cardiovascular:  Negative for chest pain, palpitations and  leg swelling.  Gastrointestinal:  Negative for abdominal distention and abdominal pain.  Endocrine: Negative.   Genitourinary: Negative.   Musculoskeletal:  Negative for arthralgias (leg pain improving) and back pain.  Skin: Negative.   Allergic/Immunologic: Negative.   Neurological:  Negative for dizziness and light-headedness.  Hematological:  Negative for adenopathy. Does not bruise/bleed easily.  Psychiatric/Behavioral:  Negative for dysphoric mood and sleep disturbance. The patient is nervous/anxious and is hyperactive.    Vitals:   06/28/21 1242 06/28/21 1248  BP: (!) 189/100 (!) 173/94  Pulse: 74   Resp: 18   SpO2: 97%   Weight: 150 lb 8 oz (68.3 kg)   Height: 5\' 7"  (1.702 m)    Wt Readings from Last 3 Encounters:  06/28/21 150 lb 8 oz (68.3 kg)  06/28/21 151 lb (68.5 kg)   03/22/21 146 lb (66.2 kg)    Lab Results  Component Value Date   CREATININE 0.82 12/23/2020   CREATININE 1.08 11/03/2020   CREATININE 0.89 10/31/2020   Physical Exam Vitals and nursing note reviewed.  Constitutional:      General: He is not in acute distress.    Appearance: Normal appearance.  HENT:     Head: Normocephalic and atraumatic.  Neck:     Vascular: No carotid bruit.  Cardiovascular:     Rate and Rhythm: Normal rate and regular rhythm.     Pulses: Normal pulses.     Heart sounds: No murmur heard. Pulmonary:     Effort: Pulmonary effort is normal. No respiratory distress.     Breath sounds: No wheezing or rales.  Abdominal:     Palpations: Abdomen is soft.     Tenderness: There is no abdominal tenderness.  Musculoskeletal:        General: No tenderness.     Cervical back: Normal range of motion and neck supple.     Right lower leg: No edema.     Left lower leg: No edema.  Skin:    General: Skin is warm and dry.  Neurological:     General: No focal deficit present.     Mental Status: He is alert and oriented to person, place, and time.  Psychiatric:        Mood and Affect: Mood normal.        Behavior: Behavior normal.    Assessment & Plan:  1: Chronic heart failure with reduced ejection fraction- - NYHA class I - euvolemic today - weighing daily; reminded to call for an overnight weight gain of > 2 pounds or a weekly weight gain of > 5 pounds - weight up 4 lbs from last visit, he states he has been intentionally trying to gain "healthy weight" and was previously as low at 110 due to GI issues - on GDMT of entresto; adamant about not starting BB, MRA or SGLT-2 - not adding salt  - BNP 10/31/20 was 459.0  2: HTN- - BP elevated today 189/106, rechecked 173/94; was at PCP today was 138/86; and patient is not wanting or interested in starting anything; "it's always high at medical appointments" - saw PCP Silvio Pate) today  - lab work obtained at PCP  3:  DM- - A1c 09/08/20 was 7.3 - A1c obtained today at PCP - glucose runs between 120-160  4: Tobacco use- - current tobacco use of 1/2 ppd   Patient did not bring his medications nor a list. Each medication was verbally reviewed with the patient and he was encouraged to bring the bottles  to every visit to confirm accuracy of list.  Return in 3 months or sooner for any questions/problems before then.

## 2021-06-28 NOTE — Patient Instructions (Signed)
Return to HF clinic in 6 months.  Call if you have any weight gain or worsening HF symptoms.

## 2021-06-28 NOTE — Assessment & Plan Note (Addendum)
Hasn't tolerated meds Seems to be okay Will check labs Fairly high dose gabapentin for the neuropathy

## 2021-06-28 NOTE — Assessment & Plan Note (Signed)
Ran out of budesonide which helped---will refill

## 2021-06-28 NOTE — Assessment & Plan Note (Signed)
Seems to be compensated on entresto and prn furosemide

## 2021-06-28 NOTE — Assessment & Plan Note (Signed)
On imaging Off statin--?myopathy

## 2021-06-28 NOTE — Progress Notes (Signed)
Subjective:    Patient ID: Kyle Ramsey, male    DOB: 11/15/1955, 65 y.o.   MRN: [id number]  HPI Here for follow up of diabetes and other chronic health conditions This visit occurred during the SARS-CoV-2 public health emergency.  Safety protocols were in place, including screening questions prior to the visit, additional usage of staff PPE, and extensive cleaning of exam room while observing appropriate contact time as indicated for disinfecting solutions.   Known diabetes ---hasn't tolerated metformin and several others Bad neuropathy from this---varies the dosage but typically 600mg  three times a day (occasionally 4) Checks sugars once a week--- usually 120-160. 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     DULoxetine (CYMBALTA) 30 MG capsule Take 1 capsule (30 mg total) by mouth daily. 30 capsule 3   furosemide (LASIX) 40 MG tablet Take 1 tablet (40 mg total) by mouth 2 (two) times daily. 60 tablet 3   gabapentin (NEURONTIN) 300 MG capsule TAKE 2 CAPSULES (600 MG) AT 7AM, 2 CAPS AT 11AM, 2 CAPS AT 3PM & 3 CAPSULES (900 MG) AT BEDTIME 270 capsule 5   Multiple Vitamin (MULTIVITAMIN WITH MINERALS) TABS tablet Take 1 tablet by mouth daily.     Omega-3 Fatty Acids (OMEGA 3 500 PO) Take by mouth. Omega XL - daily for arthritis     OVER THE COUNTER MEDICATION Super Beets powder     sacubitril-valsartan (ENTRESTO) 49-51 MG Take 1 tablet by mouth 2 (two) times daily. 180 tablet 3   No current facility-administered medications on file prior to visit.    Allergies  Allergen  Reactions   Losartan Shortness Of Breath   Azithromycin Other (See Comments)   Codeine Nausea And Vomiting   Erythromycin Other (See Comments)    Gall bladder problem   Glipizide Other (See Comments)   Metformin Hcl Er Other (See Comments)   Onion Other (See Comments)    Past Medical History:  Diagnosis Date   CHF (congestive heart failure) ([place])    Diabetes mellitus ([place])    Hypertension    Kidney calculi     Past Surgical History:  Procedure Laterality Date   HERNIA REPAIR Bilateral 09/2020    Family History  Problem Relation Age of Onset   Diabetes Mother    Dementia Mother    Breast cancer Mother    Diabetes Father    Dementia Father     Social History   Socioeconomic History   Marital status: Single    Spouse name: Not on file   Number of children: 2   Years of education: high school   Highest education level: Not on file  Occupational History   Not on file  Tobacco Use   Smoking status: Every Day    Packs/day: 0.50    Years: 40.00    Pack years: 20.00    Types: Cigarettes   Smokeless tobacco: Never   Tobacco comments:    1 ppd 40 years, cut back to 0.5 ppd  Vaping Use   Vaping Use: Never used  Substance and Sexual Activity   Alcohol use: Not Currently  Comment: nothing    Drug use: Never   Sexual activity: Not Currently  Other Topics Concern   Not on file  Social History Narrative   09/08/20   From: the area   Living: alone   Work: cannot work due to his neurological pain, SS income only      Family: 2 children - Gwyndolyn Saxon and [place] - 4 grandchildren      Enjoys: skeet shoot      Exercise: walking   Diet: diabetic diet      Safety   Seat belts: Yes    Guns: no   Safe in relationships: Yes    Social Determinants of Radio broadcast assistant Strain: Low Risk    Difficulty of Paying Living Expenses: Not very hard  Food Insecurity: Not on file  Transportation Needs: Not on file  Physical Activity: Not on file  Stress: Not on  file  Social Connections: Not on file  Intimate Partner Violence: Not on file   Review of Systems Uses ibuprofen rarely--told him to avoid and use tylenol instead Some diarrhea at times---uses the diphenoxylate rarely (for microscopic colitis). Ran out of entocort--but it helped     Objective:   Physical Exam Constitutional:      Appearance: Normal appearance.  Cardiovascular:     Rate and Rhythm: Normal rate and regular rhythm.     Pulses: Normal pulses.     Heart sounds: No murmur heard.   No gallop.  Pulmonary:     Effort: Pulmonary effort is normal.     Breath sounds: Normal breath sounds. No wheezing or rales.  Abdominal:     Palpations: Abdomen is soft.     Tenderness: There is no abdominal tenderness.  Musculoskeletal:     Cervical back: Neck supple.     Right lower leg: No edema.     Left lower leg: No edema.  Lymphadenopathy:     Cervical: No cervical adenopathy.  Skin:    Findings: No lesion.     Comments: No foot lesions  Neurological:     Mental Status: He is alert.     Comments: Decreased sensation in feet           Assessment & Plan:

## 2021-06-29 DIAGNOSIS — D492 Neoplasm of unspecified behavior of bone, soft tissue, and skin: Secondary | ICD-10-CM | POA: Diagnosis not present

## 2021-06-29 DIAGNOSIS — L821 Other seborrheic keratosis: Secondary | ICD-10-CM | POA: Diagnosis not present

## 2021-06-29 DIAGNOSIS — L304 Erythema intertrigo: Secondary | ICD-10-CM | POA: Diagnosis not present

## 2021-06-29 DIAGNOSIS — C44229 Squamous cell carcinoma of skin of left ear and external auricular canal: Secondary | ICD-10-CM | POA: Diagnosis not present

## 2021-07-07 ENCOUNTER — Other Ambulatory Visit: Payer: Self-pay | Admitting: Neurology

## 2021-07-28 ENCOUNTER — Encounter: Payer: Self-pay | Admitting: Neurology

## 2021-07-28 ENCOUNTER — Other Ambulatory Visit: Payer: Self-pay

## 2021-07-28 ENCOUNTER — Ambulatory Visit: Payer: Medicare Other | Admitting: Neurology

## 2021-07-28 VITALS — BP 178/88 | HR 79 | Ht 67.0 in | Wt 154.0 lb

## 2021-07-28 DIAGNOSIS — R202 Paresthesia of skin: Secondary | ICD-10-CM

## 2021-07-28 DIAGNOSIS — G959 Disease of spinal cord, unspecified: Secondary | ICD-10-CM

## 2021-07-28 MED ORDER — DULOXETINE HCL 30 MG PO CPEP: 30.0000 mg | ORAL_CAPSULE | Freq: Two times a day (BID) | ORAL | 11 refills | Status: DC

## 2021-07-28 MED ORDER — GABAPENTIN 300 MG PO CAPS: ORAL_CAPSULE | ORAL | 11 refills | Status: DC
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
## 2021-07-28 NOTE — Patient Instructions (Signed)
Increase Cymbalta 30mg  twice daily  Continue gabapentin  Return to clinic in 9 months

## 2021-08-07 ENCOUNTER — Other Ambulatory Visit: Payer: Self-pay | Admitting: Neurology

## 2021-08-16 ENCOUNTER — Telehealth: Payer: Self-pay

## 2021-08-16 NOTE — Chronic Care Management (AMB) (Addendum)
° ° °  Chronic Care Management Pharmacy Assistant   Name: Kyle Ramsey  MRN: [id number] DOB: Jan 09, 1956  Reason for Encounter: Reminder Call   Conditions to be addressed/monitored: HTN and DMII   Medications: Outpatient Encounter Medications as of 08/16/2021  Medication Sig   budesonide (ENTOCORT EC) 3 MG 24 hr capsule Take 3 capsules (9 mg total) by mouth daily.   diphenoxylate-atropine (LOMOTIL) 2.5-0.025 MG tablet Take 1 tablet by mouth daily as needed for diarrhea or loose stools.   DULoxetine (CYMBALTA) 30 MG capsule Take 1 capsule (30 mg total) by mouth 2 (two) times daily.   gabapentin (NEURONTIN) 300 MG capsule TAKE 2 CAPSULES (600 MG) AT 7AM, 2 CAPS AT 11AM, 2 CAPS AT 3PM & 3 CAPSULES (900 MG) AT BEDTIME   Multiple Vitamin (MULTIVITAMIN WITH MINERALS) TABS tablet Take 1 tablet by mouth daily.   Omega-3 Fatty Acids (OMEGA 3 500 PO) Take by mouth. Omega XL - daily for arthritis   OVER THE COUNTER MEDICATION Super Beets powder   sacubitril-valsartan (ENTRESTO) 49-51 MG Take 1 tablet by mouth 2 (two) times daily.   No facility-administered encounter medications on file as of 08/16/2021.    [name]  did not answer the telephone  to remind him of his upcoming telephone visit with Debbora Dus on 08/21/21 at 3:00pm. Patient was reminded to have all medications, supplements and any blood glucose and blood pressure readings available for review at appointment. Left voicemail.    Star Rating Drugs: Medication:  Last Fill: Day Supply Entresto 49-51mg  12/09/20 Bishop, CPP notified  Avel Sensor, [place] Assistant [phone]  I have reviewed the care management and care coordination activities outlined in this encounter and I am certifying that I agree with the content of this note. No further action required.  Debbora Dus, PharmD Clinical Pharmacist [facility] at [facility] [phone]

## 2021-08-21 ENCOUNTER — Telehealth: Payer: Self-pay | Admitting: Family

## 2021-08-21 ENCOUNTER — Ambulatory Visit (INDEPENDENT_AMBULATORY_CARE_PROVIDER_SITE_OTHER): Payer: Medicare Other

## 2021-08-21 ENCOUNTER — Other Ambulatory Visit: Payer: Self-pay

## 2021-08-21 DIAGNOSIS — E1142 Type 2 diabetes mellitus with diabetic polyneuropathy: Secondary | ICD-10-CM

## 2021-08-21 DIAGNOSIS — I10 Essential (primary) hypertension: Secondary | ICD-10-CM

## 2021-08-21 DIAGNOSIS — I5022 Chronic systolic (congestive) heart failure: Secondary | ICD-10-CM

## 2021-08-21 NOTE — Patient Instructions (Signed)
Dear Kyle Ramsey,  Below is a summary of the goals we discussed during our follow up appointment on August 21, 2021. Please contact me anytime with questions or concerns.   Visit Information  Patient Care Plan: CCM Pharmacy Care Plan     Problem Identified: CHL AMB "PATIENT-SPECIFIC PROBLEM"      Long-Range Goal: Disease Management   Start Date: 10/12/2020  Recent Progress: On track  Priority: High  Note:   Current Barriers:  Elevated blood pressure  Pharmacist Clinical Goal(s):  Patient will contact provider office for questions/concerns as evidenced notation of same in electronic health record through collaboration with PharmD and provider.   Interventions: 1:1 collaboration with Lesleigh Noe, MD regarding development and update of comprehensive plan of care as evidenced by provider attestation and co-signature Inter-disciplinary care team collaboration (see longitudinal plan of care) Comprehensive medication review performed; medication list updated in electronic medical record  Hypertension (BP goal <140/90) -Uncontrolled - per clinic BP readings -Pt had poor experience with medications and does not want to start new meds. He is on Entresto for CHF. His dose was increased 05/2021 but pt is still finishing out some samples of previous dose. He will increase dose once completed current supply. -Current treatment: No pharmacotherapy -Medications previously tried: none reported  -Current home readings: he has not checked at home recently  -Denies hypotensive/hypertensive symptoms -Educated on BP goals and benefits of medications for prevention of heart attack, stroke and kidney damage;  -Recommend checking blood pressure at home periodically. Let us know how you tolerate Entresto 49-51 mg BID once started.  Heart Failure (Goal: manage symptoms and prevent exacerbations) -Controlled - managed by CHF clinic -Pt did not increase dose of Entresto to 49-51 mg yet. He is still  finishing out supply of 24-26 mg dose. -Last ejection fraction: 35-40% (Date: 11/2020) -HF type: Systolic -NYHA Class: I (no actitivty limitation) -AHA HF Stage: C (Heart disease and symptoms present) -Current treatment: Entresto 24-26 mg - 1 tablet twice daily  -Medications previously tried: none  - His weight is very stable, around 145 lbs. He weighs daily and limits salt. Reminded to call for an overnight weight gain of > 2 pounds or a weekly weight gain of > 5 , pounds. Continues to use tobacco - 1/2 PPD.  -Educated on Importance of blood pressure control  -Recommended to continue current medication; Once completed current supply Entresto, patient will take new dose 49-51 mg twice daily.  Diabetes (A1c goal <7%) -Controlled - A1c 6.2% (06/2021), congratulated on recent A1c -Current medications: None -Medications previously tried: [place], metformin   -Current home glucose readings - none reported today -Denies hypoglycemic/hyperglycemic symptoms -Current meal patterns: He is watching sweets and limiting carbohydrates.  -Educated on A1c and blood sugar goals; -Counseled to check feet daily and get yearly eye exams - Cataract Surgery right eye in 2022 -Statin: Pt declined -ACE-I: Yes -Neuropathy managed by nuerology - pt reports  He is feeling better on duloxetine 30 mg BID. -Recommend continue lifestyle management.  Patient Goals/Self-Care Activities Patient will:  - take medications as prescribed check blood pressure at home 2-3 days per week, document, and provide at future appointments  Follow Up Plan: Telephone follow up appointment with care management team member scheduled for:   - 6 months (CCM visit) - 3 months (CCM team BP review)      Patient verbalizes understanding of instructions provided today and agrees to view in [place].   Debbora Dus, PharmD Clinical Pharmacist Practitioner  [facility] at [place]

## 2021-08-21 NOTE — Progress Notes (Signed)
Chronic Care Management Pharmacy Note  08/21/2021 Name:  Kyle Ramsey MRN:  [id number] DOB:  Sep 10, 1955  Subjective: Kyle Ramsey is an 66 y.o. year old male who is a primary patient of Letvak, Theophilus Kinds, MD.  The CCM team was consulted for assistance with disease management and care coordination needs.    Engaged with patient by telephone for follow up visit in response to provider referral for pharmacy case management and/or care coordination services.   Consent to Services:  The patient was given information about Chronic Care Management services, agreed to services, and gave verbal consent prior to initiation of services.  Please see initial visit note for detailed documentation.   Patient Care Team: Venia Carbon, MD as PCP - General (Internal Medicine) Debbora Dus, [facility] as Pharmacist (Pharmacist) Alda Berthold, DO as Consulting Physician (Neurology)  Recent office visits: 06/28/21 - Viviana Simpler, MD - Pt presented for follow up diabetes. A1c improved, 6.2%. LDL 101, not on statin.   Recent consult visits: 07/28/2021 - Neurology - Pt presented for follow up visit for generalized paraesthesias. Improved on gabapentin, Cymbalta. Increase Cymbalta to 30 mg BID. Continue gabapentin 600 mg 3-4 times daily. RTC 9 months. 06/28/21 - Cardiology - Pt presented for CHF follow up, NYHA Class I. Euvolemic, weights daily. Blood pressure elevated, pt declined medications. Reports BP is always high at appointments. Tobacco use 1/2 PPD. Continue Entresto.   Hospital visits: None in past 6 months  Objective:  Lab Results  Component Value Date   CREATININE 1.04 06/28/2021   BUN 15 06/28/2021   GFR 74.84 06/28/2021   GFRNONAA >60 12/23/2020   NA 141 06/28/2021   K 3.5 06/28/2021   CALCIUM 8.8 06/28/2021   CO2 29 06/28/2021   GLUCOSE 86 06/28/2021   Lab Results  Component Value Date/Time   HGBA1C 6.2 06/28/2021 12:14 PM   HGBA1C 7.3 (A) 09/08/2020 11:58 AM   GFR 74.84  06/28/2021 12:14 PM   GFR 89.52 09/08/2020 12:22 PM    Lab Results  Component Value Date   CHOL 178 06/28/2021   HDL 69.80 06/28/2021   LDLCALC 101 (H) 06/28/2021   TRIG 39.0 06/28/2021   CHOLHDL 3 06/28/2021   Hepatic Function Latest Ref Rng & Units 06/28/2021 06/28/2021 10/25/2020  Total Protein 6.0 - 8.3 g/dL - 6.2 5.9(L)  Albumin 3.5 - 5.2 g/dL 4.0 4.0 3.3(L)  AST 0 - 37 U/L - 14 14(L)  ALT 0 - 53 U/L - 12 14  Alk Phosphatase 39 - 117 U/L - 58 60  Total Bilirubin 0.2 - 1.2 mg/dL - 1.2 0.9  Bilirubin, Direct 0.0 - 0.3 mg/dL - 0.2 0.1   Lab Results  Component Value Date/Time   FREET4 0.74 06/28/2021 12:14 PM    CBC Latest Ref Rng & Units 06/28/2021 10/25/2020 09/08/2020  WBC 4.0 - 10.5 K/uL 6.8 5.8 8.3  Hemoglobin 13.0 - 17.0 g/dL 13.7 10.8(L) 13.5  Hematocrit 39.0 - 52.0 % 40.6 31.3(L) 38.2(L)  Platelets 150.0 - 400.0 K/uL 183.0 219 200.0   No results found for: VD25OH  Clinical ASCVD: No  The 10-year ASCVD risk score (Arnett DK, et al., 2019) is: 41.2%   Values used to calculate the score:     Age: 55 years     Sex: Male     Is Non-Hispanic African American: No     Diabetic: Yes     Tobacco smoker: Yes     Systolic Blood Pressure: 138 mmHg  Is BP treated: No     HDL Cholesterol: 69.8 mg/dL     Total Cholesterol: 178 mg/dL    Depression screen [facility] 2/9 06/28/2021 03/22/2021 09/08/2020  Decreased Interest 0 0 0  Down, Depressed, Hopeless 0 0 0  PHQ - 2 Score 0 0 0    Social History   Tobacco Use  Smoking Status Every Day   Packs/day: 0.50   Years: 40.00   Pack years: 20.00   Types: Cigarettes  Smokeless Tobacco Never  Tobacco Comments   1 ppd 40 years, cut back to 0.5 ppd   BP Readings from Last 3 Encounters:  07/28/21 (!) 178/88  06/28/21 (!) 173/94  06/28/21 138/86   Pulse Readings from Last 3 Encounters:  07/28/21 79  06/28/21 74  06/28/21 77   Wt Readings from Last 3 Encounters:  07/28/21 154 lb (69.9 kg)  06/28/21 150 lb 8 oz (68.3 kg)   06/28/21 151 lb (68.5 kg)   BMI Readings from Last 3 Encounters:  07/28/21 24.12 kg/m  06/28/21 23.57 kg/m  06/28/21 23.65 kg/m    Assessment/Interventions: Review of patient past medical history, allergies, medications, health status, including review of consultants reports, laboratory and other test data, was performed as part of comprehensive evaluation and provision of chronic care management services.   SDOH:  (Social Determinants of Health) assessments and interventions performed: Yes SDOH Interventions    Flowsheet Row Most Recent Value  SDOH Interventions   Financial Strain Interventions Intervention Not Indicated       SDOH Screenings   Alcohol Screen: Not on file  Depression (PHQ2-9): Low Risk    PHQ-2 Score: 0  Financial Resource Strain: Low Risk    Difficulty of Paying Living Expenses: Not very hard  Food Insecurity: Not on file  Housing: Not on file  Physical Activity: Not on file  Social Connections: Not on file  Stress: Not on file  Tobacco Use: High Risk   Smoking Tobacco Use: Every Day   Smokeless Tobacco Use: Never   Passive Exposure: Not on file  Transportation Needs: Not on file    CCM Care Plan  Allergies  Allergen Reactions   Losartan Shortness Of Breath   Azithromycin Other (See Comments)   Codeine Nausea And Vomiting   Erythromycin Other (See Comments)    Gall bladder problem   Glipizide Other (See Comments)   Metformin Hcl Er Other (See Comments)   Onion Other (See Comments)    Medications Reviewed Today     Reviewed by Phil Dopp, [facility] (Pharmacist) on 08/21/21 at 1556  Med List Status: <None>   Medication Order Taking? Sig Documenting Provider Last Dose Status Informant  budesonide (ENTOCORT EC) 3 MG 24 hr capsule [id number] Yes Take 3 capsules (9 mg total) by mouth daily. Karie Schwalbe, MD Taking Active   diphenoxylate-atropine (LOMOTIL) 2.5-0.025 MG tablet [id number] Yes Take 1 tablet by mouth daily as needed for  diarrhea or loose stools. Karie Schwalbe, MD Taking Active   DULoxetine (CYMBALTA) 30 MG capsule [id number] Yes Take 1 capsule (30 mg total) by mouth 2 (two) times daily. Nita Sickle K, DO Taking Active   gabapentin (NEURONTIN) 300 MG capsule [id number] Yes TAKE 2 CAPSULES (600 MG) AT 7AM, 2 CAPS AT 11AM, 2 CAPS AT 3PM & 3 CAPSULES (900 MG) AT BEDTIME Nita Sickle K, DO Taking Active   Multiple Vitamin (MULTIVITAMIN WITH MINERALS) TABS tablet [id number] Yes Take 1 tablet by mouth daily. [provider] Taking Active  Omega-3 Fatty Acids (OMEGA 3 500 PO) [id number] Yes Take by mouth. Omega XL - daily for arthritis [provider] Taking Active Self  OVER THE COUNTER MEDICATION [id number] Yes Super Beets powder [provider] Taking Active   sacubitril-valsartan (ENTRESTO) 49-51 MG [id number] Yes Take 1 tablet by mouth 2 (two) times daily.  Patient taking differently: Take 1 tablet by mouth 2 (two) times daily. Pt still taking 24/26 mg dose - BID as of 08/21/2021   Alisa Graff, FNP Taking Active             Patient Active Problem List   Diagnosis Date Noted   Chronic systolic heart failure ([place]) 06/28/2021   Atrial bigeminy 09/08/2020   Lymphocytic colitis 09/08/2020   Essential hypertension 09/08/2020   Neuropathy 09/08/2020   Cataract 09/08/2020   Myopathy 09/08/2020   Tobacco abuse 09/08/2020   Aortic atherosclerosis ([place]) 09/08/2020   Bilateral inguinal hernia, without obstruction or gangrene, not specified as recurrent 06/27/2020   Type 2 diabetes mellitus with diabetic neuropathy affecting both sides of body ([place]) 02/25/2020    Immunization History  Administered Date(s) Administered   Fluad Quad(high Dose 65+) 06/28/2021   PFIZER(Purple Top)SARS-COV-2 Vaccination 11/19/2019, 12/26/2019   PNEUMOCOCCAL CONJUGATE-20 06/28/2021    Conditions to be addressed/monitored:  Hypertension, Diabetes, and Heart Failure  Care Plan : [place]   Updates made by Debbora Dus, [place] since 08/21/2021 12:00 AM     Problem: CHL AMB "PATIENT-SPECIFIC PROBLEM"      Long-Range Goal: Disease Management   Start Date: 10/12/2020  Recent Progress: On track  Priority: High  Note:   Current Barriers:  Elevated blood pressure  Pharmacist Clinical Goal(s):  Patient will contact provider office for questions/concerns as evidenced notation of same in electronic health record through collaboration with PharmD and provider.   Interventions: 1:1 collaboration with Lesleigh Noe, MD regarding development and update of comprehensive plan of care as evidenced by provider attestation and co-signature Inter-disciplinary care team collaboration (see longitudinal plan of care) Comprehensive medication review performed; medication list updated in electronic medical record  Hypertension (BP goal <140/90) -Uncontrolled - per clinic BP readings -Pt had poor experience with medications and does not want to start new meds. He is on Entresto for CHF. His dose was increased 05/2021 but pt is still finishing out some samples of previous dose. He will increase dose once completed current supply. -Current treatment: No pharmacotherapy -Medications previously tried: none reported  -Current home readings: he has not checked at home recently  -Denies hypotensive/hypertensive symptoms -Educated on BP goals and benefits of medications for prevention of heart attack, stroke and kidney damage;  -Recommend checking blood pressure at home periodically. Let us know how you tolerate Entresto 49-51 mg BID once started.  Heart Failure (Goal: manage symptoms and prevent exacerbations) -Controlled - managed by CHF clinic -Pt did not increase dose of Entresto to 49-51 mg yet. He is still finishing out supply of 24-26 mg dose. -Last ejection fraction: 35-40% (Date: 11/2020) -HF type: Systolic -NYHA Class: I (no actitivty limitation) -AHA HF Stage: C (Heart disease and  symptoms present) -Current treatment: Entresto 24-26 mg - 1 tablet twice daily  -Medications previously tried: none  - His weight is very stable, around 145 lbs. He weighs daily and limits salt. Reminded to call for an overnight weight gain of > 2 pounds or a weekly weight gain of > 5 , pounds. Continues to use tobacco - 1/2 PPD.  -Educated on  Importance of blood pressure control  -Recommended to continue current medication; Once completed current supply Entresto, patient will take new dose 49-51 mg twice daily.  Diabetes (A1c goal <7%) -Controlled - A1c 6.2% (06/2021), congratulated on recent A1c -Current medications: None -Medications previously tried: [place], metformin   -Current home glucose readings - none reported today -Denies hypoglycemic/hyperglycemic symptoms -Current meal patterns: He is watching sweets and limiting carbohydrates.  -Educated on A1c and blood sugar goals; -Counseled to check feet daily and get yearly eye exams - Cataract Surgery right eye in 2022 -Statin: Pt declined -ACE-I: Yes -Neuropathy managed by nuerology - pt reports  He is feeling better on duloxetine 30 mg BID. -Recommend continue lifestyle management.  Patient Goals/Self-Care Activities Patient will:  - take medications as prescribed check blood pressure at home 2-3 days per week, document, and provide at future appointments  Follow Up Plan: Telephone follow up appointment with care management team member scheduled for:   - 6 months (CCM visit) - 3 months (CCM team BP review)     Medication Assistance: None required.  Patient affirms current coverage meets needs.   Compliance/Adherence/Medication fill history: Care Gaps: Due for diabetic eye exam - Pt reports this was completed in 2022. Vaccines - Shingrix, TDAP, COVID-19 Booster, Pneumococcal Hepatitis C screening  Star-Rating Drugs: None identified - Pt receives Entresto through PAP  Patient's preferred pharmacy is:  [id number] [place], [address] [place] [address] Phone: [phone] Fax: [phone]  Uses pill box? No - denies need   Care Plan and Follow Up Patient Decision:  Patient agrees to Care Plan and Follow-up.  Debbora Dus, PharmD Clinical Pharmacist [facility] at [facility] [phone]

## 2021-08-21 NOTE — Telephone Encounter (Signed)
Notified patient that he was approved for [employer] Patient assistance for Entresto till 08/12/22.   Zyliah Schier, NT

## 2021-09-12 DIAGNOSIS — I10 Essential (primary) hypertension: Secondary | ICD-10-CM | POA: Diagnosis not present

## 2021-09-12 DIAGNOSIS — E1142 Type 2 diabetes mellitus with diabetic polyneuropathy: Secondary | ICD-10-CM | POA: Diagnosis not present

## 2021-09-12 DIAGNOSIS — I5022 Chronic systolic (congestive) heart failure: Secondary | ICD-10-CM | POA: Diagnosis not present

## 2021-09-13 ENCOUNTER — Other Ambulatory Visit: Payer: Self-pay | Admitting: Family

## 2021-09-13 MED ORDER — SACUBITRIL-VALSARTAN 49-51 MG PO TABS: 0.5000 | ORAL_TABLET | Freq: Two times a day (BID) | ORAL | 3 refills | Status: DC

## 2021-09-25 NOTE — Progress Notes (Unsigned)
Patient ID: Kyle Ramsey, male    DOB: 20-May-1956, 66 y.o.   MRN: [id number]  Kyle Ramsey is a 65 y/o male with a history of DM, CKD, HTN, current tobacco use and chronic heart failure.   Echo report from 11/25/20 reviewed and showed an EF of 35-40% along with mild LAE and mild Kyle.   Has not been admitted or been in the ED in the last 6 months.    He presents today with a chief complaint of a follow-up visit.   Past Medical History:  Diagnosis Date   CHF (congestive heart failure) ([place])    Diabetes mellitus ([place])    Hypertension    Kidney calculi    Past Surgical History:  Procedure Laterality Date   HERNIA REPAIR Bilateral 09/2020   Family History  Problem Relation Age of Onset   Diabetes Mother    Dementia Mother    Breast cancer Mother    Diabetes Father    Dementia Father    Social History   Tobacco Use   Smoking status: Every Day    Packs/day: 0.50    Years: 40.00    Pack years: 20.00    Types: Cigarettes   Smokeless tobacco: Never   Tobacco comments:    1 ppd 40 years, cut back to 0.5 ppd  Substance Use Topics   Alcohol use: Not Currently    Comment: nothing    Allergies  Allergen Reactions   Losartan Shortness Of Breath   Azithromycin Other (See Comments)   Codeine Nausea And Vomiting   Erythromycin Other (See Comments)    Gall bladder problem   Glipizide Other (See Comments)   Metformin Hcl Er Other (See Comments)   Onion Other (See Comments)    Review of Systems  Constitutional:  Negative for appetite change and fatigue.  HENT:  Negative for congestion, postnasal drip and sore throat.   Eyes: Negative.   Respiratory:  Negative for cough and shortness of breath.   Cardiovascular:  Negative for chest pain, palpitations and leg swelling.  Gastrointestinal:  Negative for abdominal distention and abdominal pain.  Endocrine: Negative.   Genitourinary: Negative.   Musculoskeletal:  Negative for arthralgias (leg pain improving) and back pain.   Skin: Negative.   Allergic/Immunologic: Negative.   Neurological:  Negative for dizziness and light-headedness.  Hematological:  Negative for adenopathy. Does not bruise/bleed easily.  Psychiatric/Behavioral:  Negative for dysphoric mood and sleep disturbance. The patient is nervous/anxious and is hyperactive.      Physical Exam Vitals and nursing note reviewed.  Constitutional:      General: He is not in acute distress.    Appearance: Normal appearance.  HENT:     Head: Normocephalic and atraumatic.  Neck:     Vascular: No carotid bruit.  Cardiovascular:     Rate and Rhythm: Normal rate and regular rhythm.     Pulses: Normal pulses.     Heart sounds: No murmur heard. Pulmonary:     Effort: Pulmonary effort is normal. No respiratory distress.     Breath sounds: No wheezing or rales.  Abdominal:     Palpations: Abdomen is soft.     Tenderness: There is no abdominal tenderness.  Musculoskeletal:        General: No tenderness.     Cervical back: Normal range of motion and neck supple.     Right lower leg: No edema.     Left lower leg: No edema.  Skin:    General:  Skin is warm and dry.  Neurological:     General: No focal deficit present.     Mental Status: He is alert and oriented to person, place, and time.  Psychiatric:        Mood and Affect: Mood normal.        Behavior: Behavior normal.    Assessment & Plan:  1: Chronic heart failure with reduced ejection fraction- - NYHA class I - euvolemic today - weighing daily; reminded to call for an overnight weight gain of > 2 pounds or a weekly weight gain of > 5 pounds - weight 150.8 lbs from last visit here 3 months ago - on GDMT of entresto; patient is adamant about not starting BB, MRA or SGLT-2 - not adding salt  - BNP 10/31/20 was 459.0  2: HTN- - BP  - saw PCP Silvio Pate) 06/28/21 - BMP 06/28/21 reviewed and showed sodium 141, potassium 3.5, creatinine 1.04 and GFR 74.84  3: DM- - A1c 06/28/21 was 6.2% -  glucose runs between   4: Tobacco use- - current tobacco use of 1/2 ppd   Patient did not bring his medications nor a list. Each medication was verbally reviewed with the patient and he was encouraged to bring the bottles to every visit to confirm accuracy of list.

## 2021-09-27 ENCOUNTER — Ambulatory Visit: Payer: Medicare Other | Admitting: Family

## 2021-10-05 ENCOUNTER — Ambulatory Visit: Payer: Medicare Other | Admitting: Family

## 2021-10-09 ENCOUNTER — Ambulatory Visit: Payer: Medicare Other | Admitting: Family

## 2021-10-16 ENCOUNTER — Ambulatory Visit: Payer: Medicare Other | Admitting: Family

## 2021-10-26 ENCOUNTER — Other Ambulatory Visit: Payer: Self-pay

## 2021-10-26 ENCOUNTER — Encounter: Payer: Self-pay | Admitting: Family

## 2021-10-26 ENCOUNTER — Ambulatory Visit: Payer: Medicare Other | Attending: Family | Admitting: Family

## 2021-10-26 VITALS — BP 164/96 | HR 88 | Resp 14 | Ht 67.0 in | Wt 162.0 lb

## 2021-10-26 DIAGNOSIS — Z79899 Other long term (current) drug therapy: Secondary | ICD-10-CM | POA: Diagnosis not present

## 2021-10-26 DIAGNOSIS — I130 Hypertensive heart and chronic kidney disease with heart failure and stage 1 through stage 4 chronic kidney disease, or unspecified chronic kidney disease: Secondary | ICD-10-CM | POA: Insufficient documentation

## 2021-10-26 DIAGNOSIS — I5022 Chronic systolic (congestive) heart failure: Secondary | ICD-10-CM | POA: Insufficient documentation

## 2021-10-26 DIAGNOSIS — E1122 Type 2 diabetes mellitus with diabetic chronic kidney disease: Secondary | ICD-10-CM | POA: Insufficient documentation

## 2021-10-26 DIAGNOSIS — Z720 Tobacco use: Secondary | ICD-10-CM | POA: Diagnosis not present

## 2021-10-26 DIAGNOSIS — G959 Disease of spinal cord, unspecified: Secondary | ICD-10-CM | POA: Diagnosis not present

## 2021-10-26 DIAGNOSIS — N189 Chronic kidney disease, unspecified: Secondary | ICD-10-CM | POA: Insufficient documentation

## 2021-10-26 DIAGNOSIS — I10 Essential (primary) hypertension: Secondary | ICD-10-CM | POA: Diagnosis not present

## 2021-10-26 DIAGNOSIS — F17210 Nicotine dependence, cigarettes, uncomplicated: Secondary | ICD-10-CM | POA: Diagnosis not present

## 2021-10-26 DIAGNOSIS — E1169 Type 2 diabetes mellitus with other specified complication: Secondary | ICD-10-CM | POA: Diagnosis not present

## 2021-10-26 MED ORDER — SACUBITRIL-VALSARTAN 24-26 MG PO TABS: 1.0000 | ORAL_TABLET | Freq: Two times a day (BID) | ORAL | 0 refills | Status: DC

## 2021-10-26 NOTE — Patient Instructions (Signed)
Continue weighing daily and call for an overnight weight gain of 3 pounds or more or a weekly weight gain of more than 5 pounds.   If you have voicemail, please make sure your mailbox is cleaned out so that we may leave a message and please make sure to listen to any voicemails.     

## 2021-10-26 NOTE — Progress Notes (Signed)
? Patient ID: Kyle Ramsey, male    DOB: June 30, 1956, 66 y.o.   MRN: [id number] ? ?Kyle Ramsey is a 66 y/o male with a history of DM, CKD, HTN, current tobacco use and chronic heart failure.  ? ?Echo report from 11/25/20 reviewed and showed an EF of 35-40% along with mild LAE and mild Kyle.  ? ?Has not been admitted or been in the ED in the last 6 months.   ? ?He presents today for a follow-up visit with a chief complaint of dizziness. He says that this occurred when he was taking the 49/'51mg'$  dose. It was then cut in half but the dizziness continued so he stopped taking it completely. Has been off of it for ~ 3 weeks but would like to resume the 24/'26mg'$  dose as he tolerated that fine but for some reason can't tolerate cutting the other dose in half. Has noted a gradual weight gain but says that he was trying to put some weight back on and it's been rising slowly. Has no other symptoms and denies any difficulty sleeping, abdominal distention, palpitations, pedal edema, chest pain, shortness of breath, cough or fatigue.  ? ?Past Medical History:  ?Diagnosis Date  ? CHF (congestive heart failure) ([place])   ? Diabetes mellitus ([place])   ? Hypertension   ? Kidney calculi   ? ?Past Surgical History:  ?Procedure Laterality Date  ? HERNIA REPAIR Bilateral 09/2020  ? ?Family History  ?Problem Relation Age of Onset  ? Diabetes Mother   ? Dementia Mother   ? Breast cancer Mother   ? Diabetes Father   ? Dementia Father   ? ?Social History  ? ?Tobacco Use  ? Smoking status: Every Day  ?  Packs/day: 0.50  ?  Years: 40.00  ?  Pack years: 20.00  ?  Types: Cigarettes  ? Smokeless tobacco: Never  ? Tobacco comments:  ?  1 ppd 40 years, cut back to 0.5 ppd  ?Substance Use Topics  ? Alcohol use: Not Currently  ?  Comment: nothing   ? ?Allergies  ?Allergen Reactions  ? Losartan Shortness Of Breath  ? Azithromycin Other (See Comments)  ? Codeine Nausea And Vomiting  ? Erythromycin Other (See Comments)  ?  Gall bladder problem  ? Glipizide Other  (See Comments)  ? Metformin Hcl Er Other (See Comments)  ? Onion Other (See Comments)  ? ?Prior to Admission medications   ?Medication Sig Start Date End Date Taking? Authorizing Provider  ?budesonide (ENTOCORT EC) 3 MG 24 hr capsule Take 3 capsules (9 mg total) by mouth daily. 06/28/21  Yes Venia Carbon, MD  ?diphenoxylate-atropine (LOMOTIL) 2.5-0.025 MG tablet Take 1 tablet by mouth daily as needed for diarrhea or loose stools. 06/28/21  Yes Venia Carbon, MD  ?DULoxetine (CYMBALTA) 30 MG capsule Take 1 capsule (30 mg total) by mouth 2 (two) times daily. 07/28/21  Yes Patel, Donika K, DO  ?gabapentin (NEURONTIN) 300 MG capsule TAKE 2 CAPSULES (600 MG) AT 7AM, 2 CAPS AT 11AM, 2 CAPS AT 3PM & 3 CAPSULES (900 MG) AT BEDTIME 07/28/21  Yes Narda Amber K, DO  ?Multiple Vitamin (MULTIVITAMIN WITH MINERALS) TABS tablet Take 1 tablet by mouth daily.   Yes [provider]  ?Omega-3 Fatty Acids (OMEGA 3 500 PO) Take by mouth. Omega XL - daily for arthritis   Yes [provider]  ?OVER THE COUNTER MEDICATION Super Beets powder   Yes [provider]  ?sacubitril-valsartan (ENTRESTO) 49-51 MG Take  0.5 tablets by mouth 2 (two) times daily. ?Patient not taking: Reported on 10/26/2021 09/13/21   Alisa Graff, FNP  ? ? ?Review of Systems  ?Constitutional:  Negative for appetite change and fatigue.  ?HENT:  Negative for congestion, postnasal drip and sore throat.   ?Eyes: Negative.   ?Respiratory:  Negative for cough and shortness of breath.   ?Cardiovascular:  Negative for chest pain, palpitations and leg swelling.  ?Gastrointestinal:  Negative for abdominal distention and abdominal pain.  ?Endocrine: Negative.   ?Genitourinary: Negative.   ?Musculoskeletal:  Negative for arthralgias (leg pain improving) and back pain.  ?Skin: Negative.   ?Allergic/Immunologic: Negative.   ?Neurological:  Positive for dizziness (with entresto 49/'51mg'$  dose). Negative for light-headedness.  ?Hematological:   Negative for adenopathy. Does not bruise/bleed easily.  ?Psychiatric/Behavioral:  Negative for dysphoric mood and sleep disturbance. The patient is nervous/anxious and is hyperactive.   ? ?Vitals:  ? 10/26/21 0959  ?BP: (!) 164/96  ?Pulse: 88  ?Resp: 14  ?SpO2: 100%  ?Weight: 162 lb (73.5 kg)  ?Height: '5\' 7"'$  (1.702 m)  ? ?Wt Readings from Last 3 Encounters:  ?10/26/21 162 lb (73.5 kg)  ?07/28/21 154 lb (69.9 kg)  ?06/28/21 150 lb 8 oz (68.3 kg)  ? ?Lab Results  ?Component Value Date  ? CREATININE 1.04 06/28/2021  ? CREATININE 0.82 12/23/2020  ? CREATININE 1.08 11/03/2020  ? ?Physical Exam ?Vitals and nursing note reviewed.  ?Constitutional:   ?   General: He is not in acute distress. ?   Appearance: Normal appearance.  ?HENT:  ?   Head: Normocephalic and atraumatic.  ?Neck:  ?   Vascular: No carotid bruit.  ?Cardiovascular:  ?   Rate and Rhythm: Normal rate and regular rhythm.  ?   Pulses: Normal pulses.  ?   Heart sounds: No murmur heard. ?Pulmonary:  ?   Effort: Pulmonary effort is normal. No respiratory distress.  ?   Breath sounds: No wheezing or rales.  ?Abdominal:  ?   Palpations: Abdomen is soft.  ?   Tenderness: There is no abdominal tenderness.  ?Musculoskeletal:     ?   General: No tenderness.  ?   Cervical back: Normal range of motion and neck supple.  ?   Right lower leg: No edema.  ?   Left lower leg: No edema.  ?Skin: ?   General: Skin is warm and dry.  ?Neurological:  ?   General: No focal deficit present.  ?   Mental Status: He is alert and oriented to person, place, and time.  ?Psychiatric:     ?   Mood and Affect: Mood normal.     ?   Behavior: Behavior normal.  ? ? ?Assessment & Plan: ? ?1: Chronic heart failure with reduced ejection fraction- ?- NYHA class I ?- euvolemic today ?- weighing daily; reminded to call for an overnight weight gain of > 2 pounds or a weekly weight gain of > 5 pounds ?- weight up 12 lbs from last visit here 3 months ago; has been trying to gain weight and is eating  better; reports weight gain has been gradual ?- will resume entresto 24/'26mg'$  and 1 month samples provided; he gets this through [employer] patient assistance so new RX for 24/'26mg'$  BID faxed to them ?- patient is adamant about not starting BB, MRA or SGLT-2 ?- not adding salt  ?- BNP 10/31/20 was 459.0 ? ?2: HTN- ?- BP elevated but he's been off entresto for a few  weeks; resuming per above ?- saw PCP Silvio Pate) 06/28/21 ?- BMP 06/28/21 reviewed and showed sodium 141, potassium 3.5, creatinine 1.04 and GFR 74.84 ? ?3: DM- ?- A1c 06/28/21 was 6.2% ?- glucose was 120 at home today ? ?4: Tobacco use- ?- current tobacco use of 1/2 ppd ?- complete cessation encouraged ? ?5: Myelopathy- ?- saw neurology Posey Pronto) 07/28/21 ?- taking gabapentin and cymbalta ? ? ?Patient did not bring his medications nor a list. Each medication was verbally reviewed with the patient and he was encouraged to bring the bottles to every visit to confirm accuracy of list. ? ?Return in 3 months, sooner if needed.  ? ? ? ? ? ? ? ?

## 2021-12-28 ENCOUNTER — Encounter: Payer: Medicare Other | Admitting: Internal Medicine

## 2022-01-20 NOTE — Progress Notes (Deleted)
Patient ID: Kyle Ramsey, male    DOB: 10-20-1955, 66 y.o.   MRN: [id number]  Kyle Ramsey is a 66 y/o male with a history of DM, CKD, HTN, current tobacco use and chronic heart failure.   Echo report from 11/25/20 reviewed and showed an EF of 35-40% along with mild LAE and mild Kyle.   Has not been admitted or been in the ED in the last 6 months.    He presents today for a follow-up visit with a chief complaint of   Past Medical History:  Diagnosis Date   CHF (congestive heart failure) ([name])    Diabetes mellitus ([name])    Hypertension    Kidney calculi    Past Surgical History:  Procedure Laterality Date   HERNIA REPAIR Bilateral 09/2020   Family History  Problem Relation Age of Onset   Diabetes Mother    Dementia Mother    Breast cancer Mother    Diabetes Father    Dementia Father    Social History   Tobacco Use   Smoking status: Every Day    Packs/day: 0.50    Years: 40.00    Total pack years: 20.00    Types: Cigarettes   Smokeless tobacco: Never   Tobacco comments:    1 ppd 40 years, cut back to 0.5 ppd  Substance Use Topics   Alcohol use: Not Currently    Comment: nothing    Allergies  Allergen Reactions   Losartan Shortness Of Breath   Azithromycin Other (See Comments)   Codeine Nausea And Vomiting   Erythromycin Other (See Comments)    Gall bladder problem   Glipizide Other (See Comments)   Metformin Hcl Er Other (See Comments)   Onion Other (See Comments)     Review of Systems  Constitutional:  Negative for appetite change and fatigue.  HENT:  Negative for congestion, postnasal drip and sore throat.   Eyes: Negative.   Respiratory:  Negative for cough and shortness of breath.   Cardiovascular:  Negative for chest pain, palpitations and leg swelling.  Gastrointestinal:  Negative for abdominal distention and abdominal pain.  Endocrine: Negative.   Genitourinary: Negative.   Musculoskeletal:  Negative for arthralgias (leg pain improving) and back  pain.  Skin: Negative.   Allergic/Immunologic: Negative.   Neurological:  Positive for dizziness (with entresto 49/'51mg'$  dose). Negative for light-headedness.  Hematological:  Negative for adenopathy. Does not bruise/bleed easily.  Psychiatric/Behavioral:  Negative for dysphoric mood and sleep disturbance. The patient is nervous/anxious and is hyperactive.       Physical Exam Vitals and nursing note reviewed.  Constitutional:      General: He is not in acute distress.    Appearance: Normal appearance.  HENT:     Head: Normocephalic and atraumatic.  Neck:     Vascular: No carotid bruit.  Cardiovascular:     Rate and Rhythm: Normal rate and regular rhythm.     Pulses: Normal pulses.     Heart sounds: No murmur heard. Pulmonary:     Effort: Pulmonary effort is normal. No respiratory distress.     Breath sounds: No wheezing or rales.  Abdominal:     Palpations: Abdomen is soft.     Tenderness: There is no abdominal tenderness.  Musculoskeletal:        General: No tenderness.     Cervical back: Normal range of motion and neck supple.     Right lower leg: No edema.     Left lower  leg: No edema.  Skin:    General: Skin is warm and dry.  Neurological:     General: No focal deficit present.     Mental Status: He is alert and oriented to person, place, and time.  Psychiatric:        Mood and Affect: Mood normal.        Behavior: Behavior normal.     Assessment & Plan:  1: Chronic heart failure with reduced ejection fraction- - NYHA class I - euvolemic today - weighing daily; reminded to call for an overnight weight gain of > 2 pounds or a weekly weight gain of > 5 pounds - weight 162 lbs from last visit here 3 months ago - on GDMT of entresto - patient is adamant about not starting BB, MRA or SGLT-2 - not adding salt  - BNP 10/31/20 was 459.0  2: HTN- - BP  - saw PCP Kyle Ramsey) 06/28/21 - BMP 06/28/21 reviewed and showed sodium 141, potassium 3.5, creatinine 1.04 and GFR  74.84  3: DM- - A1c 06/28/21 was 6.2% - glucose was   4: Tobacco use- - current tobacco use of 1/2 ppd - complete cessation encouraged  5: Myelopathy- - saw neurology Kyle Ramsey) 07/28/21 - taking gabapentin and cymbalta   Patient did not bring his medications nor a list. Each medication was verbally reviewed with the patient and he was encouraged to bring the bottles to every visit to confirm accuracy of list.

## 2022-01-22 ENCOUNTER — Ambulatory Visit: Payer: Medicare Other | Admitting: Family

## 2022-01-23 ENCOUNTER — Ambulatory Visit: Payer: Medicare Other | Admitting: Internal Medicine

## 2022-02-12 DIAGNOSIS — L570 Actinic keratosis: Secondary | ICD-10-CM | POA: Diagnosis not present

## 2022-02-12 DIAGNOSIS — D225 Melanocytic nevi of trunk: Secondary | ICD-10-CM | POA: Diagnosis not present

## 2022-02-12 DIAGNOSIS — D492 Neoplasm of unspecified behavior of bone, soft tissue, and skin: Secondary | ICD-10-CM | POA: Diagnosis not present

## 2022-02-12 DIAGNOSIS — L821 Other seborrheic keratosis: Secondary | ICD-10-CM | POA: Diagnosis not present

## 2022-02-12 DIAGNOSIS — L814 Other melanin hyperpigmentation: Secondary | ICD-10-CM | POA: Diagnosis not present

## 2022-02-12 DIAGNOSIS — L820 Inflamed seborrheic keratosis: Secondary | ICD-10-CM | POA: Diagnosis not present

## 2022-02-14 ENCOUNTER — Ambulatory Visit: Payer: Medicare Other | Admitting: Family

## 2022-02-21 ENCOUNTER — Encounter: Payer: Medicare Other | Admitting: Internal Medicine

## 2022-02-23 NOTE — Telephone Encounter (Signed)
error 

## 2022-03-04 NOTE — Progress Notes (Deleted)
Patient ID: Kyle Ramsey, male    DOB: 10-20-1955, 66 y.o.   MRN: [id number]  [name] is a 66 y/o male with a history of DM, CKD, HTN, current tobacco use and chronic heart failure.   Echo report from 11/25/20 reviewed and showed an EF of 35-40% along with mild LAE and mild MR.   Has not been admitted or been in the ED in the last 6 months.    He presents today for a follow-up visit with a chief complaint of   Past Medical History:  Diagnosis Date   CHF (congestive heart failure) ([name])    Diabetes mellitus ([name])    Hypertension    Kidney calculi    Past Surgical History:  Procedure Laterality Date   HERNIA REPAIR Bilateral 09/2020   Family History  Problem Relation Age of Onset   Diabetes Mother    Dementia Mother    Breast cancer Mother    Diabetes Father    Dementia Father    Social History   Tobacco Use   Smoking status: Every Day    Packs/day: 0.50    Years: 40.00    Total pack years: 20.00    Types: Cigarettes   Smokeless tobacco: Never   Tobacco comments:    1 ppd 40 years, cut back to 0.5 ppd  Substance Use Topics   Alcohol use: Not Currently    Comment: nothing    Allergies  Allergen Reactions   Losartan Shortness Of Breath   Azithromycin Other (See Comments)   Codeine Nausea And Vomiting   Erythromycin Other (See Comments)    Gall bladder problem   Glipizide Other (See Comments)   Metformin Hcl Er Other (See Comments)   Onion Other (See Comments)     Review of Systems  Constitutional:  Negative for appetite change and fatigue.  HENT:  Negative for congestion, postnasal drip and sore throat.   Eyes: Negative.   Respiratory:  Negative for cough and shortness of breath.   Cardiovascular:  Negative for chest pain, palpitations and leg swelling.  Gastrointestinal:  Negative for abdominal distention and abdominal pain.  Endocrine: Negative.   Genitourinary: Negative.   Musculoskeletal:  Negative for arthralgias (leg pain improving) and back  pain.  Skin: Negative.   Allergic/Immunologic: Negative.   Neurological:  Positive for dizziness (with entresto 49/'51mg'$  dose). Negative for light-headedness.  Hematological:  Negative for adenopathy. Does not bruise/bleed easily.  Psychiatric/Behavioral:  Negative for dysphoric mood and sleep disturbance. The patient is nervous/anxious and is hyperactive.       Physical Exam Vitals and nursing note reviewed.  Constitutional:      General: He is not in acute distress.    Appearance: Normal appearance.  HENT:     Head: Normocephalic and atraumatic.  Neck:     Vascular: No carotid bruit.  Cardiovascular:     Rate and Rhythm: Normal rate and regular rhythm.     Pulses: Normal pulses.     Heart sounds: No murmur heard. Pulmonary:     Effort: Pulmonary effort is normal. No respiratory distress.     Breath sounds: No wheezing or rales.  Abdominal:     Palpations: Abdomen is soft.     Tenderness: There is no abdominal tenderness.  Musculoskeletal:        General: No tenderness.     Cervical back: Normal range of motion and neck supple.     Right lower leg: No edema.     Left lower  leg: No edema.  Skin:    General: Skin is warm and dry.  Neurological:     General: No focal deficit present.     Mental Status: He is alert and oriented to person, place, and time.  Psychiatric:        Mood and Affect: Mood normal.        Behavior: Behavior normal.     Assessment & Plan:  1: Chronic heart failure with reduced ejection fraction- - NYHA class I - euvolemic today - weighing daily; reminded to call for an overnight weight gain of > 2 pounds or a weekly weight gain of > 5 pounds - weight 162 lbs from last visit here 4 months ago - on GDMT of entresto - check BMP today - patient is adamant about not starting BB, MRA or SGLT-2 - not adding salt  - BNP 10/31/20 was 459.0  2: HTN- - BP  - saw PCP Silvio Pate) 06/28/21 - BMP 06/28/21 reviewed and showed sodium 141, potassium 3.5,  creatinine 1.04 and GFR 74.84  3: DM- - A1c 06/28/21 was 6.2% - glucose was   4: Tobacco use- - current tobacco use of 1/2 ppd - complete cessation encouraged  5: Myelopathy- - saw neurology Posey Pronto) 07/28/21 - taking gabapentin and cymbalta   Patient did not bring his medications nor a list. Each medication was verbally reviewed with the patient and he was encouraged to bring the bottles to every visit to confirm accuracy of list.

## 2022-03-05 ENCOUNTER — Ambulatory Visit: Payer: Medicare Other | Admitting: Family

## 2022-03-16 ENCOUNTER — Encounter: Payer: Self-pay | Admitting: Internal Medicine

## 2022-03-16 ENCOUNTER — Ambulatory Visit (INDEPENDENT_AMBULATORY_CARE_PROVIDER_SITE_OTHER): Payer: Medicare Other | Admitting: Internal Medicine

## 2022-03-16 VITALS — BP 140/86 | HR 91 | Temp 98.4°F | Ht 67.0 in | Wt 168.0 lb

## 2022-03-16 DIAGNOSIS — E1142 Type 2 diabetes mellitus with diabetic polyneuropathy: Secondary | ICD-10-CM

## 2022-03-16 DIAGNOSIS — K52832 Lymphocytic colitis: Secondary | ICD-10-CM | POA: Diagnosis not present

## 2022-03-16 DIAGNOSIS — I700 Atherosclerosis of aorta: Secondary | ICD-10-CM

## 2022-03-16 DIAGNOSIS — Z125 Encounter for screening for malignant neoplasm of prostate: Secondary | ICD-10-CM | POA: Diagnosis not present

## 2022-03-16 DIAGNOSIS — I5022 Chronic systolic (congestive) heart failure: Secondary | ICD-10-CM

## 2022-03-16 DIAGNOSIS — G629 Polyneuropathy, unspecified: Secondary | ICD-10-CM | POA: Diagnosis not present

## 2022-03-16 DIAGNOSIS — Z0000 Encounter for general adult medical examination without abnormal findings: Secondary | ICD-10-CM | POA: Insufficient documentation

## 2022-03-16 LAB — MICROALBUMIN / CREATININE URINE RATIO
Creatinine,U: 71.3 mg/dL
Microalb Creat Ratio: 42.4 mg/g — ABNORMAL HIGH (ref 0.0–30.0)
Microalb, Ur: 30.2 mg/dL — ABNORMAL HIGH (ref 0.0–1.9)

## 2022-03-16 LAB — LIPID PANEL
Cholesterol: 198 mg/dL (ref 0–200)
HDL: 97.1 mg/dL (ref >39.00)
LDL Cholesterol: 93 mg/dL (ref 0–99)
NonHDL: 101.13
Total CHOL/HDL Ratio: 2
Triglycerides: 42 mg/dL (ref 0.0–149.0)
VLDL: 8.4 mg/dL (ref 0.0–40.0)

## 2022-03-16 LAB — HEMOGLOBIN A1C: Hgb A1c MFr Bld: 7.5 % — ABNORMAL HIGH (ref 4.6–6.5)

## 2022-03-16 LAB — COMPREHENSIVE METABOLIC PANEL
ALT: 32 U/L (ref 0–53)
AST: 27 U/L (ref 0–37)
Albumin: 4.2 g/dL (ref 3.5–5.2)
Alkaline Phosphatase: 69 U/L (ref 39–117)
BUN: 15 mg/dL (ref 6–23)
CO2: 29 mEq/L (ref 19–32)
Calcium: 9.2 mg/dL (ref 8.4–10.5)
Chloride: 104 mEq/L (ref 96–112)
Creatinine, Ser: 1.04 mg/dL (ref 0.40–1.50)
GFR: 74.46 mL/min (ref >60.00)
Glucose, Bld: 169 mg/dL — ABNORMAL HIGH (ref 70–99)
Potassium: 4.1 mEq/L (ref 3.5–5.1)
Sodium: 140 mEq/L (ref 135–145)
Total Bilirubin: 1.3 mg/dL — ABNORMAL HIGH (ref 0.2–1.2)
Total Protein: 6.2 g/dL (ref 6.0–8.3)

## 2022-03-16 LAB — CBC
HCT: 43.9 % (ref 39.0–52.0)
Hemoglobin: 14.8 g/dL (ref 13.0–17.0)
MCHC: 33.8 g/dL (ref 30.0–36.0)
MCV: 103.6 fl — ABNORMAL HIGH (ref 78.0–100.0)
Platelets: 175 10*3/uL (ref 150.0–400.0)
RBC: 4.24 Mil/uL (ref 4.22–5.81)
RDW: 13.9 % (ref 11.5–15.5)
WBC: 6.7 10*3/uL (ref 4.0–10.5)

## 2022-03-16 LAB — HM DIABETES FOOT EXAM

## 2022-03-16 LAB — PSA, MEDICARE: PSA: 1.86 ng/ml (ref 0.10–4.00)

## 2022-03-16 MED ORDER — BUDESONIDE 3 MG PO CPEP: 9.0000 mg | ORAL_CAPSULE | Freq: Every day | ORAL | 11 refills | Status: DC

## 2022-03-16 NOTE — Assessment & Plan Note (Signed)
On imaging  Doesn't want statin

## 2022-03-16 NOTE — Progress Notes (Signed)
Vision Screening   Right eye Left eye Both eyes  Without correction     With correction '20/20 20/20 20/25 '$  Hearing Screening - Comments:: Has hearing aids. Not wearing them today.

## 2022-03-16 NOTE — Assessment & Plan Note (Signed)
Quiet on budesonide '9mg'$  daily

## 2022-03-16 NOTE — Progress Notes (Signed)
Subjective:    Patient ID: Kyle Ramsey, male    DOB: [birth year], 66 y.o.   MRN: [id number]  HPI Here for Medicare wellness visit and follow up of chronic health conditions Reviewed advanced directives Reviewed other doctors---Tina Hackney--CHF clinic, Dr Epifania Gore, Dr Tyrone Schimke, Dr Patel--neurology No hospitalizations or surgery in past year Vision is good--keeps up with eye doctor Hearing is fair--does have tinnitus (like crickets) No falls Occasional beer Smokes a few cigarettes---discussed cessation  No set exercise--but physical work all day No depression now that pain is better. 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 90 capsule 11   diphenoxylate-atropine (LOMOTIL) 2.5-0.025 MG tablet Take 1 tablet by mouth daily as needed for diarrhea or loose stools. 30 tablet 0   DULoxetine (CYMBALTA) 30 MG capsule Take 1 capsule (30 mg total) by mouth 2 (two) times daily. 60 capsule 11   gabapentin (NEURONTIN) 300 MG capsule TAKE 2 CAPSULES (600 MG) AT 7AM, 2 CAPS AT 11AM, 2 CAPS AT 3PM & 3 CAPSULES (900 MG) AT BEDTIME 270 capsule 11   Multiple Vitamin (MULTIVITAMIN WITH MINERALS) TABS tablet Take 1 tablet by mouth daily.     Omega-3 Fatty Acids (OMEGA 3 500 PO) Take by mouth. Omega XL - daily for arthritis     OVER THE COUNTER MEDICATION Super Beets powder     sacubitril-valsartan (ENTRESTO) 24-26  MG Take 1 tablet by mouth 2 (two) times daily. 56 tablet 0   No current facility-administered medications on file prior to visit.    Allergies  Allergen Reactions   Losartan Shortness Of Breath   Azithromycin Other (See Comments)   Codeine Nausea And Vomiting   Erythromycin Other (See Comments)    Gall bladder problem   Glipizide Other (See Comments)   Metformin Hcl Er Other (See Comments)   Onion Other (See Comments)    Past Medical History:  Diagnosis Date   CHF (congestive heart failure) ([place])    Diabetes mellitus ([place])    Hypertension    Kidney calculi     Past Surgical History:  Procedure Laterality Date   HERNIA REPAIR Bilateral 09/2020    Family History  Problem Relation Age of Onset   Diabetes Mother    Dementia Mother    Breast cancer Mother    Diabetes Father    Dementia Father     Social History   Socioeconomic History   Marital status: Single    Spouse name: Not on file   Number of children: 2   Years of education: high school   Highest education level: Not on file  Occupational History   Not on file  Tobacco Use   Smoking status: Every Day    Packs/day: 0.50    Years: 40.00    Total pack years: 20.00    Types: Cigarettes   Smokeless tobacco: Never   Tobacco comments:    1 ppd 40 years, cut back to 0.5 ppd  Vaping Use   Vaping Use: Never used  Substance and Sexual Activity   Alcohol use: Not Currently    Comment: nothing    Drug use: Never   Sexual activity: Not Currently  Other Topics Concern   Not on file  Social History Narrative   09/08/20   From: the area   Living: alone   Work: cannot work due to his neurological pain, SS income only      Family: 2 children - Gwyndolyn Saxon and [place] - 4 grandchildren      Enjoys: skeet shoot      Exercise: walking   Diet: diabetic diet      Safety   Seat belts: Yes    Guns: no   Safe in relationships: Yes       No living will   Requests son Gwyndolyn Saxon as [place]   Would accept  resuscitation   Would accept at least short term tube feeds--but not prolonged   Social Determinants of Health   Financial Resource Strain: Low Risk  (08/21/2021)   Overall Financial Resource Strain (CARDIA)    Difficulty of Paying Living Expenses: Not very hard  Food Insecurity: Not on file  Transportation Needs: Not on file  Physical Activity: Not on file  Stress: Not on file  Social Connections: Not on file  Intimate Partner Violence: Not on file   Review of Systems Appetite is good---since off diabetes medications Weight is up slightly Sleeps well Wears seat Some broken teeth---no recent dentist visit No suspicious skin lesions--recent derm visit Rare heartburn--tums/pepcid helps. No dysphagia Bowels are fine. No blood Some aches and pains--but nothing major    Objective:   Physical Exam Constitutional:      Appearance: Normal appearance.  HENT:     Mouth/Throat:     Comments: No lesions Eyes:     Conjunctiva/sclera: Conjunctivae normal.     Pupils: Pupils are equal, round, and reactive to light.  Cardiovascular:     Rate and Rhythm: Normal rate and regular rhythm.     Pulses: Normal pulses.     Heart sounds: No murmur heard.    No gallop.  Pulmonary:     Effort: Pulmonary effort is normal.     Breath sounds: Normal breath sounds. No wheezing or rales.  Abdominal:     Palpations: Abdomen is soft.     Tenderness: There is no abdominal tenderness.  Musculoskeletal:     Cervical back: Neck supple.     Right lower leg: No edema.     Left lower leg: No edema.  Lymphadenopathy:     Cervical: No cervical adenopathy.  Skin:    Findings: No lesion or rash.  Neurological:     General: No focal deficit present.     Mental Status: He is alert and oriented to person, place, and time.     Comments: Mini-cog normal Slight hyperaesthesia in feet  Psychiatric:        Mood and Affect: Mood normal.        Behavior: Behavior normal.            Assessment & Plan:

## 2022-03-16 NOTE — Assessment & Plan Note (Signed)
Hopefully still good control Didn't tolerate any meds Neuropathy controlled now

## 2022-03-16 NOTE — Assessment & Plan Note (Signed)
I have personally reviewed the Medicare Annual Wellness questionnaire and have noted 1. The patient's medical and social history 2. Their use of alcohol, tobacco or illicit drugs 3. Their current medications and supplements 4. The patient's functional ability including ADL's, fall risks, home safety risks and hearing or visual             impairment. 5. Diet and physical activities 6. Evidence for depression or mood disorders  The patients weight, height, BMI and visual acuity have been recorded in the chart I have made referrals, counseling and provided education to the patient based review of the above and I have provided the pt with a written personalized care plan for preventive services.  I have provided you with a copy of your personalized plan for preventive services. Please take the time to review along with your updated medication list.  Colon due again 2026 Will check PSA  Td at the pharmacy Must stop smoking!! Updated COVID and flu vaccines in the fall Consider shingrix at pharmacy

## 2022-03-16 NOTE — Assessment & Plan Note (Signed)
Compensated with entresto 24/26 bid Neutral fluid status

## 2022-03-16 NOTE — Assessment & Plan Note (Signed)
Controlled with gabapentin 600 tid and 900 at bedtime

## 2022-03-19 ENCOUNTER — Telehealth: Payer: Self-pay

## 2022-03-19 NOTE — Telephone Encounter (Signed)
Patient called back he does not want to start on Jardiance. He knows that the readings have increased due to increased beer. He will cut back on that.

## 2022-03-19 NOTE — Telephone Encounter (Signed)
-----   Message from Venia Carbon, MD sent at 03/17/2022 11:46 AM EDT ----- Results released Find out if he is willing to start jardiance '10mg'$  daily for diabetes and his heart. If so, send Rx and have him watch for excessive urination or a genital rash

## 2022-03-19 NOTE — Telephone Encounter (Signed)
Spoke to pt. He will do his best and if he notices he is not, he will call for the medication.

## 2022-03-19 NOTE — Telephone Encounter (Signed)
Message left on VM per DPR with lab results and to let us know if he wants to start taking Jardiance.

## 2022-03-30 ENCOUNTER — Ambulatory Visit: Payer: Medicare Other | Admitting: Family

## 2022-04-23 NOTE — Progress Notes (Unsigned)
Patient ID: Kyle Ramsey, male    DOB: [birth year], 66 y.o.   MRN: [id number]  Kyle Ramsey is a 66 y/o male with a history of DM, CKD, HTN, current tobacco use and chronic heart failure.   Echo report from 11/25/20 reviewed and showed an EF of 35-40% along with mild LAE and mild Kyle.   Has not been admitted or been in the ED in the last 6 months.    He presents today for a follow-up visit with a chief complaint of intermittent dizziness. Describes this as chronic in nature although tends to occur with sudden position changes. He has associated anxiety and gradual weight gain along with this. He denies any fatigue, cough, shortness of breath, chest pain, pedal edema, palpitations or abdominal distention.   Past Medical History:  Diagnosis Date   CHF (congestive heart failure) ([place])    Diabetes mellitus ([place])    Hypertension    Kidney calculi    Past Surgical History:  Procedure Laterality Date   HERNIA REPAIR Bilateral 09/2020   Family History  Problem Relation Age of Onset   Diabetes Mother    Dementia Mother    Breast cancer Mother    Diabetes Father    Dementia Father    Social History   Tobacco Use   Smoking status: Every Day    Packs/day: 0.50    Years: 40.00    Total pack years: 20.00    Types: Cigarettes   Smokeless tobacco: Never   Tobacco comments:    1 ppd 40 years, cut back to 0.5 ppd  Substance Use Topics   Alcohol use: Not Currently    Comment: nothing    Allergies  Allergen Reactions   Losartan Shortness Of Breath   Azithromycin Other (See Comments)   Codeine Nausea And Vomiting   Erythromycin Other (See Comments)    Gall bladder problem   Glipizide Other (See Comments)   Metformin Hcl Er Other (See Comments)   Onion Other (See Comments)   Prior to Admission medications   Medication Sig Start Date End Date Taking? Authorizing Provider  budesonide (ENTOCORT EC) 3 MG 24 hr capsule Take 3 capsules (9 mg total) by mouth daily. 03/16/22  Yes Venia Carbon, MD  DULoxetine (CYMBALTA) 30 MG capsule Take 1 capsule (30 mg total) by mouth 2 (two) times daily. 07/28/21  Yes Patel, Donika K, DO  gabapentin (NEURONTIN) 300 MG capsule TAKE 2 CAPSULES (600 MG) AT 7AM, 2 CAPS AT 11AM, 2 CAPS AT 3PM & 3 CAPSULES (900 MG) AT BEDTIME 07/28/21  Yes Patel, Donika K, DO  Multiple Vitamin (MULTIVITAMIN WITH MINERALS) TABS tablet Take 1 tablet by mouth daily.   Yes [provider]  Omega-3 Fatty Acids (OMEGA 3 500 PO) Take by mouth. Omega XL - daily for arthritis   Yes [provider]  OVER THE COUNTER MEDICATION Super Beets powder   Yes [provider]  sacubitril-valsartan (ENTRESTO) 24-26 MG Take 1 tablet by mouth 2 (two) times daily. 10/26/21  Yes Alisa Graff, FNP    Review of Systems  Constitutional:  Negative for appetite change and fatigue.  HENT:  Negative for congestion, postnasal drip and sore throat.   Eyes: Negative.   Respiratory:  Negative for cough and shortness of breath.   Cardiovascular:  Negative for chest pain, palpitations and leg swelling.  Gastrointestinal:  Negative for abdominal distention and abdominal pain.  Endocrine: Negative.   Genitourinary: Negative.   Musculoskeletal:  Negative  for arthralgias (leg pain improving) and back pain.  Skin: Negative.   Allergic/Immunologic: Negative.   Neurological:  Positive for dizziness. Negative for light-headedness.  Hematological:  Negative for adenopathy. Does not bruise/bleed easily.  Psychiatric/Behavioral:  Negative for dysphoric mood and sleep disturbance. The patient is nervous/anxious and is hyperactive.    Vitals:   04/24/22 1246  BP: (!) 174/94  Pulse: 98  Resp: 20  SpO2: 99%  Weight: 169 lb 4 oz (76.8 kg)  Height: '5\' 7"'$  (1.702 m)   Wt Readings from Last 3 Encounters:  04/24/22 169 lb 4 oz (76.8 kg)  03/16/22 168 lb (76.2 kg)  10/26/21 162 lb (73.5 kg)   Lab Results  Component Value Date   CREATININE 1.04 03/16/2022   CREATININE  1.04 06/28/2021   CREATININE 0.82 12/23/2020   Physical Exam Vitals and nursing note reviewed.  Constitutional:      General: He is not in acute distress.    Appearance: Normal appearance.  HENT:     Head: Normocephalic and atraumatic.  Neck:     Vascular: No carotid bruit.  Cardiovascular:     Rate and Rhythm: Normal rate and regular rhythm.     Pulses: Normal pulses.     Heart sounds: No murmur heard. Pulmonary:     Effort: Pulmonary effort is normal. No respiratory distress.     Breath sounds: No wheezing or rales.  Abdominal:     Palpations: Abdomen is soft.     Tenderness: There is no abdominal tenderness.  Musculoskeletal:        General: No tenderness.     Cervical back: Normal range of motion and neck supple.     Right lower leg: No edema.     Left lower leg: No edema.  Skin:    General: Skin is warm and dry.  Neurological:     General: No focal deficit present.     Mental Status: He is alert and oriented to person, place, and time.  Psychiatric:        Mood and Affect: Mood normal.        Behavior: Behavior normal.   Assessment & Plan:  1: Chronic heart failure with reduced ejection fraction- - NYHA class I - euvolemic today - weighing daily; reminded to call for an overnight weight gain of > 2 pounds or a weekly weight gain of > 5 pounds - weight up 7 lbs from last visit here 6 months ago; says that it's been a gradual weight gain - says that his home weight ranges from 155-160 pounds - on GDMT of entresto - discussed other GDMT but he is not interested in starting anything else - not adding salt  - BNP 10/31/20 was 459.0  2: HTN- - BP elevated (174/94); rechecked with manual cuff was better (152/90) - saw PCP Silvio Pate) 03/16/22 - BMP 03/16/22 reviewed and showed sodium 140, potassium 4.1, creatinine 1.04 and GFR 74.46  3: DM- - A1c 03/16/22 was 7.5% - glucose was 120 at home this morning  4: Tobacco use- - current tobacco use of 1/2 ppd - says that he  drinks 4-5 twelve ounce cans of beer daily - complete cessation encouraged  5: Myelopathy- - saw neurology Posey Pronto) 07/28/21 - taking gabapentin and cymbalta   Medication bottles reviewed.   Return in 6 months, sooner if needed.

## 2022-04-24 ENCOUNTER — Ambulatory Visit: Payer: Medicare Other | Attending: Family | Admitting: Family

## 2022-04-24 ENCOUNTER — Encounter: Payer: Self-pay | Admitting: Family

## 2022-04-24 VITALS — BP 152/90 | HR 98 | Resp 20 | Ht 67.0 in | Wt 169.2 lb

## 2022-04-24 DIAGNOSIS — E1169 Type 2 diabetes mellitus with other specified complication: Secondary | ICD-10-CM | POA: Diagnosis not present

## 2022-04-24 DIAGNOSIS — G959 Disease of spinal cord, unspecified: Secondary | ICD-10-CM | POA: Diagnosis not present

## 2022-04-24 DIAGNOSIS — I10 Essential (primary) hypertension: Secondary | ICD-10-CM

## 2022-04-24 DIAGNOSIS — I5022 Chronic systolic (congestive) heart failure: Secondary | ICD-10-CM | POA: Insufficient documentation

## 2022-04-24 DIAGNOSIS — F17210 Nicotine dependence, cigarettes, uncomplicated: Secondary | ICD-10-CM | POA: Insufficient documentation

## 2022-04-24 DIAGNOSIS — E1122 Type 2 diabetes mellitus with diabetic chronic kidney disease: Secondary | ICD-10-CM | POA: Diagnosis not present

## 2022-04-24 DIAGNOSIS — N189 Chronic kidney disease, unspecified: Secondary | ICD-10-CM | POA: Insufficient documentation

## 2022-04-24 DIAGNOSIS — I130 Hypertensive heart and chronic kidney disease with heart failure and stage 1 through stage 4 chronic kidney disease, or unspecified chronic kidney disease: Secondary | ICD-10-CM | POA: Diagnosis not present

## 2022-04-24 DIAGNOSIS — Z720 Tobacco use: Secondary | ICD-10-CM | POA: Diagnosis not present

## 2022-04-24 NOTE — Patient Instructions (Signed)
Continue weighing daily and call for an overnight weight gain of 3 pounds or more or a weekly weight gain of more than 5 pounds  If you have voicemail, please make sure your mailbox is cleaned out so that we may leave a message and please make sure to listen to any voicemails.    If you receive a satisfaction survey regarding the [facility], please take the time to fill it out. This way we can continue to provide excellent care and make any changes that need to be made.     

## 2022-04-30 ENCOUNTER — Ambulatory Visit: Payer: Medicare Other | Admitting: Neurology

## 2022-04-30 ENCOUNTER — Encounter: Payer: Self-pay | Admitting: Neurology

## 2022-04-30 VITALS — BP 144/90 | HR 80 | Ht 67.0 in | Wt 168.0 lb

## 2022-04-30 DIAGNOSIS — R202 Paresthesia of skin: Secondary | ICD-10-CM

## 2022-04-30 DIAGNOSIS — G959 Disease of spinal cord, unspecified: Secondary | ICD-10-CM | POA: Diagnosis not present

## 2022-04-30 MED ORDER — DULOXETINE HCL 30 MG PO CPEP: 30.0000 mg | ORAL_CAPSULE | Freq: Two times a day (BID) | ORAL | 11 refills | Status: DC
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
## 2022-04-30 NOTE — Patient Instructions (Signed)
Return to clinic in 9 months 

## 2022-05-01 ENCOUNTER — Encounter: Payer: Self-pay | Admitting: Neurology

## 2022-06-20 ENCOUNTER — Telehealth: Payer: Self-pay | Admitting: Family

## 2022-06-20 NOTE — Telephone Encounter (Signed)
Notified patient that his application Through [employer] for Entresto expires 12/31 and that we need him to come in to fill out a new application and bring proof of income.   Rojean Ige, NT

## 2022-06-25 ENCOUNTER — Telehealth: Payer: Self-pay | Admitting: Family

## 2022-06-25 NOTE — Telephone Encounter (Signed)
Notified patient that his application for Delene Loll is about to expire and I need him to come in with proof of income and do a new application.   Margaretmary Prisk, NT

## 2022-06-28 ENCOUNTER — Other Ambulatory Visit: Payer: Self-pay | Admitting: Family

## 2022-06-28 MED ORDER — SACUBITRIL-VALSARTAN 24-26 MG PO TABS: 1.0000 | ORAL_TABLET | Freq: Two times a day (BID) | ORAL | 3 refills | Status: DC

## 2022-06-28 NOTE — Progress Notes (Signed)
Entresto RX printed for patient assistance

## 2022-07-27 ENCOUNTER — Telehealth: Payer: Self-pay | Admitting: Family

## 2022-07-27 NOTE — Telephone Encounter (Signed)
Patient approved for Entresto until 08/13/2023   Luetta Nutting, NT

## 2022-09-17 ENCOUNTER — Ambulatory Visit: Payer: Medicare Other | Admitting: Internal Medicine

## 2022-09-24 ENCOUNTER — Ambulatory Visit: Payer: Medicare Other | Admitting: Internal Medicine

## 2022-10-01 ENCOUNTER — Ambulatory Visit: Payer: Medicare Other | Admitting: Internal Medicine

## 2022-10-08 ENCOUNTER — Ambulatory Visit (INDEPENDENT_AMBULATORY_CARE_PROVIDER_SITE_OTHER): Payer: Medicare Other | Admitting: Internal Medicine

## 2022-10-08 ENCOUNTER — Encounter: Payer: Self-pay | Admitting: Internal Medicine

## 2022-10-08 VITALS — BP 138/88 | HR 102 | Temp 98.0°F | Ht 67.0 in | Wt 174.0 lb

## 2022-10-08 DIAGNOSIS — E1142 Type 2 diabetes mellitus with diabetic polyneuropathy: Secondary | ICD-10-CM | POA: Diagnosis not present

## 2022-10-08 LAB — POCT GLYCOSYLATED HEMOGLOBIN (HGB A1C): Hemoglobin A1C: 9.7 % — AB (ref 4.0–5.6)

## 2022-10-08 MED ORDER — EMPAGLIFLOZIN 10 MG PO TABS: 10.0000 mg | ORAL_TABLET | Freq: Every day | ORAL | 11 refills | Status: DC

## 2022-10-08 NOTE — Assessment & Plan Note (Signed)
Lab Results  Component Value Date   HGBA1C 9.7 (A) 10/08/2022   Now out of control Doesn't feel he has changed his eating--he needs to be careful  He feels he has had problems with all medications---will try jardiance '10mg'$  (if can't afford or has side effects---will try low dose glipizide 2.'5mg'$  with meals---got hypoglycemic after taking and fell asleep before he could eat)

## 2022-10-08 NOTE — Patient Instructions (Signed)
Try the jardiance '10mg'$  daily. Let me know if you can't afford it or have side effects

## 2022-10-08 NOTE — Progress Notes (Signed)
Subjective:    Patient ID: Kyle Ramsey, male    DOB: 1956/03/05, 67 y.o.   MRN: [id number]  HPI Here for follow up of diabetes  Rarely checks sugars Last time a couple of months ago---125 fasting Has been consistent with his eating---no bread. Eating about the same  No chest pain No SOB No dizziness or syncope  Some balance issues due to the neuropathy  Current Outpatient Medications on File Prior to Visit  Medication Sig Dispense Refill   budesonide (ENTOCORT EC) 3 MG 24 hr capsule Take 3 capsules (9 mg total) by mouth daily. 90 capsule 11   DULoxetine (CYMBALTA) 30 MG capsule Take 1 capsule (30 mg total) by mouth 2 (two) times daily. 60 capsule 11   gabapentin (NEURONTIN) 300 MG capsule TAKE 2 CAPSULES (600 MG) AT 7AM, 2 CAPS AT 11AM, 2 CAPS AT 3PM & 3 CAPSULES (900 MG) AT BEDTIME 270 capsule 11   Multiple Vitamin (MULTIVITAMIN WITH MINERALS) TABS tablet Take 1 tablet by mouth daily.     Omega-3 Fatty Acids (OMEGA 3 500 PO) Take by mouth. Omega XL - daily for arthritis     OVER THE COUNTER MEDICATION Super Beets powder     sacubitril-valsartan (ENTRESTO) 24-26 MG Take 1 tablet by mouth 2 (two) times daily. 180 tablet 3   No current facility-administered medications on file prior to visit.    Allergies  Allergen Reactions   Losartan Shortness Of Breath   Azithromycin Other (See Comments)   Codeine Nausea And Vomiting   Erythromycin Other (See Comments)    Gall bladder problem   Glipizide Other (See Comments)   Metformin Hcl Er Other (See Comments)   Onion Other (See Comments)    Past Medical History:  Diagnosis Date   CHF (congestive heart failure) ([facility])    Diabetes mellitus ([place])    Hypertension    Kidney calculi     Past Surgical History:  Procedure Laterality Date   HERNIA REPAIR Bilateral 09/2020    Family History  Problem Relation Age of Onset   Diabetes Mother    Dementia Mother    Breast cancer Mother    Diabetes Father    Dementia Father      Social History   Socioeconomic History   Marital status: Single    Spouse name: Not on file   Number of children: 2   Years of education: high school   Highest education level: Not on file  Occupational History   Not on file  Tobacco Use   Smoking status: Every Day    Packs/day: 0.50    Years: 40.00    Total pack years: 20.00    Types: Cigarettes    Passive exposure: Past   Smokeless tobacco: Never   Tobacco comments:    1 ppd 40 years, cut back to 0.5 ppd  Vaping Use   Vaping Use: Never used  Substance and Sexual Activity   Alcohol use: Not Currently    Comment: nothing    Drug use: Never   Sexual activity: Not Currently  Other Topics Concern   Not on file  Social History Narrative   09/08/20   From: the area   Living: alone   Work: cannot work due to his neurological pain, SS income only      Family: 2 children - Gwyndolyn Saxon and Maxwell - 4 grandchildren      Enjoys: skeet shoot      Exercise: walking   Diet: diabetic diet  Safety   Seat belts: Yes    Guns: no   Safe in relationships: Yes       No living will   Requests son Gwyndolyn Saxon as [facility] POA   Would accept resuscitation   Would accept at least short term tube feeds--but not prolonged   Social Determinants of Health   Financial Resource Strain: Low Risk  (08/21/2021)   Overall Financial Resource Strain (CARDIA)    Difficulty of Paying Living Expenses: Not very hard  Food Insecurity: Not on file  Transportation Needs: Not on file  Physical Activity: Not on file  Stress: Not on file  Social Connections: Not on file  Intimate Partner Violence: Not on file   Review of Systems Weight up some---no fluid Sleeps okay Hasn't noticed excessive thirst or urination    Objective:   Physical Exam Constitutional:      Appearance: Normal appearance.  Cardiovascular:     Rate and Rhythm: Normal rate and regular rhythm.     Pulses: Normal pulses.     Heart sounds: No murmur heard.    No gallop.   Pulmonary:     Effort: Pulmonary effort is normal.     Breath sounds: Normal breath sounds. No wheezing or rales.  Musculoskeletal:     Cervical back: Neck supple.     Right lower leg: No edema.     Left lower leg: No edema.  Lymphadenopathy:     Cervical: No cervical adenopathy.  Skin:    Comments: No foot lesions  Neurological:     Mental Status: He is alert.            Assessment & Plan:

## 2022-10-16 ENCOUNTER — Telehealth: Payer: Self-pay

## 2022-10-16 NOTE — Progress Notes (Signed)
Care Management & Coordination Services Pharmacy Team  Reason for Encounter: General adherence update   Contacted patient for general health update and medication adherence call.  Unsuccessful outreach. Left voicemail for patient to return call.   Chart Updates:  Recent office visits:  10/08/22-Richard Letvak,MD(PCP)-f/u DM, start jardiance 10mg  once daily(if unaffordable,can try low dose glipizide.5mg  with meals(hypoglycemic in the past ) f/u 3 months  Recent consult visits:  04/30/22-Donika Patel,DO(neuro)-f/u paresthesia,continue cymbalta,gabapentin,f/u 9 months 04/24/22-Tina Hackney,FNP([facility] heart failure clinic)-continue daily weights,f/u 6 months   Hospital visits:  None in previous 6 months  Medications: Outpatient Encounter Medications as of 10/16/2022  Medication Sig   budesonide (ENTOCORT EC) 3 MG 24 hr capsule Take 3 capsules (9 mg total) by mouth daily.   DULoxetine (CYMBALTA) 30 MG capsule Take 1 capsule (30 mg total) by mouth 2 (two) times daily.   empagliflozin (JARDIANCE) 10 MG TABS tablet Take 1 tablet (10 mg total) by mouth daily before breakfast.   gabapentin (NEURONTIN) 300 MG capsule TAKE 2 CAPSULES (600 MG) AT 7AM, 2 CAPS AT 11AM, 2 CAPS AT 3PM & 3 CAPSULES (900 MG) AT BEDTIME   Multiple Vitamin (MULTIVITAMIN WITH MINERALS) TABS tablet Take 1 tablet by mouth daily.   Omega-3 Fatty Acids (OMEGA 3 500 PO) Take by mouth. Omega XL - daily for arthritis   OVER THE COUNTER MEDICATION Super Beets powder   sacubitril-valsartan (ENTRESTO) 24-26 MG Take 1 tablet by mouth 2 (two) times daily.   No facility-administered encounter medications on file as of 10/16/2022.    Recent vitals BP Readings from Last 3 Encounters:  10/08/22 138/88  04/30/22 (!) 144/90  04/24/22 (!) 152/90   Pulse Readings from Last 3 Encounters:  10/08/22 (!) 102  04/30/22 80  04/24/22 98   Wt Readings from Last 3 Encounters:  10/08/22 174 lb (78.9 kg)  04/30/22 168 lb (76.2 kg)  04/24/22 169  lb 4 oz (76.8 kg)   BMI Readings from Last 3 Encounters:  10/08/22 27.25 kg/m  04/30/22 26.31 kg/m  04/24/22 26.51 kg/m    Recent lab results    Component Value Date/Time   NA 140 03/16/2022 1008   K 4.1 03/16/2022 1008   CL 104 03/16/2022 1008   CO2 29 03/16/2022 1008   GLUCOSE 169 (H) 03/16/2022 1008   BUN 15 03/16/2022 1008   CREATININE 1.04 03/16/2022 1008   CREATININE 1.13 11/15/2011 1325   CALCIUM 9.2 03/16/2022 1008   CALCIUM 9.3 11/15/2011 1325    Lab Results  Component Value Date   CREATININE 1.04 03/16/2022   GFR 74.46 03/16/2022   GFRNONAA >60 12/23/2020   Lab Results  Component Value Date/Time   HGBA1C 9.7 (A) 10/08/2022 12:11 PM   HGBA1C 7.5 (H) 03/16/2022 10:08 AM   HGBA1C 6.2 06/28/2021 12:14 PM   MICROALBUR 30.2 (H) 03/16/2022 10:08 AM    Lab Results  Component Value Date   CHOL 198 03/16/2022   HDL 97.10 03/16/2022   LDLCALC 93 03/16/2022   TRIG 42.0 03/16/2022   CHOLHDL 2 03/16/2022    Care Gaps: Annual wellness visit in last year? Yes  If Diabetic: Last eye exam / retinopathy screening: never done Last diabetic foot exam:UTD Last UACR: 42.4   (03/19/22)  Star Rating Drugs:  Medication:  Last Fill: Day Supply Jardiance 10mg  10/08/22 30 Entresto 24-26mg    Charlene Brooke, PharmD notified  Avel Sensor, [place] Assistant [phone]

## 2022-11-03 NOTE — Progress Notes (Deleted)
Patient ID: Kyle Ramsey, male    DOB: Mar 07, 1956, 67 y.o.   MRN: [id number]  Kyle Ramsey is a 67 y/o male with a history of DM, CKD, HTN, current tobacco use and chronic heart failure.   Echo 11/25/20: EF of 35-40% along with mild LAE and mild Kyle.   Has not been admitted or been in the ED in the last 6 months.    He presents today for a HF follow-up visit with a chief complaint of  Past Medical History:  Diagnosis Date   CHF (congestive heart failure) ([place])    Diabetes mellitus ([place])    Hypertension    Kidney calculi    Past Surgical History:  Procedure Laterality Date   HERNIA REPAIR Bilateral 09/2020   Family History  Problem Relation Age of Onset   Diabetes Mother    Dementia Mother    Breast cancer Mother    Diabetes Father    Dementia Father    Social History   Tobacco Use   Smoking status: Every Day    Packs/day: 0.50    Years: 40.00    Additional pack years: 0.00    Total pack years: 20.00    Types: Cigarettes    Passive exposure: Past   Smokeless tobacco: Never   Tobacco comments:    1 ppd 40 years, cut back to 0.5 ppd  Substance Use Topics   Alcohol use: Not Currently    Comment: nothing    Allergies  Allergen Reactions   Losartan Shortness Of Breath   Azithromycin Other (See Comments)   Codeine Nausea And Vomiting   Erythromycin Other (See Comments)    Gall bladder problem   Glipizide Other (See Comments)   Metformin Hcl Er Other (See Comments)   Onion Other (See Comments)     Review of Systems  Constitutional:  Negative for appetite change and fatigue.  HENT:  Negative for congestion, postnasal drip and sore throat.   Eyes: Negative.   Respiratory:  Negative for cough and shortness of breath.   Cardiovascular:  Negative for chest pain, palpitations and leg swelling.  Gastrointestinal:  Negative for abdominal distention and abdominal pain.  Endocrine: Negative.   Genitourinary: Negative.   Musculoskeletal:  Negative for arthralgias (leg  pain improving) and back pain.  Skin: Negative.   Allergic/Immunologic: Negative.   Neurological:  Positive for dizziness. Negative for light-headedness.  Hematological:  Negative for adenopathy. Does not bruise/bleed easily.  Psychiatric/Behavioral:  Negative for dysphoric mood and sleep disturbance. The patient is nervous/anxious and is hyperactive.       Physical Exam Vitals and nursing note reviewed.  Constitutional:      General: He is not in acute distress.    Appearance: Normal appearance.  HENT:     Head: Normocephalic and atraumatic.  Neck:     Vascular: No carotid bruit.  Cardiovascular:     Rate and Rhythm: Normal rate and regular rhythm.     Pulses: Normal pulses.     Heart sounds: No murmur heard. Pulmonary:     Effort: Pulmonary effort is normal. No respiratory distress.     Breath sounds: No wheezing or rales.  Abdominal:     Palpations: Abdomen is soft.     Tenderness: There is no abdominal tenderness.  Musculoskeletal:        General: No tenderness.     Cervical back: Normal range of motion and neck supple.     Right lower leg: No edema.  Left lower leg: No edema.  Skin:    General: Skin is warm and dry.  Neurological:     General: No focal deficit present.     Mental Status: He is alert and oriented to person, place, and time.  Psychiatric:        Mood and Affect: Mood normal.        Behavior: Behavior normal.   Assessment & Plan:  1: Chronic heart failure with reduced ejection fraction- - NYHA class I - euvolemic today - weighing daily; reminded to call for an overnight weight gain of > 2 pounds or a weekly weight gain of > 5 pounds - weight 169.4 from last visit here 6 months ago  - says that his home weight ranges from 155-160 pounds - on GDMT of entresto - discussed other GDMT but he is not interested in starting anything else - not adding salt  - BNP 10/31/20 was 459.0  2: HTN- - BP  - saw PCP Kyle Ramsey) 10/08/22 - BMP 03/16/22 reviewed  and showed sodium 140, potassium 4.1, creatinine 1.04 and GFR 74.46  3: DM- - A1c 03/16/22 was 7.5% - glucose was   4: Tobacco use- - current tobacco use of 1/2 ppd - says that he drinks 4-5 twelve ounce cans of beer daily - complete cessation encouraged  5: Myelopathy- - saw neurology Kyle Ramsey) 07/28/21 - taking gabapentin and cymbalta

## 2022-11-05 ENCOUNTER — Other Ambulatory Visit: Payer: Self-pay | Admitting: Neurology

## 2022-11-05 ENCOUNTER — Ambulatory Visit: Payer: Medicare Other | Admitting: Family

## 2022-11-05 DIAGNOSIS — R202 Paresthesia of skin: Secondary | ICD-10-CM

## 2022-11-14 ENCOUNTER — Encounter: Payer: Self-pay | Admitting: Family

## 2022-11-14 ENCOUNTER — Telehealth: Payer: Self-pay | Admitting: Pharmacist

## 2022-11-14 ENCOUNTER — Ambulatory Visit: Payer: Medicare Other | Attending: Family | Admitting: Family

## 2022-11-14 ENCOUNTER — Encounter: Payer: Self-pay | Admitting: Pharmacist

## 2022-11-14 ENCOUNTER — Other Ambulatory Visit (HOSPITAL_COMMUNITY): Payer: Self-pay

## 2022-11-14 VITALS — BP 172/92 | HR 88 | Wt 174.6 lb

## 2022-11-14 DIAGNOSIS — N189 Chronic kidney disease, unspecified: Secondary | ICD-10-CM | POA: Insufficient documentation

## 2022-11-14 DIAGNOSIS — E1122 Type 2 diabetes mellitus with diabetic chronic kidney disease: Secondary | ICD-10-CM | POA: Diagnosis not present

## 2022-11-14 DIAGNOSIS — Z720 Tobacco use: Secondary | ICD-10-CM | POA: Insufficient documentation

## 2022-11-14 DIAGNOSIS — I10 Essential (primary) hypertension: Secondary | ICD-10-CM

## 2022-11-14 DIAGNOSIS — I130 Hypertensive heart and chronic kidney disease with heart failure and stage 1 through stage 4 chronic kidney disease, or unspecified chronic kidney disease: Secondary | ICD-10-CM | POA: Diagnosis not present

## 2022-11-14 DIAGNOSIS — G959 Disease of spinal cord, unspecified: Secondary | ICD-10-CM | POA: Insufficient documentation

## 2022-11-14 DIAGNOSIS — I5022 Chronic systolic (congestive) heart failure: Secondary | ICD-10-CM | POA: Diagnosis not present

## 2022-11-14 DIAGNOSIS — E1169 Type 2 diabetes mellitus with other specified complication: Secondary | ICD-10-CM

## 2022-11-14 MED ORDER — SACUBITRIL-VALSARTAN 24-26 MG PO TABS: 1.0000 | ORAL_TABLET | Freq: Two times a day (BID) | ORAL | 3 refills | Status: DC

## 2022-11-14 NOTE — Progress Notes (Signed)
Patient ID: Kyle Ramsey, male    DOB: [birth year], 67 y.o.   MRN: [id number]  Kyle Ramsey is a 67 y/o male with a history of DM, CKD, HTN, current tobacco use and chronic heart failure.   Echo 11/25/20: EF of 35-40% along with mild LAE and mild Kyle.   Has not been admitted or been in the ED in the last 6 months.    He presents today for a HF follow-up visit with a chief complaint of feeling "off-balance". Chronic in nature. Denies difficulty sleeping, dizziness, abdominal distention, palpitations, pedal edema, chest pain, SOB, cough or fatigue.   Home weight is running 160-165 pounds and he's been trying to eat more to get back to his normal weight. Feels like at one point, he was too skinny.   Has been started on jardiance and is tolerating this except that he states that it makes him sleepy.   Past Medical History:  Diagnosis Date   CHF (congestive heart failure) ([place])    Diabetes mellitus ([place])    Hypertension    Kidney calculi    Past Surgical History:  Procedure Laterality Date   HERNIA REPAIR Bilateral 09/2020   Family History  Problem Relation Age of Onset   Diabetes Mother    Dementia Mother    Breast cancer Mother    Diabetes Father    Dementia Father    Social History   Tobacco Use   Smoking status: Every Day    Packs/day: 0.50    Years: 40.00    Additional pack years: 0.00    Total pack years: 20.00    Types: Cigarettes    Passive exposure: Past   Smokeless tobacco: Never   Tobacco comments:    1 ppd 40 years, cut back to 0.5 ppd  Substance Use Topics   Alcohol use: Not Currently    Comment: nothing    Allergies  Allergen Reactions   Losartan Shortness Of Breath   Azithromycin Other (See Comments)   Codeine Nausea And Vomiting   Erythromycin Other (See Comments)    Gall bladder problem   Glipizide Other (See Comments)   Metformin Hcl Er Other (See Comments)   Onion Other (See Comments)   Prior to Admission medications   Medication Sig Start  Date End Date Taking? Authorizing Provider  budesonide (ENTOCORT EC) 3 MG 24 hr capsule Take 3 capsules (9 mg total) by mouth daily. 03/16/22  Yes Venia Carbon, MD  DULoxetine (CYMBALTA) 30 MG capsule Take 1 capsule (30 mg total) by mouth 2 (two) times daily. 04/30/22  Yes Patel, Donika K, DO  empagliflozin (JARDIANCE) 10 MG TABS tablet Take 1 tablet (10 mg total) by mouth daily before breakfast. 10/08/22  Yes Viviana Simpler I, MD  gabapentin (NEURONTIN) 300 MG capsule TAKE 2 CAPSULES (600 MG) AT 7AM, 2 CAPS AT 11AM, 2 CAPS AT 3PM & 3 CAPSULES (900 MG) AT BEDTIME 11/05/22  Yes Patel, Donika K, DO  Multiple Vitamin (MULTIVITAMIN WITH MINERALS) TABS tablet Take 1 tablet by mouth daily.   Yes [provider]  OVER THE COUNTER MEDICATION Super Beets powder   Yes [provider]  sacubitril-valsartan (ENTRESTO) 24-26 MG Take 1 tablet by mouth 2 (two) times daily. 11/14/22  Yes Alisa Graff, FNP   Review of Systems  Constitutional:  Negative for appetite change and fatigue.  HENT:  Negative for congestion, postnasal drip and sore throat.   Eyes: Negative.   Respiratory:  Negative for cough and  shortness of breath.   Cardiovascular:  Negative for chest pain, palpitations and leg swelling.  Gastrointestinal:  Negative for abdominal distention and abdominal pain.  Endocrine: Negative.   Genitourinary: Negative.   Musculoskeletal:  Negative for arthralgias and back pain.  Skin: Negative.   Allergic/Immunologic: Negative.   Neurological:  Positive for dizziness ("off balance"). Negative for light-headedness.  Hematological:  Negative for adenopathy. Does not bruise/bleed easily.  Psychiatric/Behavioral:  Negative for dysphoric mood and sleep disturbance. The patient is not nervous/anxious and is not hyperactive.    Vitals:   11/14/22 1243  BP: (!) 172/92  Pulse: 88  SpO2: 96%  Weight: 174 lb 9.6 oz (79.2 kg)   Wt Readings from Last 3 Encounters:  11/14/22 174 lb 9.6 oz (79.2  kg)  10/08/22 174 lb (78.9 kg)  04/30/22 168 lb (76.2 kg)   Lab Results  Component Value Date   CREATININE 1.04 03/16/2022   CREATININE 1.04 06/28/2021   CREATININE 0.82 12/23/2020   Physical Exam Vitals and nursing note reviewed.  Constitutional:      General: He is not in acute distress.    Appearance: Normal appearance.  HENT:     Head: Normocephalic and atraumatic.  Neck:     Vascular: No carotid bruit.  Cardiovascular:     Rate and Rhythm: Normal rate and regular rhythm.     Pulses: Normal pulses.     Heart sounds: No murmur heard. Pulmonary:     Effort: Pulmonary effort is normal. No respiratory distress.     Breath sounds: No wheezing or rales.  Abdominal:     Palpations: Abdomen is soft.     Tenderness: There is no abdominal tenderness.  Musculoskeletal:        General: No tenderness.     Cervical back: Normal range of motion and neck supple.     Right lower leg: No edema.     Left lower leg: No edema.  Skin:    General: Skin is warm and dry.  Neurological:     General: No focal deficit present.     Mental Status: He is alert and oriented to person, place, and time.  Psychiatric:        Mood and Affect: Mood normal.        Behavior: Behavior normal.   Assessment & Plan:  1: Chronic heart failure with reduced ejection fraction- - NYHA class I - euvolemic today - weighing daily; reminded to call for an overnight weight gain of > 2 pounds or a weekly weight gain of > 5 pounds - weight up 5 pounds from last visit here 6 months ago - home weight ranges from 160-165 pounds - echo 11/25/20: EF of 35-40% along with mild LAE and mild Kyle - have scheduled updated echo for 12/14/22  - continue entresto 24/26mg  BID - continue jardiance 10mg  daily - discussed other GDMT but he is not interested in starting anything else - not adding salt to his food - BNP 10/31/20 was 459.0 - PharmD reconciled meds w/ patient  2: HTN- - BP 172/92; was normal at PCP office last month  (138/88) - saw PCP Silvio Pate) 10/08/22; returns 01/08/23 (will need to get labs done) - BMP 03/16/22 reviewed and showed sodium 140, potassium 4.1, creatinine 1.04 and GFR 74.46  3: DM- - A1c 03/16/22 was 7.5%  4: Tobacco use- - current tobacco use of 1/2 ppd - says that he drinks 4-5 twelve ounce cans of beer daily - complete cessation encouraged  5: Myelopathy- - saw neurology Posey Pronto) 07/28/21; returns 01/29/23 - taking gabapentin and cymbalta   Return 1 week after echo, sooner if needed.

## 2022-11-14 NOTE — Telephone Encounter (Signed)
Entresto patient assistance was approved until Dec/31/2024 but new Rx needed. New Rx for Entresto 24/26mg  twice daily faxed to [employer] at [phone]

## 2022-11-15 NOTE — Progress Notes (Signed)
Patient ID: Kyle Ramsey, male   DOB: [birth year], 67 y.o.   MRN: [id number]  [facility] - HE[facility] FAILURE CLINIC - PHARMACIST COUNSELING NOTE  Guideline-Directed Medical Therapy/Evidence Based Medicine  ACE/ARB/ARNI: Sacubitril-valsartan 24-26 mg twice daily Beta Blocker:  none Aldosterone Antagonist:  none Diuretic:  none SGLT2i: Empagliflozin 10 mg daily  Adherence Assessment  Do you ever forget to take your medication? [] Yes [x] No  Do you ever skip doses due to side effects? [] Yes [x] No  Do you have trouble affording your medicines? [] Yes [x] No  Are you ever unable to pick up your medication due to transportation difficulties? [] Yes [x] No  Do you ever stop taking your medications because you don't believe they are helping? [] Yes [x] No  Do you check your weight daily? [] Yes [x] No   Adherence strategy: none  Barriers to obtaining medications: none (gets Entresto patient assistance)  Vital signs: HR 88, BP 172/92, weight (pounds) 174 lbs (stable)  ECHO 11/25/20: EF of 35-40% along with mild LAE and mild MR      Latest Ref Rng & Units 03/16/2022   10:08 AM 06/28/2021   12:14 PM 12/23/2020    3:04 PM  BMP  Glucose 70 - 99 mg/dL 655  86  374   BUN 6 - 23 mg/dL 15  15  17    Creatinine 0.40 - 1.50 mg/dL 8.27  0.78  6.75   Sodium 135 - 145 mEq/L 140  141  139   Potassium 3.5 - 5.1 mEq/L 4.1  3.5  4.4   Chloride 96 - 112 mEq/L 104  105  105   CO2 19 - 32 mEq/L 29  29  28    Calcium 8.4 - 10.5 mg/dL 9.2  8.8  9.1     Past Medical History:  Diagnosis Date   CHF (congestive heart failure)    Diabetes mellitus    Hypertension    Kidney calculi     ASSESSMENT 67 year old male who presents to the HF clinic for follow up. PMH includes DM, CKD, HTN, current tobacco use and chronic heart failure. Pasl ED admission was > 6 months ago. Patient denies dizziness, abdominal distention, palpitations, pedal edema, chest pain, SOB, cough or fatigue.  PLAN  (recommendations)  Increase Entresto to 49/51 mg BID, and add BB to GDMT Add MRA during next visit Repeat BMET in 1 weeks Follow up per NP instructions   Time spent: 15 minutes Kyle Ramsey PharmD, BCPS 11/15/2022 1:51 PM   Current Outpatient Medications:    budesonide (ENTOCORT EC) 3 MG 24 hr capsule, Take 3 capsules (9 mg total) by mouth daily., Disp: 90 capsule, Rfl: 11   DULoxetine (CYMBALTA) 30 MG capsule, Take 1 capsule (30 mg total) by mouth 2 (two) times daily., Disp: 60 capsule, Rfl: 11   empagliflozin (JARDIANCE) 10 MG TABS tablet, Take 1 tablet (10 mg total) by mouth daily before breakfast., Disp: 30 tablet, Rfl: 11   gabapentin (NEURONTIN) 300 MG capsule, TAKE 2 CAPSULES (600 MG) AT 7AM, 2 CAPS AT 11AM, 2 CAPS AT 3PM & 3 CAPSULES (900 MG) AT BEDTIME, Disp: 270 capsule, Rfl: 11   Multiple Vitamin (MULTIVITAMIN WITH MINERALS) TABS tablet, Take 1 tablet by mouth daily., Disp: , Rfl:    OVER THE COUNTER MEDICATION, Super Beets powder, Disp: , Rfl:    sacubitril-valsartan (ENTRESTO) 24-26 MG, Take 1 tablet by mouth 2 (two) times daily., Disp: 180 tablet, Rfl: 3    MEDICATION ADHERENCES TIPS AND STRATEGIES Taking medication as prescribed  improves patient outcomes in heart failure (reduces hospitalizations, improves symptoms, increases survival) Side effects of medications can be managed by decreasing doses, switching agents, stopping drugs, or adding additional therapy. Please let someone in the Heart Failure Clinic know if you have having bothersome side effects so we can modify your regimen. Do not alter your medication regimen without talking to [place].  Medication reminders can help patients remember to take drugs on time. If you are missing or forgetting doses you can try linking behaviors, using pill boxes, or an electronic reminder like an alarm on your phone or an app. Some people can also get automated phone calls as medication reminders.

## 2022-11-27 ENCOUNTER — Telehealth: Payer: Self-pay

## 2022-11-27 NOTE — Telephone Encounter (Signed)
Pt called because he had not received his Entresto medication. RN called [employer] [phone]) to ensure that they had received all the paperwork.  [employer] did received all the paperwork needed. They will expedite the order and pt will be receiving Entresto 24/26 MG by tomorrow.  Called pt to make aware.

## 2022-12-13 ENCOUNTER — Telehealth: Payer: Self-pay

## 2022-12-13 NOTE — Telephone Encounter (Signed)
Pt called to cancel his echocardiogram scheduled for tomorrow, 5/3 at 9 am, and to have it rescheduled. Informed Barbara in scheduling. She will call pt to reschedule echo.

## 2022-12-14 ENCOUNTER — Ambulatory Visit: Payer: Medicare Other

## 2022-12-25 ENCOUNTER — Encounter: Payer: Medicare Other | Admitting: Family

## 2023-01-08 ENCOUNTER — Ambulatory Visit: Payer: Medicare Other | Admitting: Internal Medicine

## 2023-01-17 ENCOUNTER — Ambulatory Visit (HOSPITAL_BASED_OUTPATIENT_CLINIC_OR_DEPARTMENT_OTHER): Payer: Medicare Other | Admitting: Family

## 2023-01-17 ENCOUNTER — Encounter: Payer: Self-pay | Admitting: Pharmacist

## 2023-01-17 ENCOUNTER — Encounter: Payer: Self-pay | Admitting: Family

## 2023-01-17 ENCOUNTER — Ambulatory Visit
Admission: RE | Admit: 2023-01-17 | Discharge: 2023-01-17 | Disposition: A | Discharge status: Home / Self Care | Payer: Medicare Other | Source: Ambulatory Visit | Attending: Family | Admitting: Family

## 2023-01-17 VITALS — BP 154/80 | HR 82 | Wt 170.0 lb

## 2023-01-17 DIAGNOSIS — F17210 Nicotine dependence, cigarettes, uncomplicated: Secondary | ICD-10-CM | POA: Diagnosis not present

## 2023-01-17 DIAGNOSIS — E1169 Type 2 diabetes mellitus with other specified complication: Secondary | ICD-10-CM | POA: Diagnosis not present

## 2023-01-17 DIAGNOSIS — G959 Disease of spinal cord, unspecified: Secondary | ICD-10-CM | POA: Diagnosis not present

## 2023-01-17 DIAGNOSIS — I5022 Chronic systolic (congestive) heart failure: Secondary | ICD-10-CM

## 2023-01-17 DIAGNOSIS — Z79899 Other long term (current) drug therapy: Secondary | ICD-10-CM | POA: Insufficient documentation

## 2023-01-17 DIAGNOSIS — I10 Essential (primary) hypertension: Secondary | ICD-10-CM | POA: Diagnosis not present

## 2023-01-17 DIAGNOSIS — N189 Chronic kidney disease, unspecified: Secondary | ICD-10-CM | POA: Insufficient documentation

## 2023-01-17 DIAGNOSIS — I130 Hypertensive heart and chronic kidney disease with heart failure and stage 1 through stage 4 chronic kidney disease, or unspecified chronic kidney disease: Secondary | ICD-10-CM | POA: Diagnosis not present

## 2023-01-17 DIAGNOSIS — Z720 Tobacco use: Secondary | ICD-10-CM | POA: Diagnosis not present

## 2023-01-17 DIAGNOSIS — E1122 Type 2 diabetes mellitus with diabetic chronic kidney disease: Secondary | ICD-10-CM | POA: Diagnosis not present

## 2023-01-17 DIAGNOSIS — I428 Other cardiomyopathies: Secondary | ICD-10-CM | POA: Diagnosis not present

## 2023-01-17 LAB — ECHOCARDIOGRAM COMPLETE
AR max vel: 2 cm2
AV Area VTI: 2.61 cm2
AV Area mean vel: 1.9 cm2
AV Mean grad: 4.5 mmHg
AV Peak grad: 7.7 mmHg
Ao pk vel: 1.39 m/s
Area-P 1/2: 3.74 cm2
Calc EF: 58.3 %
S' Lateral: 4 cm
Single Plane A2C EF: 51.1 %
Single Plane A4C EF: 61.4 %

## 2023-01-17 NOTE — Progress Notes (Signed)
PCP: Tillman Abide, MD (last seen 02/24) Primary Cardiologist: none  HPI:  Kyle Ramsey is a 67 y/o male with a history of DM, CKD, HTN, current tobacco use and chronic heart failure.   EF Echo 11/25/20: EF of 35-40% along with mild LAE and mild Kyle.   Has not been admitted or been in the ED in the last 6 months.    He presents today with a chief complaint of a follow-up visit. Currently denies fatigue, SOB, cough, chest pain, pedal edema, palpitations, abdominal distention, dizziness or difficulty sleeping. Had echo done earlier today but no results yet.  ROS: All systems negative except as listed in HPI, PMH and Problem List.  SH:  Social History   Socioeconomic History   Marital status: Single    Spouse name: Not on file   Number of children: 2   Years of education: high school   Highest education level: Not on file  Occupational History   Not on file  Tobacco Use   Smoking status: Every Day    Packs/day: 0.50    Years: 40.00    Additional pack years: 0.00    Total pack years: 20.00    Types: Cigarettes    Passive exposure: Past   Smokeless tobacco: Never   Tobacco comments:    1 ppd 40 years, cut back to 0.5 ppd  Vaping Use   Vaping Use: Never used  Substance and Sexual Activity   Alcohol use: Not Currently    Comment: nothing    Drug use: Never   Sexual activity: Not Currently  Other Topics Concern   Not on file  Social History Narrative   09/08/20   From: the area   Living: alone   Work: cannot work due to his neurological pain, SS income only      Family: 2 children - Chrissie Noa and [place] - 4 grandchildren      Enjoys: skeet shoot      Exercise: walking   Diet: diabetic diet      Safety   Seat belts: Yes    Guns: no   Safe in relationships: Yes       No living will   Requests son Chrissie Noa as [facility] POA   Would accept resuscitation   Would accept at least short term tube feeds--but not prolonged   Social Determinants of Health   Financial Resource  Strain: Low Risk  (08/21/2021)   Overall Financial Resource Strain (CARDIA)    Difficulty of Paying Living Expenses: Not very hard  Food Insecurity: Not on file  Transportation Needs: Not on file  Physical Activity: Not on file  Stress: Not on file  Social Connections: Not on file  Intimate Partner Violence: Not on file    FH:  Family History  Problem Relation Age of Onset   Diabetes Mother    Dementia Mother    Breast cancer Mother    Diabetes Father    Dementia Father     Past Medical History:  Diagnosis Date   CHF (congestive heart failure) ([facility])    Diabetes mellitus ([facility])    Hypertension    Kidney calculi     Current Outpatient Medications  Medication Sig Dispense Refill   budesonide (ENTOCORT EC) 3 MG 24 hr capsule Take 3 capsules (9 mg total) by mouth daily. 90 capsule 11   DULoxetine (CYMBALTA) 30 MG capsule Take 1 capsule (30 mg total) by mouth 2 (two) times daily. 60 capsule 11   empagliflozin (JARDIANCE)  10 MG TABS tablet Take 1 tablet (10 mg total) by mouth daily before breakfast. 30 tablet 11   gabapentin (NEURONTIN) 300 MG capsule TAKE 2 CAPSULES (600 MG) AT 7AM, 2 CAPS AT 11AM, 2 CAPS AT 3PM & 3 CAPSULES (900 MG) AT BEDTIME 270 capsule 11   Multiple Vitamin (MULTIVITAMIN WITH MINERALS) TABS tablet Take 1 tablet by mouth daily.     OVER THE COUNTER MEDICATION Super Beets powder     sacubitril-valsartan (ENTRESTO) 24-26 MG Take 1 tablet by mouth 2 (two) times daily. 180 tablet 3   No current facility-administered medications for this visit.   Vitals:   01/17/23 1104  BP: (!) 154/80  Pulse: 82  SpO2: 98%  Weight: 170 lb (77.1 kg)   Wt Readings from Last 3 Encounters:  01/17/23 170 lb (77.1 kg)  11/14/22 174 lb 9.6 oz (79.2 kg)  10/08/22 174 lb (78.9 kg)   Lab Results  Component Value Date   CREATININE 1.04 03/16/2022   CREATININE 1.04 06/28/2021   CREATININE 0.82 12/23/2020   PHYSICAL EXAM:  General:  Well appearing. No resp difficulty HEENT:  normal Neck: supple. JVP flat. No lymphadenopathy or thryomegaly appreciated. Cor: PMI normal. Regular rate & rhythm. No rubs, gallops or murmurs. Lungs: clear Abdomen: soft, nontender, nondistended. No hepatosplenomegaly. No bruits or masses.  Extremities: no cyanosis, clubbing, rash, edema Neuro: alert & orientedx3, cranial nerves grossly intact. Moves all 4 extremities w/o difficulty. Affect pleasant.   ECG: not done   ASSESSMENT & PLAN:  1: NICM with reduced ejection fraction- - most likely due to HTN but can't rule out ischemic component  - NYHA class I - euvolemic today - weighing daily; reminded to call for an overnight weight gain of > 2 pounds or a weekly weight gain of > 5 pounds - weight down 4 pounds from last visit here 2 months ago - echo 11/25/20: EF of 35-40% along with mild LAE and mild Kyle - echo done earlier today - continue entresto 24/26mg  BID - continue jardiance 10mg  daily - discussed other GDMT but he is not interested in starting anything else - not adding salt to his food - BNP 10/31/20 was 459.0 - PharmD reconciled meds w/ patient  2: HTN- - BP 154/80 - saw PCP Alphonsus Sias) 02/24; returns next week - BMP 03/16/22 reviewed and showed sodium 140, potassium 4.1, creatinine 1.04 and GFR 74.46  3: DM- - A1c 03/16/22 was 7.5%  4: Tobacco use- - current tobacco use of 1/2 ppd - says that he drinks 4-5 twelve ounce cans of beer daily - complete cessation encouraged  5: Myelopathy- - saw neurology Allena Katz) 12/22; returns 06/24 - taking gabapentin and cymbalta  Will call patient with echo results. Unless something is drastically different with echo, he opts to return just if he needs [place]. If not following here, would benefit from getting established with cardiology group.

## 2023-01-17 NOTE — Progress Notes (Signed)
*  PRELIMINARY RESULTS* Echocardiogram 2D Echocardiogram has been performed.  Kyle Ramsey 01/17/2023, 10:48 AM

## 2023-01-18 ENCOUNTER — Telehealth: Payer: Self-pay

## 2023-01-18 NOTE — Telephone Encounter (Signed)
-----   Message from Delma Freeze, [place] sent at 01/17/2023  3:46 PM EDT ----- Heart function is much improved from last time it was checked 04/22. It is important that your blood pressure gets under control. Continue your current medications at this time.

## 2023-01-18 NOTE — Progress Notes (Signed)
Called and notified of the following per Clarisa Kindred, FNP:  Heart function is much improved from last time it was checked 04/22. It is important that your blood pressure gets under control. Continue your current medications at this time.   Pt had good understanding and no further questions.

## 2023-01-22 ENCOUNTER — Ambulatory Visit: Payer: Medicare Other | Admitting: Internal Medicine

## 2023-01-29 ENCOUNTER — Ambulatory Visit: Payer: Medicare Other | Admitting: Neurology

## 2023-01-29 ENCOUNTER — Ambulatory Visit: Payer: Medicare Other | Admitting: Internal Medicine

## 2023-01-30 NOTE — Progress Notes (Signed)
Patient ID: Kyle Ramsey, male   DOB: 1955/11/02, 67 y.o.   MRN: [id number]  [facility] - HEART FAILURE CLINIC - PHARMACIST COUNSELING NOTE  Guideline-Directed Medical Therapy/Evidence Based Medicine  ACE/ARB/ARNI: Sacubitril-valsartan 24-26 mg twice daily Beta Blocker:  none Aldosterone Antagonist:  none Diuretic:  none SGLT2i: Empagliflozin 10 mg daily  Adherence Assessment  Do you ever forget to take your medication? [] Yes [x] No  Do you ever skip doses due to side effects? [] Yes [x] No  Do you have trouble affording your medicines? [] Yes [x] No  Are you ever unable to pick up your medication due to transportation difficulties? [] Yes [x] No  Do you ever stop taking your medications because you don't believe they are helping? [] Yes [x] No  Do you check your weight daily? [x] Yes [] No   Adherence strategy: none  Barriers to obtaining medications: Prior authorization for Entresto renewed.  Vital signs: HR 82, BP 154/80, weight (pounds) 170  ECHO: 11/25/20: EF of 35-40% along with mild LAE and mild MR      Latest Ref Rng & Units 03/16/2022   10:08 AM 06/28/2021   12:14 PM 12/23/2020    3:04 PM  BMP  Glucose 70 - 99 mg/dL 782  86  956   BUN 6 - 23 mg/dL 15  15  17    Creatinine 0.40 - 1.50 mg/dL 2.13  0.86  5.78   Sodium 135 - 145 mEq/L 140  141  139   Potassium 3.5 - 5.1 mEq/L 4.1  3.5  4.4   Chloride 96 - 112 mEq/L 104  105  105   CO2 19 - 32 mEq/L 29  29  28    Calcium 8.4 - 10.5 mg/dL 9.2  8.8  9.1     Past Medical History:  Diagnosis Date   CHF (congestive heart failure) (HCC)    Diabetes mellitus (HCC)    Hypertension    Kidney calculi     ASSESSMENT 67 year old male who presents to the HF clinic for follow up. PMH includes DM, CKD, HTN, current tobacco use and chronic heart failure. No admissions to ED noted in > 6 months.   Medication reconciliation completed. Noted Entresto price up during last refill, able to renew prior-authorization  and decrease cost during OV. Patient not interested in additional meds (GDMT), or smoking cessation.   PLAN  MRA recommended - patient not interested on adding new meds Prior authorization for Entresto renewed Follow up per provider  Time spent: 15 minutes  Theia Dezeeuw Rodriguez-Guzman, PharmD, CPP Clinical Pharmacist 01/30/2023 11:07 AM   Current Outpatient Medications:    budesonide (ENTOCORT EC) 3 MG 24 hr capsule, Take 3 capsules (9 mg total) by mouth daily., Disp: 90 capsule, Rfl: 11   DULoxetine (CYMBALTA) 30 MG capsule, Take 1 capsule (30 mg total) by mouth 2 (two) times daily., Disp: 60 capsule, Rfl: 11   empagliflozin (JARDIANCE) 10 MG TABS tablet, Take 1 tablet (10 mg total) by mouth daily before breakfast., Disp: 30 tablet, Rfl: 11   gabapentin (NEURONTIN) 300 MG capsule, TAKE 2 CAPSULES (600 MG) AT 7AM, 2 CAPS AT 11AM, 2 CAPS AT 3PM & 3 CAPSULES (900 MG) AT BEDTIME, Disp: 270 capsule, Rfl: 11   Multiple Vitamin (MULTIVITAMIN WITH MINERALS) TABS tablet, Take 1 tablet by mouth daily., Disp: , Rfl:    OVER THE COUNTER MEDICATION, Super Beets powder, Disp: , Rfl:    sacubitril-valsartan (ENTRESTO) 24-26 MG, Take 1 tablet by mouth 2 (two) times daily., Disp: 180 tablet, Rfl:  3   MEDICATION ADHERENCES TIPS AND STRATEGIES Taking medication as prescribed improves patient outcomes in heart failure (reduces hospitalizations, improves symptoms, increases survival) Side effects of medications can be managed by decreasing doses, switching agents, stopping drugs, or adding additional therapy. Please let someone in the Heart Failure Clinic know if you have having bothersome side effects so we can modify your regimen. Do not alter your medication regimen without talking to [place].  Medication reminders can help patients remember to take drugs on time. If you are missing or forgetting doses you can try linking behaviors, using pill boxes, or an electronic reminder like an alarm on your phone or an app.  Some people can also get automated phone calls as medication reminders.

## 2023-02-01 ENCOUNTER — Ambulatory Visit: Payer: Medicare Other | Admitting: Neurology

## 2023-02-05 ENCOUNTER — Ambulatory Visit: Payer: Medicare Other | Admitting: Neurology

## 2023-02-06 ENCOUNTER — Ambulatory Visit: Payer: Medicare Other | Admitting: Internal Medicine

## 2023-02-12 ENCOUNTER — Ambulatory Visit: Payer: Medicare Other | Admitting: Neurology

## 2023-02-13 ENCOUNTER — Encounter: Payer: Self-pay | Admitting: Neurology

## 2023-02-13 ENCOUNTER — Ambulatory Visit: Payer: Medicare Other | Admitting: Neurology

## 2023-02-13 DIAGNOSIS — R202 Paresthesia of skin: Secondary | ICD-10-CM | POA: Diagnosis not present

## 2023-02-13 MED ORDER — DULOXETINE HCL 30 MG PO CPEP: 30.0000 mg | ORAL_CAPSULE | Freq: Two times a day (BID) | ORAL | 11 refills | Status: DC

## 2023-02-13 MED ORDER — GABAPENTIN 300 MG PO CAPS: 300.0000 mg | ORAL_CAPSULE | Freq: Two times a day (BID) | ORAL | 11 refills | Status: DC

## 2023-02-13 NOTE — Patient Instructions (Signed)
It was great to see you today!  Return to clinic in 1 year 
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
## 2023-02-19 ENCOUNTER — Ambulatory Visit: Payer: Medicare Other | Admitting: Internal Medicine

## 2023-02-22 ENCOUNTER — Ambulatory Visit: Payer: Medicare Other | Admitting: Internal Medicine

## 2023-02-27 ENCOUNTER — Ambulatory Visit: Payer: Medicare Other | Admitting: Internal Medicine

## 2023-03-06 ENCOUNTER — Ambulatory Visit (INDEPENDENT_AMBULATORY_CARE_PROVIDER_SITE_OTHER): Payer: Medicare Other | Admitting: Internal Medicine

## 2023-03-06 ENCOUNTER — Encounter: Payer: Self-pay | Admitting: Internal Medicine

## 2023-03-06 VITALS — BP 138/86 | HR 85 | Temp 97.5°F | Ht 67.0 in | Wt 167.0 lb

## 2023-03-06 DIAGNOSIS — E1142 Type 2 diabetes mellitus with diabetic polyneuropathy: Secondary | ICD-10-CM | POA: Diagnosis not present

## 2023-03-06 DIAGNOSIS — Z7984 Long term (current) use of oral hypoglycemic drugs: Secondary | ICD-10-CM

## 2023-03-06 DIAGNOSIS — G629 Polyneuropathy, unspecified: Secondary | ICD-10-CM | POA: Diagnosis not present

## 2023-03-06 DIAGNOSIS — I5022 Chronic systolic (congestive) heart failure: Secondary | ICD-10-CM | POA: Diagnosis not present

## 2023-03-06 LAB — POCT GLYCOSYLATED HEMOGLOBIN (HGB A1C): Hemoglobin A1C: 7.7 % — AB (ref 4.0–5.6)

## 2023-03-06 NOTE — Assessment & Plan Note (Signed)
EF is much improved--only mildly down Still on the entresto 24/26 bid---might be able to consider stopping the sacubitril over time (leave to CHF clinic)

## 2023-03-06 NOTE — Assessment & Plan Note (Addendum)
Lab Results  Component Value Date   HGBA1C 7.7 (A) 03/06/2023   Has improved lifestyle Doing well on jardiance 10mg  ---much improved Still uses the gabapentin at night--rarely in the day Also on the duloxetine

## 2023-03-06 NOTE — Progress Notes (Signed)
Subjective:    Patient ID: Kyle Ramsey, male    DOB: Apr 21, 1956, 67 y.o.   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 90 capsule 11   DULoxetine (CYMBALTA) 30 MG capsule Take 1 capsule (30 mg total) by mouth 2 (two) times daily. 60 capsule 11   empagliflozin (JARDIANCE) 10 MG TABS tablet Take 1 tablet (10 mg total) by mouth daily before breakfast. 30 tablet 11   gabapentin (NEURONTIN) 300 MG capsule Take 1 capsule (300 mg total) by mouth 2 (two) times daily. 60 capsule 11   Multiple Vitamin (MULTIVITAMIN WITH MINERALS) TABS tablet Take 1 tablet by mouth daily.     OVER THE COUNTER MEDICATION Super Beets powder     sacubitril-valsartan (ENTRESTO) 24-26 MG Take 1 tablet by mouth 2 (two) times daily. 180 tablet 3   No current facility-administered medications on file prior to visit.    Allergies  Allergen Reactions   Losartan Shortness Of Breath   Azithromycin Other (See Comments)   Codeine Nausea And Vomiting   Erythromycin Other (See Comments)    Gall bladder problem   Glipizide Other (See Comments)   Metformin Hcl Er Other (See Comments)   Onion Other (See Comments)    Past Medical History:  Diagnosis Date   CHF (congestive heart failure) (HCC)    Diabetes mellitus (HCC)    Hypertension    Kidney calculi     Past Surgical History:  Procedure Laterality Date   HERNIA REPAIR Bilateral 09/2020    Family History  Problem Relation Age of Onset   Diabetes  Mother    Dementia Mother    Breast cancer Mother    Diabetes Father    Dementia Father     Social History   Socioeconomic History   Marital status: Single    Spouse name: Not on file   Number of children: 2   Years of education: high school   Highest education level: Not on file  Occupational History   Not on file  Tobacco Use   Smoking status: Every Day    Current packs/day: 0.50    Average packs/day: 0.5 packs/day for 40.0 years (20.0 ttl pk-yrs)    Types: Cigarettes    Passive exposure: Past   Smokeless tobacco: Never   Tobacco comments:    1 ppd 40 years, cut back to 0.5 ppd  Vaping Use   Vaping status: Never Used  Substance and Sexual Activity   Alcohol use: Not Currently    Comment: nothing    Drug use: Never   Sexual activity: Not Currently  Other Topics Concern   Not on file  Social History Narrative   09/08/20   From: the area   Living: alone   Work: cannot work due to his neurological pain, SS income only      Family: 2 children - Chrissie Noa and Fayrene Fearing - 4 grandchildren      Enjoys: skeet shoot  Exercise: walking   Diet: diabetic diet      Safety   Seat belts: Yes    Guns: no   Safe in relationships: Yes       No living will   Requests son Chrissie Noa as [facility] POA   Would accept resuscitation   Would accept at least short term tube feeds--but not prolonged   Social Determinants of Health   Financial Resource Strain: Low Risk  (08/21/2021)   Overall Financial Resource Strain (CARDIA)    Difficulty of Paying Living Expenses: Not very hard  Food Insecurity: Not on file  Transportation Needs: Not on file  Physical Activity: Not on file  Stress: Not on file  Social Connections: Not on file  Intimate Partner Violence: Not on file   Review of Systems Sleeping well Weight down 7# since February Some left foot tingling--no pain and this isn't consistent     Objective:   Physical Exam Constitutional:      Appearance: Normal appearance.   Cardiovascular:     Rate and Rhythm: Normal rate and regular rhythm.     Pulses: Normal pulses.     Heart sounds: No murmur heard.    No gallop.  Pulmonary:     Effort: Pulmonary effort is normal.     Breath sounds: Normal breath sounds. No wheezing or rales.  Musculoskeletal:     Cervical back: Neck supple.     Right lower leg: No edema.     Left lower leg: No edema.  Lymphadenopathy:     Cervical: No cervical adenopathy.  Skin:    Comments: No foot lesions  Neurological:     Mental Status: He is alert.     Comments: Normal sensation in feet            Assessment & Plan:

## 2023-03-06 NOTE — Assessment & Plan Note (Signed)
On the gabapentin and duloxetine Sees Dr Allena Katz

## 2023-04-12 ENCOUNTER — Other Ambulatory Visit: Payer: Self-pay | Admitting: Internal Medicine

## 2023-04-17 ENCOUNTER — Other Ambulatory Visit: Payer: Self-pay | Admitting: Internal Medicine

## 2023-06-26 ENCOUNTER — Other Ambulatory Visit: Payer: Self-pay | Admitting: Internal Medicine

## 2023-07-26 ENCOUNTER — Telehealth: Payer: Self-pay

## 2023-07-26 NOTE — Telephone Encounter (Signed)
Left message on VM for pt to call and schedule a Medicare Wellness Exam with Dr Alphonsus Sias after 09-06-23.

## 2023-09-06 ENCOUNTER — Ambulatory Visit: Payer: Medicare Other | Admitting: Internal Medicine

## 2023-09-06 ENCOUNTER — Encounter: Payer: Medicare Other | Admitting: Internal Medicine

## 2023-09-09 ENCOUNTER — Encounter: Payer: Medicare Other | Admitting: Internal Medicine

## 2023-09-12 ENCOUNTER — Encounter: Payer: Self-pay | Admitting: Internal Medicine

## 2023-09-12 ENCOUNTER — Ambulatory Visit: Payer: Medicare Other | Admitting: Internal Medicine

## 2023-09-12 VITALS — BP 114/80 | HR 85 | Temp 98.1°F | Ht 66.5 in | Wt 168.0 lb

## 2023-09-12 DIAGNOSIS — Z0000 Encounter for general adult medical examination without abnormal findings: Secondary | ICD-10-CM

## 2023-09-12 DIAGNOSIS — Z7984 Long term (current) use of oral hypoglycemic drugs: Secondary | ICD-10-CM | POA: Diagnosis not present

## 2023-09-12 DIAGNOSIS — I700 Atherosclerosis of aorta: Secondary | ICD-10-CM | POA: Diagnosis not present

## 2023-09-12 DIAGNOSIS — K52832 Lymphocytic colitis: Secondary | ICD-10-CM

## 2023-09-12 DIAGNOSIS — E1142 Type 2 diabetes mellitus with diabetic polyneuropathy: Secondary | ICD-10-CM | POA: Diagnosis not present

## 2023-09-12 DIAGNOSIS — I5022 Chronic systolic (congestive) heart failure: Secondary | ICD-10-CM | POA: Diagnosis not present

## 2023-09-12 DIAGNOSIS — Z125 Encounter for screening for malignant neoplasm of prostate: Secondary | ICD-10-CM | POA: Diagnosis not present

## 2023-09-12 DIAGNOSIS — E119 Type 2 diabetes mellitus without complications: Secondary | ICD-10-CM | POA: Insufficient documentation

## 2023-09-12 LAB — MICROALBUMIN / CREATININE URINE RATIO
Creatinine,U: 76.6 mg/dL
Microalb Creat Ratio: 23.6 mg/g (ref 0.0–30.0)
Microalb, Ur: 18.1 mg/dL — ABNORMAL HIGH (ref 0.0–1.9)

## 2023-09-12 LAB — COMPREHENSIVE METABOLIC PANEL
ALT: 16 U/L (ref 0–53)
AST: 14 U/L (ref 0–37)
Albumin: 4.1 g/dL (ref 3.5–5.2)
Alkaline Phosphatase: 73 U/L (ref 39–117)
BUN: 16 mg/dL (ref 6–23)
CO2: 27 meq/L (ref 19–32)
Calcium: 9 mg/dL (ref 8.4–10.5)
Chloride: 101 meq/L (ref 96–112)
Creatinine, Ser: 1.1 mg/dL (ref 0.40–1.50)
GFR: 68.89 mL/min (ref >60.00)
Glucose, Bld: 249 mg/dL — ABNORMAL HIGH (ref 70–99)
Potassium: 4.2 meq/L (ref 3.5–5.1)
Sodium: 137 meq/L (ref 135–145)
Total Bilirubin: 1.2 mg/dL (ref 0.2–1.2)
Total Protein: 6 g/dL (ref 6.0–8.3)

## 2023-09-12 LAB — CBC
HCT: 47.4 % (ref 39.0–52.0)
Hemoglobin: 16 g/dL (ref 13.0–17.0)
MCHC: 33.8 g/dL (ref 30.0–36.0)
MCV: 100.7 fL — ABNORMAL HIGH (ref 78.0–100.0)
Platelets: 218 10*3/uL (ref 150.0–400.0)
RBC: 4.7 Mil/uL (ref 4.22–5.81)
RDW: 12.9 % (ref 11.5–15.5)
WBC: 6.5 10*3/uL (ref 4.0–10.5)

## 2023-09-12 LAB — HEMOGLOBIN A1C: Hgb A1c MFr Bld: 11.6 % — ABNORMAL HIGH (ref 4.6–6.5)

## 2023-09-12 LAB — HM DIABETES FOOT EXAM

## 2023-09-12 LAB — LIPID PANEL
Cholesterol: 178 mg/dL (ref 0–200)
HDL: 63.9 mg/dL (ref >39.00)
LDL Cholesterol: 103 mg/dL — ABNORMAL HIGH (ref 0–99)
NonHDL: 114.23
Total CHOL/HDL Ratio: 3
Triglycerides: 55 mg/dL (ref 0.0–149.0)
VLDL: 11 mg/dL (ref 0.0–40.0)

## 2023-09-12 LAB — PSA, MEDICARE: PSA: 2.46 ng/mL (ref 0.10–4.00)

## 2023-09-12 MED ORDER — BUDESONIDE 3 MG PO CPEP: 3.0000 mg | ORAL_CAPSULE | Freq: Every day | ORAL | 3 refills | Status: DC

## 2023-09-12 NOTE — Assessment & Plan Note (Signed)
On imaging Not tolerant of statins

## 2023-09-12 NOTE — Assessment & Plan Note (Signed)
Controlled with daily budesonide--will refill

## 2023-09-12 NOTE — Progress Notes (Signed)
Vision Screening   Right eye Left eye Both eyes  Without correction     With correction 20/25 20/25 20/25   Hearing Screening - Comments:: Has hearing aids. Not wearing them today

## 2023-09-12 NOTE — Assessment & Plan Note (Addendum)
I have personally reviewed the Medicare Annual Wellness questionnaire and have noted 1. The patient's medical and social history 2. Their use of alcohol, tobacco or illicit drugs 3. Their current medications and supplements 4. The patient's functional ability including ADL's, fall risks, home safety risks and hearing or visual             impairment. 5. Diet and physical activities 6. Evidence for depression or mood disorders  The patients weight, height, BMI and visual acuity have been recorded in the chart I have made referrals, counseling and provided education to the patient based review of the above and I have provided the pt with a written personalized care plan for preventive services.  I have provided you with a copy of your personalized plan for preventive services. Please take the time to review along with your updated medication list.  Colon due next year Will recheck PSA Had tetanus and flu/COVID this fall Prefers no shingrix Does stay active Counseled on cigarette cessation
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
## 2023-09-12 NOTE — Assessment & Plan Note (Addendum)
Has had fair control with the jardiance Will work on getting it for him on assistance Duloxetine and gabapentin helps the neuropathy

## 2023-09-12 NOTE — Assessment & Plan Note (Signed)
Compensated with entresto 24/26 bid Keeps up with CHF clinic Also on jardiance

## 2023-09-13 ENCOUNTER — Encounter: Payer: Self-pay | Admitting: Internal Medicine

## 2023-09-16 ENCOUNTER — Encounter: Payer: Self-pay | Admitting: Pharmacist

## 2023-09-16 NOTE — Progress Notes (Addendum)
Manufacturer Assistance Program (MAP) Application    Manufacturer: [employer] (BI Cares)    (New enrollment) Medication(s): Jardiance  Patient Portion of Application:  2/3: Filled out and uploaded to clinic eFax folder for patient signature in front office. Patient would like to sign this afternoon Income Documentation: Patient preference to drop off copy of income documentation at clinic front desk. We discussed using SSI Letter as income.  Reports household 1, 1100-1200/month via SS.   Provider Portion of Application:  2/3:  Filled out by pharmD, attached to patient pages Prescription(s): Included in MAP application.  Application Status:  APPROVED through 08/12/2024    Next Steps: [x]    Patient signature (plans to sign afternoon of 09/16/23) [x]    Upon patient signature, application to be placed in Letvak's Folder for his signature [x]    PCP signature [x]    Upon PCP signature, Application to be faxed to [employer]: [phone] with copy of patient's insurance card AND income document + scanned into patient chart: 09/20/23 Application faxed and sent to scanning by CMA [x]    Program approval/denial received via fax and documented in chart  Forwarded to [facility] CPhT Patient Advocate Team for future correspondences/re-enrollment.  Note routed to PCP Clinic Pool to ensure PCP signature is obtained and application is faxed.  *[facility] clinic team - Please Addend/update this note as the "Next Steps" are completed in office*

## 2023-09-17 NOTE — Progress Notes (Signed)
Given to Dr Alphonsus Sias for signature.  Manufacturer Assistance Program (MAP) Application    Manufacturer: [employer] (BI Cares)    (New enrollment) Medication(s): Jardiance  Patient Portion of Application:  2/3: Filled out and uploaded to clinic eFax folder for patient signature in front office. Patient would like to sign this afternoon Income Documentation: Patient preference to drop off copy of income documentation at clinic front desk. We discussed using SSI Letter as income.  Reports household 1, 1100-1200/month via SS.   Provider Portion of Application:  2/3:  Filled out by pharmD, attached to patient pages Prescription(s): Included in MAP application.  Application Status: Not submitted (pending signatures)  Next Steps: []    Patient signature (plans to sign afternoon of 09/16/23) []    Upon patient signature, application to be placed in [facility]'s Folder for his signature [x]    PCP signature []    Upon PCP signature, Application to be faxed to [employer]: [phone] with copy of patient's insurance card AND income document + scanned into patient chart []    Program approval/denial received via fax and documented in chart  Forwarded to [facility] CPhT Patient Advocate Team for future correspondences/re-enrollment.  Note routed to PCP Clinic Pool to ensure PCP signature is obtained and application is faxed.  *[facility] clinic team - Please Addend/update this note as the "Next Steps" are completed in office*

## 2023-09-24 ENCOUNTER — Ambulatory Visit: Payer: Medicare Other | Admitting: Internal Medicine

## 2023-10-01 ENCOUNTER — Ambulatory Visit: Payer: Medicare Other | Admitting: Internal Medicine

## 2023-10-04 ENCOUNTER — Telehealth: Payer: Self-pay

## 2023-10-04 ENCOUNTER — Other Ambulatory Visit: Payer: Self-pay | Admitting: Internal Medicine

## 2023-10-04 NOTE — Telephone Encounter (Signed)
-----   Message from Loree Fee sent at 10/03/2023 12:23 PM EST ----- So sorry! Yes BI Cares ----- Message ----- From: Earnest Conroy, CPhT Sent: 10/03/2023  10:40 AM EST To: Loree Fee, RPH  Sorry is this approval for [employer] OR NOVO THE LETTER THAT IS ATTACHED IS [employer]ICARES ----- Message ----- From: Loree Fee, [facility] Sent: 10/03/2023   9:49 AM EST To: Rx Med Assistance Team  FYI - Fax received in clinic - Novo APPROVAL ----- Message ----- From: Ardeth Sportsman Sent: 09/30/2023   9:52 AM EST To: Loree Fee, RPH

## 2023-10-04 NOTE — Telephone Encounter (Signed)
PAP: Patient assistance application for [place] has been approved by PAP Companies: BICARES from 09/16/2023 to 08/12/2024. Medication should be delivered to PAP Delivery: Home. For further shipping updates, please contact [employer] (BI Cares) at [phone]. Patient ID is: NO ID   PLEASE BE ADVISED

## 2023-11-25 ENCOUNTER — Telehealth: Payer: Self-pay | Admitting: Family

## 2023-11-25 NOTE — Telephone Encounter (Signed)
 Spoke with patient. Pt needs to reapply for [employer] pt appliaction. He will come to clinic on 4/15 to pick up entresto sample and fill out his portion of pt assistance application and bring financial documents.

## 2023-11-26 ENCOUNTER — Telehealth: Payer: Self-pay

## 2023-11-26 NOTE — Telephone Encounter (Signed)
 Pt came to clinic and brought proof of income documents for [employer]vartis re-enrollment application. Application filled out, signed by provider and faxed to [employer] with POF document. Pt given entresto samples for a bridge while he waits for confirmation on re-enrollment status.  Medication Samples have been provided to the patient.  Drug name: Viola Greulich       Strength: 24-26 MG        Qty: 2   LOT #1: [id number]       Exp.Date: 06/12/2025 LOT # 2: [id number]     Exp. Date: 08/12/2025  Dosing instructions: Take one tablet by mouth two times daily.  The patient has been instructed regarding the correct time, dose, and frequency of taking this medication, including desired effects and most common side effects.   Jazariah Teall K Lanesha Azzaro 1:06 PM 11/26/2023

## 2023-12-02 ENCOUNTER — Other Ambulatory Visit: Payer: Self-pay | Admitting: Neurology

## 2023-12-11 ENCOUNTER — Telehealth: Payer: Self-pay | Admitting: Pharmacist

## 2023-12-11 ENCOUNTER — Other Ambulatory Visit: Payer: Self-pay

## 2023-12-11 MED ORDER — SACUBITRIL-VALSARTAN 24-26 MG PO TABS
1.0000 | ORAL_TABLET | Freq: Two times a day (BID) | ORAL | 3 refills | Status: AC
  Filled 2023-12-11: qty 180, 90d supply, fill #0
  Filled 2024-03-31: qty 180, 90d supply, fill #1
  Filled 2024-07-03: qty 180, 90d supply, fill #2

## 2023-12-11 MED ORDER — SACUBITRIL-VALSARTAN 24-26 MG PO TABS
1.0000 | ORAL_TABLET | Freq: Two times a day (BID) | ORAL | 3 refills | Status: DC
Start: 2023-12-11 — End: 2023-12-11

## 2023-12-11 MED ORDER — EMPAGLIFLOZIN 10 MG PO TABS
10.0000 mg | ORAL_TABLET | Freq: Every day | ORAL | 2 refills | Status: DC
  Filled 2023-12-11: qty 90, 90d supply, fill #0
  Filled 2023-12-11: qty 30, 30d supply, fill #0
  Filled 2023-12-11: qty 90, 90d supply, fill #0

## 2023-12-11 NOTE — Telephone Encounter (Signed)
 Patient called for difficulty affording Entresto  and Jardiance . Applied for and approved for [employer]:  ID: [id number] BIN: [id number] PCN: [id number] Group: [id number]  Medications sent to [facility] pharmacy.

## 2024-02-17 ENCOUNTER — Telehealth: Payer: Self-pay | Admitting: Neurology

## 2024-02-17 ENCOUNTER — Ambulatory Visit: Payer: Medicare Other | Admitting: Neurology

## 2024-02-17 NOTE — Telephone Encounter (Signed)
 Pt called in to reschedule his appointment for tomorrow. He wanted me to tell Dr. Tobie his is doing well and will see her at his next appointment 03/24/24. He does not need a call back.

## 2024-02-18 ENCOUNTER — Ambulatory Visit: Admitting: Neurology

## 2024-03-24 ENCOUNTER — Ambulatory Visit: Admitting: Neurology

## 2024-03-24 VITALS — BP 163/72 | HR 87 | Resp 20 | Wt 172.0 lb

## 2024-03-24 DIAGNOSIS — R202 Paresthesia of skin: Secondary | ICD-10-CM

## 2024-03-24 DIAGNOSIS — L600 Ingrowing nail: Secondary | ICD-10-CM

## 2024-03-24 MED ORDER — GABAPENTIN 300 MG PO CAPS: 300.0000 mg | ORAL_CAPSULE | Freq: Every day | ORAL | 3 refills | Status: DC

## 2024-03-24 MED ORDER — DULOXETINE HCL 30 MG PO CPEP: 30.0000 mg | ORAL_CAPSULE | Freq: Two times a day (BID) | ORAL | 3 refills | Status: AC
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
## 2024-03-31 ENCOUNTER — Other Ambulatory Visit: Payer: Self-pay

## 2024-05-25 NOTE — Progress Notes (Signed)
 Kyle Ramsey                                          MRN: [id number]   05/25/2024   The [facility] Quality Team Specialist reviewed this patient medical record for the purposes of chart review for care gap closure. The following were reviewed: chart review for care gap closure-diabetic eye exam and glycemic status assessment. A1c out of range at 11.6.    [facility]BCI Quality Team

## 2024-06-16 ENCOUNTER — Encounter: Admitting: Nurse Practitioner

## 2024-06-23 ENCOUNTER — Encounter

## 2024-06-24 ENCOUNTER — Telehealth: Payer: Self-pay

## 2024-06-24 NOTE — Telephone Encounter (Signed)
 Gave pt a call pt is coming up due for re-enrollment for 2026 on [place] Jardiance  left a HIPAA VM.

## 2024-06-29 NOTE — Progress Notes (Signed)
 Kyle Ramsey                                          MRN: [id number]   06/29/2024   The [facility] Quality Team Specialist reviewed this patient medical record for the purposes of chart review for care gap closure. The following were reviewed: chart review for care gap closure-diabetic eye exam and glycemic status assessment. A1c was 11.6.    [facility]BCI Quality Team

## 2024-07-03 ENCOUNTER — Other Ambulatory Visit: Payer: Self-pay

## 2024-07-30 NOTE — Telephone Encounter (Signed)
 Per spreadsheet notes pt should received at pharmacy at $0 copay,pt does not need PAP.

## 2024-07-30 NOTE — Telephone Encounter (Signed)
 Left 2nd HIPAA VM,mail out pap and faxed provide prortion.

## 2024-08-03 NOTE — Progress Notes (Signed)
 Kyle Ramsey                                          MRN: [id number]   08/03/2024   The [facility] Quality Team Specialist reviewed this patient medical record for the purposes of chart review for care gap closure. The following were reviewed: chart review for care gap closure-diabetic eye exam and glycemic status assessment.    VBCI Quality Team

## 2024-08-14 ENCOUNTER — Ambulatory Visit

## 2024-08-14 VITALS — BP 136/68 | HR 85 | Temp 98.7°F | Ht 66.5 in | Wt 171.0 lb

## 2024-08-14 DIAGNOSIS — Z720 Tobacco use: Secondary | ICD-10-CM | POA: Diagnosis not present

## 2024-08-14 DIAGNOSIS — E1142 Type 2 diabetes mellitus with diabetic polyneuropathy: Secondary | ICD-10-CM

## 2024-08-14 DIAGNOSIS — G629 Polyneuropathy, unspecified: Secondary | ICD-10-CM

## 2024-08-14 DIAGNOSIS — R202 Paresthesia of skin: Secondary | ICD-10-CM

## 2024-08-14 DIAGNOSIS — Z125 Encounter for screening for malignant neoplasm of prostate: Secondary | ICD-10-CM | POA: Diagnosis not present

## 2024-08-14 DIAGNOSIS — Z113 Encounter for screening for infections with a predominantly sexual mode of transmission: Secondary | ICD-10-CM | POA: Diagnosis not present

## 2024-08-14 DIAGNOSIS — E7849 Other hyperlipidemia: Secondary | ICD-10-CM | POA: Diagnosis not present

## 2024-08-14 LAB — POCT GLYCOSYLATED HEMOGLOBIN (HGB A1C): Hemoglobin A1C: 9.6 % — AB (ref 4.0–5.6)

## 2024-08-14 MED ORDER — EMPAGLIFLOZIN 10 MG PO TABS: 20.0000 mg | ORAL_TABLET | Freq: Every day | ORAL | Status: AC

## 2024-08-14 MED ORDER — NICOTINE 21 MG/24HR TD PT24: 21.0000 mg | MEDICATED_PATCH | Freq: Every day | TRANSDERMAL | 0 refills | Status: AC

## 2024-08-14 MED ORDER — NICOTINE POLACRILEX 4 MG MT GUM: 4.0000 mg | CHEWING_GUM | OROMUCOSAL | 0 refills | Status: AC | PRN

## 2024-08-14 MED ORDER — GABAPENTIN 300 MG PO CAPS: 300.0000 mg | ORAL_CAPSULE | Freq: Every evening | ORAL | Status: AC | PRN

## 2024-08-14 NOTE — Progress Notes (Signed)
 "  Subjective:   This visit was conducted in person. The patient gave informed consent to the use of Abridge AI technology to record the contents of the encounter as documented below.   Patient ID: Kyle Ramsey, male    DOB: 12-20-1955, 69 y.o.   MRN: [id number]   Discussed the use of AI scribe software for clinical note transcription with the patient, who gave verbal consent to proceed.  History of Present Illness Kyle Ramsey is a 69 year old male who presents for smoking cessation and diabetes management.  He has been smoking for over 40 years, currently about half a pack a day. Previous attempts to quit using willpower and Chantix were unsuccessful due to personal stressors such as separation and divorce. He is open to using nicotine  patches and gum to aid in quitting smoking. 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  He has a history of diabetes with an A1c of 11.6% eleven months ago. He has made dietary changes and consuming beer two to three times a week, his current A1c is 9.6%. He is on Jardiance  but has had adverse reactions to metformin and glipizide, including severe nausea and skin discoloration. He is cautious about medication side effects and prefers minimal medication use.  He experiences neuropathy pain, primarily in his feet, attributed to diabetes. He takes gabapentin  and Cymbalta , with Cymbalta  providing significant relief. Occasional tingling and burning in his feet are generally well-controlled with his current regimen.  He has a history of high blood pressure and heart failure and takes Entresto  twice daily. He also uses budesonide  for colitis, taking it as needed to manage diarrhea symptoms.  His social history includes a long history of smoking and occupational exposure to dust and dirt. He is retired, lives in [place], and prefers local medical procedures. He has high cholesterol but is not on medication due to  concerns about side effects. He takes multivitamins and beet powder, which he believes helps with his blood pressure.  No significant anxiety or depression. Occasional muscle aches and balance issues attributed to neuropathy. He has a history of kidney stones but no recent issues. No use of recreational drugs or other tobacco products besides cigarettes.  Currently smokes: 10 cigarettes  Age started smoking: 39 years  Reason for staring to smoke: Peer pressure  No. of cigarettes smoked per day: 10 cigs  Past quit attempts, interventions, reasons for relapse: Relapsed due to divorce, quit cold turkey  Harmful effects to health discussed:   Readiness to quit: 8/10  Co-existing mood disorder and treatment: Maybe anxiety   LDCT: Ordered  Influenza vaccine: UTD  Pneumococcal vaccine: UTD    Review of Systems  All other systems reviewed and are negative.       Allergies[1]  Medications Ordered Prior to Encounter[2]  BP 136/68 (BP Location: Right Arm, Patient Position: Sitting, Cuff Size: Large)   Pulse 85   Temp 98.7 F (37.1 C) (Oral)   Ht 5' 6.5 (1.689 m)   Wt 171 lb (77.6 kg)   SpO2 97%   BMI 27.19 kg/m   Objective:      Physical Exam MEASUREMENTS: Weight- 160. GENERAL: Alert, cooperative, well developed, no acute distress. HEAD: Normocephalic atraumatic. EYES: Extraocular movements intact BL, pupils round, equal and reactive to light BL, conjunctivae normal BL. EARS: Tympanic membrane, ear canal and external ear normal BL. NOSE: No congestion or rhinorrhea, mucous membranes are moist. THROAT: No oropharyngeal exudate or posterior oropharyngeal  erythema. CARDIOVASCULAR: Normal heart rate and rhythm, S1 and S2 normal without murmurs. CHEST: Clear to auscultation bilaterally, no wheezes, rhonchi, or crackles. ABDOMEN: Soft, non tender, non distended, without organomegaly, normal bowel sounds. EXTREMITIES: No cyanosis or edema. NEUROLOGICAL: Oriented to  person, place and time, no gait abnormalities, moves all extremities without gross motor or sensory deficit.         Assessment & Plan:   Assessment & Plan Tobacco dependence Chronic tobacco dependence with reduced smoking to half a pack per day. Discussed risks of continued smoking and benefits of early detection through annual CT scans. Open to gradual reduction. 5As discussed below. - Ordered annual chest CT scan for lung cancer screening. - Prescribed nicotine  patches (21 mg) and gum. - Encouraged gradual reduction in smoking.  Asked about tobacco use, patient confirmed using cigarettes Advised to quit given Risk of MI, stroke, various cancers Assessed readiness to quit, willing to make a quit attempt at this time  Assisting him to quit with the following plan: Daily use of nicotine  patches and gum Arranged scheduled follow-up in 4wks to monitor progress  Type 2 diabetes mellitus with diabetic polyneuropathy Poorly controlled diabetes with A1c of 9.6, improved from 11 prior. Neuropathy managed with gabapentin  and duloxetine . Emphasized dietary changes for glycemic control. - Increased Jardiance  to 20 mg daily. - Encouraged dietary modifications to reduce sugar and processed food intake. - Scheduled follow-up in 3 months for diabetes management and repeat A1c.  Hyperlipidemia Elevated LDL cholesterol. Discussed benefits of cholesterol management in reducing cardiovascular risk, especially with diabetes. Declines statin. - Ordered fasting lipid panel. - Discussed potential benefits and side effects of cholesterol-lowering medications.  General health maintenance Up to date on vaccinations. Discussed importance of regular screenings and preventive care. - Ordered HIV and hepatitis C screening. - Scheduled follow-up in 4 weeks for physical exam and fasting labs.    Return in about 4 weeks (around 09/11/2024) for CPE and smoking, fasting labs 1 wk prior and 3 months from now  diabetic visit .   Halim Surrette K Jeralyn Nolden, MD  08/14/2024     Contains text generated by Abridge.        [1]  Allergies Allergen Reactions   Losartan  Shortness Of Breath   Azithromycin Other (See Comments)   Codeine Nausea And Vomiting   Erythromycin Other (See Comments)    Gall bladder problem   Glipizide Other (See Comments)   Metformin Hcl Er Other (See Comments)   Onion Other (See Comments)  [2]  Current Outpatient Medications on File Prior to Visit  Medication Sig Dispense Refill   budesonide  (ENTOCORT EC ) 3 MG 24 hr capsule TAKE 3  BY MOUTH ONCE DAILY 90 capsule 3   DULoxetine  (CYMBALTA ) 30 MG capsule Take 1 capsule (30 mg total) by mouth 2 (two) times daily. 180 capsule 3   Multiple Vitamin (MULTIVITAMIN WITH MINERALS) TABS tablet Take 1 tablet by mouth daily.     OVER THE COUNTER MEDICATION Super Beets powder     sacubitril -valsartan  (ENTRESTO ) 24-26 MG Take 1 tablet by mouth 2 (two) times daily. 180 tablet 3   No current facility-administered medications on file prior to visit.   "

## 2024-08-14 NOTE — Progress Notes (Signed)
 Prevnar 20 in 06-28-21 Flu shot 05-30-24 RSV Shot 06-25-23 Tdap 05-25-23

## 2024-08-14 NOTE — Patient Instructions (Addendum)
 Thank you for visiting [facility] today! Here's what we talked about: - Call Imaging at [facility] regional if you do not hear from them in 2 weeks  - START taking two tablets of Jardiance  daily - START nicotine patches and gum, try to smoke fewer cigarettes day by day

## 2024-08-19 ENCOUNTER — Ambulatory Visit: Payer: Self-pay

## 2024-08-31 NOTE — Progress Notes (Signed)
 Kyle Ramsey                                          MRN: [id number]   08/31/2024   The [facility] Quality Team Specialist reviewed this patient medical record for the purposes of chart review for care gap closure. The following were reviewed: chart review for care gap closure-glycemic status assessment.    [facility] Quality Team

## 2025-03-29 ENCOUNTER — Ambulatory Visit: Admitting: Neurology
